# Patient Record
Sex: Female | Born: 1965 | Race: Black or African American | Hispanic: No | Marital: Married | Smoking: Never smoker
Health system: Southern US, Community
[De-identification: ages and names within clinical notes are randomized; demographics above are authoritative.]

## PROBLEM LIST (undated history)

## (undated) DIAGNOSIS — E669 Obesity, unspecified: Principal | ICD-10-CM

## (undated) DIAGNOSIS — I10 Essential (primary) hypertension: Secondary | ICD-10-CM

## (undated) DIAGNOSIS — R7611 Nonspecific reaction to tuberculin skin test without active tuberculosis: Secondary | ICD-10-CM

## (undated) DIAGNOSIS — E1169 Type 2 diabetes mellitus with other specified complication: Principal | ICD-10-CM

## (undated) DIAGNOSIS — E039 Hypothyroidism, unspecified: Secondary | ICD-10-CM

## (undated) DIAGNOSIS — T7840XA Allergy, unspecified, initial encounter: Secondary | ICD-10-CM

## (undated) HISTORY — DX: Hypothyroidism, unspecified: E03.9

## (undated) HISTORY — DX: Nonspecific reaction to tuberculin skin test without active tuberculosis: R76.11

## (undated) HISTORY — DX: Essential (primary) hypertension: I10

## (undated) HISTORY — DX: Allergy, unspecified, initial encounter: T78.40XA

## (undated) HISTORY — DX: Type 2 diabetes mellitus with other specified complication: E11.69

## (undated) HISTORY — DX: Obesity, unspecified: E66.9

---

## 1993-02-13 HISTORY — PX: TONSILLECTOMY: SUR1361

## 1997-02-13 HISTORY — PX: CHOLECYSTECTOMY: SHX55

## 1998-04-29 ENCOUNTER — Other Ambulatory Visit: Admission: RE | Admit: 1998-04-29 | Discharge: 1998-04-29 | Payer: Self-pay | Admitting: *Deleted

## 1999-07-12 ENCOUNTER — Other Ambulatory Visit: Admission: RE | Admit: 1999-07-12 | Discharge: 1999-07-12 | Payer: Self-pay | Admitting: *Deleted

## 2000-07-18 ENCOUNTER — Other Ambulatory Visit: Admission: RE | Admit: 2000-07-18 | Discharge: 2000-07-18 | Payer: Self-pay | Admitting: *Deleted

## 2001-07-15 ENCOUNTER — Other Ambulatory Visit: Admission: RE | Admit: 2001-07-15 | Discharge: 2001-07-15 | Payer: Self-pay | Admitting: *Deleted

## 2002-09-08 ENCOUNTER — Other Ambulatory Visit: Admission: RE | Admit: 2002-09-08 | Discharge: 2002-09-08 | Payer: Self-pay | Admitting: *Deleted

## 2003-11-20 ENCOUNTER — Emergency Department (HOSPITAL_COMMUNITY): Admission: EM | Admit: 2003-11-20 | Discharge: 2003-11-20 | Payer: Self-pay

## 2004-05-13 ENCOUNTER — Ambulatory Visit (HOSPITAL_COMMUNITY): Admission: RE | Admit: 2004-05-13 | Discharge: 2004-05-13 | Payer: Self-pay | Admitting: *Deleted

## 2005-10-13 ENCOUNTER — Encounter: Admission: RE | Admit: 2005-10-13 | Discharge: 2005-10-13 | Payer: Self-pay | Admitting: Obstetrics & Gynecology

## 2006-04-20 ENCOUNTER — Ambulatory Visit (HOSPITAL_COMMUNITY): Admission: RE | Admit: 2006-04-20 | Discharge: 2006-04-20 | Payer: Self-pay | Admitting: Internal Medicine

## 2006-10-24 ENCOUNTER — Encounter: Admission: RE | Admit: 2006-10-24 | Discharge: 2006-10-24 | Payer: Self-pay | Admitting: Obstetrics & Gynecology

## 2007-06-29 ENCOUNTER — Emergency Department (HOSPITAL_COMMUNITY): Admission: EM | Admit: 2007-06-29 | Discharge: 2007-06-29 | Payer: Self-pay | Admitting: Emergency Medicine

## 2007-11-19 ENCOUNTER — Encounter: Admission: RE | Admit: 2007-11-19 | Discharge: 2007-11-19 | Payer: Self-pay | Admitting: Obstetrics & Gynecology

## 2007-12-13 ENCOUNTER — Emergency Department (HOSPITAL_COMMUNITY): Admission: EM | Admit: 2007-12-13 | Discharge: 2007-12-13 | Payer: Self-pay | Admitting: Emergency Medicine

## 2008-08-02 ENCOUNTER — Emergency Department (HOSPITAL_COMMUNITY): Admission: EM | Admit: 2008-08-02 | Discharge: 2008-08-02 | Payer: Self-pay | Admitting: Emergency Medicine

## 2008-10-08 LAB — HM PAP SMEAR

## 2008-10-08 LAB — CONVERTED CEMR LAB: Pap Smear: NORMAL

## 2008-10-25 ENCOUNTER — Emergency Department (HOSPITAL_COMMUNITY): Admission: EM | Admit: 2008-10-25 | Discharge: 2008-10-25 | Payer: Self-pay | Admitting: Emergency Medicine

## 2008-11-30 LAB — HM MAMMOGRAPHY: HM Mammogram: NORMAL

## 2008-12-14 ENCOUNTER — Encounter: Admission: RE | Admit: 2008-12-14 | Discharge: 2008-12-14 | Payer: Self-pay | Admitting: Obstetrics & Gynecology

## 2008-12-30 ENCOUNTER — Ambulatory Visit: Payer: Self-pay | Admitting: Family Medicine

## 2008-12-30 DIAGNOSIS — M7500 Adhesive capsulitis of unspecified shoulder: Secondary | ICD-10-CM | POA: Insufficient documentation

## 2008-12-31 ENCOUNTER — Encounter: Payer: Self-pay | Admitting: Family Medicine

## 2008-12-31 DIAGNOSIS — I10 Essential (primary) hypertension: Secondary | ICD-10-CM | POA: Insufficient documentation

## 2008-12-31 DIAGNOSIS — J309 Allergic rhinitis, unspecified: Secondary | ICD-10-CM | POA: Insufficient documentation

## 2009-02-04 ENCOUNTER — Encounter: Payer: Self-pay | Admitting: Family Medicine

## 2009-02-08 ENCOUNTER — Ambulatory Visit: Payer: Self-pay | Admitting: Family Medicine

## 2009-04-05 ENCOUNTER — Encounter: Payer: Self-pay | Admitting: Family Medicine

## 2009-04-07 ENCOUNTER — Ambulatory Visit: Payer: Self-pay | Admitting: Family Medicine

## 2009-05-05 ENCOUNTER — Ambulatory Visit: Payer: Self-pay | Admitting: Family Medicine

## 2009-05-05 DIAGNOSIS — R5383 Other fatigue: Secondary | ICD-10-CM | POA: Insufficient documentation

## 2009-05-05 LAB — CONVERTED CEMR LAB
Free T4: 1 ng/dL
T4, Total: 7.2 ug/dL

## 2009-05-10 LAB — CONVERTED CEMR LAB
ALT: 17 units/L (ref 0–35)
AST: 18 units/L (ref 0–37)
Albumin: 4 g/dL (ref 3.5–5.2)
Alkaline Phosphatase: 74 units/L (ref 39–117)
BUN: 9 mg/dL (ref 6–23)
Basophils Absolute: 0.1 10*3/uL (ref 0.0–0.1)
Basophils Relative: 1.1 % (ref 0.0–3.0)
Bilirubin, Direct: 0.1 mg/dL (ref 0.0–0.3)
CO2: 28 meq/L (ref 19–32)
Calcium: 9.2 mg/dL (ref 8.4–10.5)
Chloride: 103 meq/L (ref 96–112)
Creatinine, Ser: 0.9 mg/dL (ref 0.4–1.2)
Direct LDL: 145.7 mg/dL
Eosinophils Absolute: 0.1 10*3/uL (ref 0.0–0.7)
Eosinophils Relative: 1.8 % (ref 0.0–5.0)
GFR calc non Af Amer: 87.6 mL/min (ref >60)
Glucose, Bld: 85 mg/dL (ref 70–99)
HCT: 41.5 % (ref 36.0–46.0)
Hemoglobin: 13.4 g/dL (ref 12.0–15.0)
Lymphocytes Relative: 44.5 % (ref 12.0–46.0)
Lymphs Abs: 2.7 10*3/uL (ref 0.7–4.0)
MCHC: 32.4 g/dL (ref 30.0–36.0)
MCV: 83.9 fL (ref 78.0–100.0)
Monocytes Absolute: 0.2 10*3/uL (ref 0.1–1.0)
Monocytes Relative: 2.8 % — ABNORMAL LOW (ref 3.0–12.0)
Neutro Abs: 3 10*3/uL (ref 1.4–7.7)
Neutrophils Relative %: 49.8 % (ref 43.0–77.0)
Platelets: 248 10*3/uL (ref 150.0–400.0)
Potassium: 4.4 meq/L (ref 3.5–5.1)
RBC: 4.95 M/uL (ref 3.87–5.11)
RDW: 12.6 % (ref 11.5–14.6)
Sodium: 140 meq/L (ref 135–145)
TSH: 0.29 microintl units/mL — ABNORMAL LOW (ref 0.35–5.50)
Total Bilirubin: 0.3 mg/dL (ref 0.3–1.2)
Total Protein: 7.5 g/dL (ref 6.0–8.3)
WBC: 6.1 10*3/uL (ref 4.5–10.5)

## 2009-05-18 ENCOUNTER — Encounter: Payer: Self-pay | Admitting: Family Medicine

## 2009-06-28 ENCOUNTER — Encounter: Payer: Self-pay | Admitting: Family Medicine

## 2009-08-18 ENCOUNTER — Ambulatory Visit: Payer: Self-pay | Admitting: Family Medicine

## 2009-08-18 DIAGNOSIS — J069 Acute upper respiratory infection, unspecified: Secondary | ICD-10-CM | POA: Insufficient documentation

## 2009-08-19 ENCOUNTER — Encounter: Payer: Self-pay | Admitting: Family Medicine

## 2009-10-12 ENCOUNTER — Emergency Department (HOSPITAL_COMMUNITY): Admission: EM | Admit: 2009-10-12 | Discharge: 2009-10-12 | Payer: Self-pay | Admitting: Family Medicine

## 2010-01-10 ENCOUNTER — Encounter: Admission: RE | Admit: 2010-01-10 | Discharge: 2010-01-10 | Payer: Self-pay | Admitting: Obstetrics & Gynecology

## 2010-01-14 ENCOUNTER — Ambulatory Visit: Payer: Self-pay | Admitting: Family Medicine

## 2010-03-17 NOTE — Assessment & Plan Note (Signed)
Summary: SINUS INFECTION/DLO   Vital Signs:  Patient profile:   45 year old female Height:      73 inches Weight:      258.2 pounds BMI:     34.19 Temp:     98.9 degrees F oral Pulse rate:   76 / minute Pulse rhythm:   regular BP sitting:   110 / 80  (left arm) Cuff size:   large  Vitals Entered By: Benny Lennert CMA Duncan Dull) (August 18, 2009 4:02 PM)  History of Present Illness: Chief complaint ? sinus infection  Acute Visit History:      The patient complains of headache, nasal discharge, sinus problems, and vomiting.  These symptoms began 2 days ago.  She denies chest pain, cough, earache, and fever.  Other comments include: Congestion right nostril  posyt nasal drip and nausea...clearing throat causes vomit of mucus.  using tylenol .        She complains of sinus pressure and nasal congestion.        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 Take One Tablet Once Daily 2)  Clarithromycin 500 Mg Tabs (Clarithromycin) .Marland Kitchen.. 1 Tab By Mouth Two Times A Day X 10 Days Fill If Not Improving in 3-4 Days.  Void After 09/13/2009  Allergies: 1)  ! Penicillin  Past History:  Past medical, surgical, family and social histories (including risk factors) reviewed, and no changes noted (except as noted below).  Past Medical History: Reviewed history from 12/30/2008 and no changes required. + PPD, has had 9 months of treatment Hypothyroid? Allergic rhinitis Hypertension  Past Surgical History: Reviewed history from 12/30/2008 and no changes required. Cholecystectomy, 1999 Tonsillectomy, 1995  Family History: Reviewed history from 12/30/2008 and no  changes required. Alcoholism, Drug Addiction: 0 Colon CA: 0 Ovarian/Uterine CA: 0 Breast CA: 0 Lung CA: 0 Prostate CA: 0 CAD: 0 CVA: GP Sudden death < 50: 0 DM: P, GP Mental Illness: 0   Social History: Reviewed history from 12/30/2008 and no changes required. Clerical accounting tech married  Review of Systems General:  Complains of fatigue; denies fever. CV:  Denies chest pain or discomfort. Resp:  Denies shortness of breath.  Physical Exam  General:  Obese appearing female inNAD  Head:  ttp B maxillary sinuses Ears:  External ear exam shows no significant lesions or deformities.  Otoscopic examination reveals clear canals, tympanic membranes are intact bilaterally without bulging, retraction, inflammation or discharge. Hearing is grossly normal bilaterally. Nose:  Nasal turbinates swollen, no purulent discharge.  Mouth:  Oral mucosa and oropharynx without lesions or exudates.  Teeth in good repair. Neck:  no carotid bruit or thyromegaly no cervical or supraclavicular lymphadenopathy  Lungs:  Normal respiratory effort, chest expands symmetrically. Lungs are clear to auscultation, no crackles or wheezes. Heart:  Normal rate and regular rhythm. S1 and S2 normal without gallop, murmur, click, rub or other extra sounds.   Impression & Recommendations:  Problem # 1:  URI (ICD-465.9) Treat symptomatically...with mucinex, nasal saline and tylenol.  Given she is going out of town..provided antibiotic Rx...if not improving..treat for bacterial sinus infection.  Complete Medication List: 1)  Benazepril Hcl 40 Mg  Tabs (Benazepril hcl) .... Take one tablet once daily 2)  Clarithromycin 500 Mg Tabs (Clarithromycin) .Marland Kitchen.. 1 tab by mouth two times a day x 10 days fill if not improving in 3-4 days.  void after 09/13/2009  Patient Instructions: 1)  Nasal saline  spray in nostril three times a day  2)  Avoid decongestants. 3)  Start mucinex (guafenesin  600- 1200mg   ) two times a day  . 4)  Tylenol  650 mg three times a day as needed for headache. 5)   Fill presention fro antibitocs if not improving in 5-7 days.  Prescriptions: CLARITHROMYCIN 500 MG TABS (CLARITHROMYCIN) 1 tab by mouth two times a day x 10 days Fill if not improving in 3-4 days.  VOID after 09/13/2009  #20 x 0   Entered and Authorized by:   Kerby Nora MD   Signed by:   Kerby Nora MD on 08/18/2009   Method used:   Print then Give to Patient   RxID:   [id number]   Current Allergies (reviewed today): ! PENICILLIN

## 2010-03-17 NOTE — Assessment & Plan Note (Signed)
Summary: ROA 2 MTHS CYD   Vital Signs:  Patient profile:   45 year old female Height:      73 inches Weight:      257.50 pounds BMI:     34.10 Temp:     98.5 degrees F oral Pulse rate:   76 / minute Pulse rhythm:   regular BP sitting:   122 / 80  (left arm) Cuff size:   large  Vitals Entered By: Linde Gillis CMA Duncan Dull) (April 07, 2009 3:34 PM) CC: 2 month follow up   History of Present Illness: Chief complaint f/u AC  f/u L adhesive capsulitis, moderately compliant with HEP  Doing some rehab twice a week and doing some HEP  Left shoulder, adhesive capsulitis: injured shoulder, but has intervally improved with PT and HEP  she has loss of range of motion in all directions in her affected LEFT shoulder.   Reviewed her HEP, not following the [facility] program that I gave her, but is doing some work a few times a week.   Allergies: 1)  ! Penicillin  Past History:  Past medical, surgical, family and social histories (including risk factors) reviewed, and no changes noted (except as noted below).  Past Medical History: Reviewed history from 12/30/2008 and no changes required. + PPD, has had 9 months of treatment Hypothyroid? Allergic rhinitis Hypertension  Past Surgical History: Reviewed history from 12/30/2008 and no changes required. Cholecystectomy, 1999 Tonsillectomy, 1995  Family History: Reviewed history from 12/30/2008 and no changes required. Alcoholism, Drug Addiction: 0 Colon CA: 0 Ovarian/Uterine CA: 0 Breast CA: 0 Lung CA: 0 Prostate CA: 0 CAD: 0 CVA: GP Sudden death < 50: 0 DM: P, GP Mental Illness: 0   Social History: Reviewed history from 12/30/2008 and no changes required. Production designer, theatre/television/film tech married  Review of Systems       REVIEW OF SYSTEMS  GEN: No systemic complaints, no fevers, chills, sweats, or other acute illnesses MSK: Detailed in the HPI GI: tolerating PO intake without difficulty Neuro: No numbness, parasthesias,  or tingling associated. Otherwise the pertinent positives of the ROS are noted above.    Physical Exam  General:  GEN: Well-developed,well-nourished,in no acute distress; alert,appropriate and cooperative throughout examination HEENT: Normocephalic and atraumatic without obvious abnormalities. No apparent alopecia or balding. Ears, externally no deformities PULM: Breathing comfortably in no respiratory distress EXT: No clubbing, cyanosis, or edema PSYCH: Normally interactive. Cooperative during the interview. Pleasant. Friendly and conversant. Not anxious or depressed appearing. Normal, full affect.  Msk:  RIGHT shoulder: full range of motion, strength intact, examination normal.  LEFT shoulder: Nontender along clavicle, nontender acromioclavicular joint. Nontender at the supraspinatus insertion. Nontender at the bicipital groove. No bruising, no edema.  loss of motion in all directions. Abduction to approximately 150. Loss of 80 of internal range of motion. Loss of 40 of external range of motion. Done at 90 of abduction   Impression & Recommendations:  Problem # 1:  FROZEN SHOULDER (ICD-726.0) Mild improvement from last time. Family sick with flu last week, minimal HEP  I reviewed with her daily HEP for frozen shoulder and reviewed the full [facility] program showing her all exercises. If no improvement in next office visit, consideration of manipulation under anesthesia?  Complete Medication List: 1)  Benazepril Hcl 40 Mg Tabs (Benazepril hcl) .... Take one tablet once daily  Current Allergies (reviewed today): ! PENICILLIN

## 2010-03-17 NOTE — Assessment & Plan Note (Signed)
Summary: CPX//CYD   Vital Signs:  Patient profile:   45 year old female Height:      73 inches Weight:      258.2 pounds BMI:     34.19 Temp:     98.5 degrees F oral Pulse rate:   76 / minute Pulse rhythm:   regular BP sitting:   118 / 78  (left arm) Cuff size:   large  Vitals Entered By: Benny Lennert CMA Duncan Dull) (May 05, 2009 2:38 PM)  History of Present Illness: Chief complaint cpx has gyn  Pap - done Breast exam - done up-to-date and done by gynecology  Mammo - up to date  Chol: Needs lab work Tdap - 2006  History of abnormal  TSH, repeat needed  L frozen shoulder: oonset since September, the patient did make some initial progress, but has had some  continued plateau  without improvement over the last couple months after she has been going to physical therapy and doing home exercise program. She has had one shoulder injection.  Weight loss: Likes to pay some softball, but not doing anything now.     Preventive Screening-Counseling & Management  Alcohol-Tobacco     Alcohol drinks/day: 0     Alcohol Counseling: not indicated; patient does not drink     Smoking Status: never     Tobacco Counseling: not indicated; no tobacco use  Caffeine-Diet-Exercise     Diet Counseling: to improve diet; diet is suboptimal     Does Patient Exercise: no     Exercise Counseling: to improve exercise regimen  Hep-HIV-STD-Contraception     STD Risk: no risk noted      Sexual History:  currently monogamous.        Drug Use:  never.    Allergies: 1)  ! Penicillin  Past History:  Past medical, surgical, family and social histories (including risk factors) reviewed, and no changes noted (except as noted below).  Past Medical History: Reviewed history from 12/30/2008 and no changes required. + PPD, has had 9 months of treatment Hypothyroid? Allergic rhinitis Hypertension  Past Surgical History: Reviewed history from 12/30/2008 and no changes  required. Cholecystectomy, 1999 Tonsillectomy, 1995  Family History: Reviewed history from 12/30/2008 and no changes required. Alcoholism, Drug Addiction: 0 Colon CA: 0 Ovarian/Uterine CA: 0 Breast CA: 0 Lung CA: 0 Prostate CA: 0 CAD: 0 CVA: GP Sudden death < 50: 0 DM: P, GP Mental Illness: 0   Social History: Reviewed history from 12/30/2008 and no changes required. 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  Cardiovascular: Denies chest pains, palpitations, syncope, dyspnea on exertion,  Respiratory: Denies cough, dyspnea at rest, excessive sputum,wheeezing GI: Denies nausea, vomiting, diarrhea, constipation, change in bowel habits, abdominal pain, melena, hematochezia GU: Denies dysuria, hematuria, discharge, urinary frequency, urinary hesitancy, nocturia, incontinence, genital sores, decreased libido Musculoskeletal: as above Derm: Denies rash, itching Neuro: Denies  paresthesias, frequent falls, frequent headaches, and difficulty walking.  Psych: Denies depression, anxiety Endocrine: Denies cold intolerance, heat intolerance, polydipsia, polyphagia, polyuria, and unusual weight change.  Heme: Denies enlarged lymph nodes Allergy: No hayfever   Otherwise, the pertinent positives and negatives are listed above and in the HPI, otherwise a full review  of systems has been reviewed and is negative unless noted positive.   Physical Exam  General:  Well-developed,well-nourished,in no acute distress; alert,appropriate and cooperative throughout examination Head:  Normocephalic and atraumatic without obvious abnormalities. No apparent alopecia or  balding. Eyes:  vision grossly intact, pupils equal, pupils round, pupils reactive to light, and pupils react to accomodation.   Ears:  External ear exam shows no significant lesions or deformities.  Otoscopic examination reveals clear canals, tympanic membranes are intact bilaterally without bulging, retraction, inflammation or discharge. Hearing is grossly normal bilaterally. Nose:  External nasal examination shows no deformity or inflammation. Nasal mucosa are pink and moist without lesions or exudates. Mouth:  Oral mucosa and oropharynx without lesions or exudates.  Teeth in good repair. Neck:  No deformities, masses, or tenderness noted. Chest Wall:  No deformities, masses, or tenderness noted. Lungs:  Normal respiratory effort, chest expands symmetrically. Lungs are clear to auscultation, no crackles or wheezes. Heart:  Normal rate and regular rhythm. S1 and S2 normal without gallop, murmur, click, rub or other extra sounds. Abdomen:  Bowel sounds positive,abdomen soft and non-tender without masses, organomegaly or hernias noted. Msk:  normal ROM and no crepitation.   Extremities:  No clubbing, cyanosis, edema, or deformity noted with normal full range of motion of all joints.   Neurologic:  alert & oriented X3, sensation intact to light touch, and gait normal.   Skin:  Intact without suspicious lesions or rashes Cervical Nodes:  No lymphadenopathy noted Psych:  Cognition and judgment appear intact. Alert and cooperative with normal attention span and concentration. No apparent delusions, illusions, hallucinations   Impression & Recommendations:  Problem # 1:  HEALTH MAINTENANCE EXAM (ICD-V70.0) The patient's preventative maintenance and recommended screening tests for an annual wellness exam were reviewed in full today. Brought up to date unless services declined.  Counselled on the importance of diet, exercise, and its role in overall health and mortality. The patient's FH and SH was  reviewed, including their home life, tobacco status, and drug and alcohol status.   adhesive capsulitis, the patient has affectively had a  failure of conservative management with a plateau  doing home exercise programs with frozen shoulder protocol from [facility],, aggressive physical therapy, and she is at one shoulder injection.  I think at this point, given the timeframe, and her plateau without any significant improvement and still with significant deficit in a young woman, orthopedic consult is appropriate. I appreciate their assistance in consideration for definitive management.  Complete Medication List: 1)  Benazepril Hcl 40 Mg Tabs (Benazepril hcl) .... Take one tablet once daily  Other Orders: Venipuncture (04540) TLB-TSH (Thyroid Stimulating Hormone) (84443-TSH) TLB-CBC Platelet - w/Differential (85025-CBCD) TLB-Hepatic/Liver Function Pnl (80076-HEPATIC) TLB-BMP (Basic Metabolic Panel-BMET) (80048-METABOL) TLB-Cholesterol, Direct LDL (83721-DIRLDL) Orthopedic Surgeon Referral (Ortho Surgeon)  Patient Instructions: 1)  Referral Appointment Information 2)  Day/Date: 3)  Time: 4)  Place/MD: 5)  Address: 6)  Phone/Fax: 7)  Patient given appointment information. Information/Orders faxed/mailed.  Prescriptions: BENAZEPRIL HCL 40 MG TABS (BENAZEPRIL HCL) take one tablet once daily  #30 x 11   Entered and Authorized by:   Hannah Beat MD   Signed by:   Hannah Beat MD on 05/05/2009   Method used:   Print then Give to Patient   RxID:   [id number]   Current Allergies (reviewed today): ! PENICILLIN  TD Result Date:  02/14/2004 TD Result:  given

## 2010-03-17 NOTE — Miscellaneous (Signed)
Summary: [facility] Note/[facility]  PT Note/[facility]   Imported By: Lanelle Bal 04/07/2009 13:20:33  _____________________________________________________________________  External Attachment:    Type:   Image     Comment:   External Document

## 2010-03-17 NOTE — Letter (Signed)
Summary: Out of Work  [employer] [employer] [facility] at [facility]  [address] [place], [place] [address]   Phone: [phone]  Fax: [phone]    January 14, 2010   Employee:  LEESA LEIFHEIT    To Whom It May Concern:   For Medical reasons, please excuse the above named employee from work from today until cough resolved.  Potentially contagious.   If you need additional information, please feel free to contact our office.         Sincerely,    Crawford Givens MD

## 2010-03-17 NOTE — Letter (Signed)
Summary: [facility]  [facility]   Imported By: Lanelle Bal 08/26/2009 11:52:39  _____________________________________________________________________  External Attachment:    Type:   Image     Comment:   External Document

## 2010-03-17 NOTE — Assessment & Plan Note (Signed)
Summary: CONGESTION RUNNY NOSE COUGH/MK   Vital Signs:  Patient profile:   45 year old female Height:      73 inches Weight:      263.75 pounds BMI:     34.92 Temp:     99 degrees F oral Pulse rate:   76 / minute Pulse rhythm:   regular BP sitting:   116 / 60  (left arm) Cuff size:   large  Vitals Entered By: Delilah Shan CMA Duncan Dull) (January 14, 2010 2:15 PM) CC: Congestion, runny nose, cough.   Needs note for work.   History of Present Illness: Started with rhinorrhea after shopping on Friday after Thanksgiving.  Was using cough drops with minimal relief.  Cough during the day, worse at night.   Episode of nausea early in this AM.  Had been vomiting early this AM, vomitus = drainage.  Dec in appetite.  Drink clear fluids.  Some sputum.  Some chills after vomiting. Not much ST.  No HA.    Had flu shot 10/2009.    Allergies: 1)  ! Penicillin 2)  ! Clarithromycin (Clarithromycin)  Social History: Geophysical data processor married Works at [employer]   Review of Systems       See HPI.  Otherwise negative.    Physical Exam  General:  GEN: nad, alert and oriented HEENT: mucous membranes moist, TM w/o erythema, nasal epithelium injected, OP with cobblestoning NECK: supple w/o LA CV: rrr. PULM: ctab, no inc wob ABD: soft, +bs EXT: no edema    Impression & Recommendations:  Problem # 1:  URI (ICD-465.9) Likely viral and no indication for antibiotics.  Supportive tx and follow up as needed.  Nontoxic.  She agrees.  Sedation caution for meds.  GI upset likely due to drainage that has been swallowed and should improve.   Her updated medication list for this problem includes:    Hydromet 5-1.5 Mg/14ml Syrp (Hydrocodone-homatropine) .Marland KitchenMarland KitchenMarland KitchenMarland Kitchen 5 ml by mouth q6h as needed for cough, sedation caution  Complete Medication List: 1)  Benazepril Hcl 40 Mg Tabs (Benazepril hcl) .... Take one tablet once daily 2)  Zofran 4 Mg Tabs (Ondansetron hcl) .Marland Kitchen.. 1 by mouth three  times a day as needed for nausea and vomiting 3)  Hydromet 5-1.5 Mg/58ml Syrp (Hydrocodone-homatropine) .... 5 ml by mouth q6h as needed for cough, sedation caution  Patient Instructions: 1)  Get plenty of rest, drink lots of clear liquids, and use Tylenol for fever and comfort. Use the cough medicine as needed- it can make you drowsy.  Use the zofran as needed for nausea.  Take care.  Prescriptions: HYDROMET 5-1.5 MG/5ML SYRP (HYDROCODONE-HOMATROPINE) 5 ml by mouth q6h as needed for cough, sedation caution  #6oz x 0   Entered and Authorized by:   Crawford Givens MD   Signed by:   Crawford Givens MD on 01/14/2010   Method used:   Print then Give to Patient   RxID:   [id number] ZOFRAN 4 MG TABS (ONDANSETRON HCL) 1 by mouth three times a day as needed for nausea and vomiting  #20 x 1   Entered and Authorized by:   Crawford Givens MD   Signed by:   Crawford Givens MD on 01/14/2010   Method used:   Print then Give to Patient   RxID:   [id number]    Orders Added: 1)  Est. Patient Level III [54008]    Current Allergies (reviewed today): ! PENICILLIN ! CLARITHROMYCIN (CLARITHROMYCIN)

## 2010-03-17 NOTE — Miscellaneous (Signed)
Summary: PT Initial Note/[facility]  PT Initial Note/[facility]    Imported By: Lanelle Bal 03/31/2009 12:48:01  _____________________________________________________________________  External Attachment:    Type:   Image     Comment:   External Document

## 2010-03-17 NOTE — Miscellaneous (Signed)
Summary: PT Discharge/[facility] [place]  PT Discharge/[facility] [place]   Imported By: Lanelle Bal 07/01/2009 10:21:21  _____________________________________________________________________  External Attachment:    Type:   Image     Comment:   External Document

## 2010-03-18 NOTE — Letter (Signed)
Summary: [facility]  [facility]   Imported By: Lanelle Bal 07/06/2009 09:44:09  _____________________________________________________________________  External Attachment:    Type:   Image     Comment:   External Document

## 2010-05-20 LAB — POCT URINALYSIS DIP (DEVICE)
Bilirubin Urine: NEGATIVE
Glucose, UA: NEGATIVE mg/dL
Ketones, ur: NEGATIVE mg/dL
Nitrite: NEGATIVE
Protein, ur: NEGATIVE mg/dL
Specific Gravity, Urine: 1.015 (ref 1.005–1.030)
Urobilinogen, UA: 0.2 mg/dL (ref 0.0–1.0)
pH: 7 (ref 5.0–8.0)

## 2010-06-09 ENCOUNTER — Ambulatory Visit (INDEPENDENT_AMBULATORY_CARE_PROVIDER_SITE_OTHER): Payer: Federal, State, Local not specified - PPO | Admitting: Family Medicine

## 2010-06-09 ENCOUNTER — Encounter: Payer: Self-pay | Admitting: Family Medicine

## 2010-06-09 VITALS — BP 126/94 | HR 88 | Temp 99.1°F | Wt 267.1 lb

## 2010-06-09 DIAGNOSIS — I10 Essential (primary) hypertension: Secondary | ICD-10-CM

## 2010-06-09 LAB — BASIC METABOLIC PANEL
BUN: 10 mg/dL (ref 6–23)
CO2: 26 mEq/L (ref 19–32)
Calcium: 9.4 mg/dL (ref 8.4–10.5)
Chloride: 104 mEq/L (ref 96–112)
Creatinine, Ser: 0.9 mg/dL (ref 0.4–1.2)
GFR: 90.64 mL/min (ref >60.00)
Glucose, Bld: 99 mg/dL (ref 70–99)
Potassium: 4.2 mEq/L (ref 3.5–5.1)
Sodium: 138 mEq/L (ref 135–145)

## 2010-06-09 MED ORDER — BENAZEPRIL HCL 40 MG PO TABS: 20.0000 mg | ORAL_TABLET | Freq: Every day | ORAL | Status: DC

## 2010-06-09 NOTE — Assessment & Plan Note (Addendum)
Dec the ACE by half and check BP at home.  Check bmet today and notify pt.  She has been exercising and she may just need less BP medicine.  D/w pt.  Okay for outpatient fu.  She is aware of category x for ACE in pregnancy.

## 2010-06-09 NOTE — Progress Notes (Signed)
For last few days her fingers have been swelling.  Happened on both hands.  Had felt a little lightheaded, intermittent, sometimes more noticeable after standing.  Doesn't feel like working out due to the lightheaded feeling.  She'll get nauseated with the lightheadedness.  Some swelling in ankles noted by patient.  No FCNAVD.  Not sob.  No CP.  No cough.  No syncope.  Drinking plenty of water.  Had checked BP pharmacy, but not recently.    Meds, vitals, and allergies reviewed.   ROS: See HPI.  Otherwise, noncontributory.  GEN: nad, alert and oriented HEENT: mucous membranes moist NECK: supple w/o LA CV: rrr PULM: ctab, no inc wob ABD: soft, +bs EXT: trace edema bilaterally SKIN: no acute rash

## 2010-06-09 NOTE — Patient Instructions (Signed)
Get a pill cutter at the pharmacy and take 1/2 tab a day of the benazepril.   Get a BP cuff and check your pressure.  Call back with an update on the swelling, lightheadedness, and BP readings next week. You can get your results through our phone system.  Follow the instructions on the blue card. Glad to see you today.

## 2010-07-25 ENCOUNTER — Other Ambulatory Visit: Payer: Self-pay | Admitting: Family Medicine

## 2010-08-15 ENCOUNTER — Encounter: Payer: Self-pay | Admitting: *Deleted

## 2010-08-15 ENCOUNTER — Encounter: Payer: Self-pay | Admitting: Family Medicine

## 2010-08-15 ENCOUNTER — Ambulatory Visit (INDEPENDENT_AMBULATORY_CARE_PROVIDER_SITE_OTHER): Payer: Federal, State, Local not specified - PPO | Admitting: Family Medicine

## 2010-08-15 VITALS — BP 140/80 | HR 88 | Temp 99.0°F | Ht 73.0 in | Wt 264.1 lb

## 2010-08-15 DIAGNOSIS — J069 Acute upper respiratory infection, unspecified: Secondary | ICD-10-CM

## 2010-08-15 MED ORDER — BENZONATATE 100 MG PO CAPS: 100.0000 mg | ORAL_CAPSULE | Freq: Three times a day (TID) | ORAL | Status: AC | PRN

## 2010-08-15 MED ORDER — HYDROCODONE-HOMATROPINE 5-1.5 MG/5ML PO SYRP: ORAL_SOLUTION | ORAL | Status: AC

## 2010-08-15 NOTE — Patient Instructions (Signed)
Upper Respiratory Infection -Viral Infections  TREATMENT 1. Drink plenty of fluids, but limit caffeine 2. Decongestant: for congested noses, sinuses, and ear tubes. Pressure release and help drainage: Sudafed (pseudephedrine or Phenylephrine) (NOT IF YOU HAVE HIGH BLOOD PRESSURE) 3. Nasal Sprays: Relieve pressure, promote drainage, open nasal and ear passages. Afrin or Neosynephrine can be used for only 3-4 days in row. 4. Nasal Saline: Moisten and smooth membranes, no side effects 5. Cough Suppressants: Several types over the counter such as DM. Codeine and Hydrocodone (Like the Hycodan I am giving you) are prescription narcotic medicines that are powerful cough suppressants. DM cough suppressant usually are well tolerated with minimal side effects Tessalon perles during day for cough and numbs throat some  Chloraseptic if throat sore  YOUR BODY HAS TO HEAL ITSELF.

## 2010-08-15 NOTE — Progress Notes (Signed)
Patent presents with runny nose, sneezing, cough, sore throat, malaise and minimal / low-grade fever .   At [place] last week: recent exposure to others with similar symptoms.   The patent denies sore throat as the primary complaint. Denies sthortness of breath/wheezing, high fever, chest pain, rhinits for more than 14 days, significant myalgia, otalgia, facial pain, abdominal pain, changes in bowel or bladder.  PMH, PHS, Allergies, Problem List, Medications, Family History, and Social History have all been reviewed.  ROS: as above, eating and drinking - tolerating PO. Urinating normally. No excessive vomitting or diarrhea. O/w as above.  PHYSICAL EXAM  Blood pressure 140/80, pulse 88, temperature 99 F (37.2 C), temperature source Oral, height 6\' 1"  (1.854 m), weight 264 lb 1.9 oz (119.804 kg), SpO2 98.00%.  PE: GEN: WDWN, Non-toxic, Atraumatic, normocephalic. A and O x 3. HEENT: Oropharynx clear without exudate, MMM, no significant LAD, mild rhinnorhea Ears: TM clear, COL visualized with good landmarks CV: RRR, no m/g/r. Pulm: CTA B, no wheezes, rhonchi, or crackles, normal respiratory effort. EXT: no c/c/e Psych: well oriented, neither depressed nor anxious in appearance  A/P: 1. URI. Supportive care reviewed with patient. See patient instruction section.

## 2010-10-27 ENCOUNTER — Other Ambulatory Visit: Payer: Self-pay | Admitting: Family Medicine

## 2010-11-09 LAB — POCT URINALYSIS DIP (DEVICE)
Bilirubin Urine: NEGATIVE
Glucose, UA: NEGATIVE
Ketones, ur: NEGATIVE
Nitrite: NEGATIVE
Operator id: 282151
Protein, ur: 30 — AB
Specific Gravity, Urine: 1.01
Urobilinogen, UA: 0.2
pH: 7

## 2010-12-27 ENCOUNTER — Other Ambulatory Visit: Payer: Self-pay | Admitting: Obstetrics & Gynecology

## 2010-12-27 DIAGNOSIS — Z1231 Encounter for screening mammogram for malignant neoplasm of breast: Secondary | ICD-10-CM

## 2011-01-06 ENCOUNTER — Ambulatory Visit (INDEPENDENT_AMBULATORY_CARE_PROVIDER_SITE_OTHER): Payer: Federal, State, Local not specified - PPO | Admitting: Family Medicine

## 2011-01-06 ENCOUNTER — Encounter: Payer: Self-pay | Admitting: Family Medicine

## 2011-01-06 VITALS — BP 130/72 | HR 103 | Temp 98.8°F | Ht 74.0 in | Wt 275.4 lb

## 2011-01-06 DIAGNOSIS — J321 Chronic frontal sinusitis: Secondary | ICD-10-CM

## 2011-01-06 MED ORDER — LEVOFLOXACIN 500 MG PO TABS: 500.0000 mg | ORAL_TABLET | Freq: Every day | ORAL | Status: AC

## 2011-01-06 NOTE — Progress Notes (Signed)
  Patient Name: Carrie Ellis Date of Birth: 06/28/1965 Age: 45 y.o. Medical Record Number: [id number] Gender: female  History of Present Illness:  Carrie Ellis is a 45 y.o. very pleasant female patient who presents with the following:  Sinuses are draining a lot. Starting on Wednesday. Not breathing all that great. Left nostril was closed shut. As the day progressed had some let sided headace.  Fontal headache. Later on in the day, pounding headache. Now ear is hurting. Did some midnight shopping.   Left-sided frontal pain primarily, without any significant maxillary pain. She has had some purulent discharge. No bloody discharge. No significant tooth pain, but she also is having some pain in her year, all on the LEFT side.  Past Medical History, Surgical History, Social History, Family History, and Problem List have been reviewed in EHR and updated if relevant.  Review of Systems: ROS: GEN: Acute illness details above GI: Tolerating PO intake GU: maintaining adequate hydration and urination Pulm: No SOB Interactive and getting along well at home.  Otherwise, ROS is as per the HPI.   Physical Examination: Filed Vitals:   01/06/11 1239  BP: 130/72  Pulse: 103  Temp: 98.8 F (37.1 C)  TempSrc: Oral  Height: 6\' 2"  (1.88 m)  Weight: 275 lb 6.4 oz (124.921 kg)  SpO2: 100%     Gen: WDWN, NAD; alert,appropriate and cooperative throughout exam  HEENT: Normocephalic and atraumatic. Throat clear, w/o exudate, no LAD, R TM clear, L TM - good landmarks, No fluid present. rhinnorhea.  Left frontal and maxillary sinuses: Tender frontal Right frontal and maxillary sinuses: non-Tender  Neck: No ant or post LAD CV: RRR, No M/G/R Pulm: Breathing comfortably in no resp distress. no w/c/r Abd: S,NT,ND,+BS Extr: no c/c/e Psych: full affect, pleasant   Assessment and Plan: 1. Frontal sinusitis  levofloxacin (LEVAQUIN) 500 MG tablet    Acute sinusitis: ABX as  below.  Refer to the patient instructions sections for details of plan shared with patient.  Reviewed symptomatic care as well as ABX in this case.

## 2011-01-14 LAB — HM MAMMOGRAPHY: HM Mammogram: NORMAL

## 2011-01-19 ENCOUNTER — Ambulatory Visit
Admission: RE | Admit: 2011-01-19 | Discharge: 2011-01-19 | Disposition: A | Discharge status: Home / Self Care | Payer: Federal, State, Local not specified - PPO | Source: Ambulatory Visit | Attending: Obstetrics & Gynecology | Admitting: Obstetrics & Gynecology

## 2011-01-19 DIAGNOSIS — Z1231 Encounter for screening mammogram for malignant neoplasm of breast: Secondary | ICD-10-CM

## 2011-02-11 ENCOUNTER — Other Ambulatory Visit: Payer: Self-pay | Admitting: Family Medicine

## 2011-03-15 ENCOUNTER — Other Ambulatory Visit: Payer: Self-pay | Admitting: Family Medicine

## 2011-05-29 ENCOUNTER — Ambulatory Visit (INDEPENDENT_AMBULATORY_CARE_PROVIDER_SITE_OTHER): Payer: Federal, State, Local not specified - PPO | Admitting: Family Medicine

## 2011-05-29 ENCOUNTER — Encounter: Payer: Self-pay | Admitting: Family Medicine

## 2011-05-29 VITALS — BP 140/90 | HR 99 | Temp 98.9°F | Ht 73.0 in | Wt 271.8 lb

## 2011-05-29 DIAGNOSIS — J069 Acute upper respiratory infection, unspecified: Secondary | ICD-10-CM

## 2011-05-29 DIAGNOSIS — J301 Allergic rhinitis due to pollen: Secondary | ICD-10-CM

## 2011-05-29 NOTE — Patient Instructions (Signed)
Allegra, Zyrtec, or Claritin -- for allergies.  Plain mucinex.

## 2011-05-29 NOTE — Progress Notes (Signed)
  Patient Name: Carrie Ellis Date of Birth: 1965-12-29 Medical Record Number: [id number]  History of Present Illness:  Patent presents with runny nose, sneezing, cough, sore throat, malaise and minimal / low-grade fever .  Friday, eyes have been watering and ears and bothering her a lot. Coughing a lot at night. Has been coughing all night.   Took some mucinex to help with cong Then sneezing a lot. Then will shut back up a lot. Feeling really bad.  ? recent exposure to others with similar symptoms.   The patent denies sore throat as the primary complaint. Denies sthortness of breath/wheezing, high fever, chest pain, rhinits for more than 14 days, significant myalgia, otalgia, facial pain, abdominal pain, changes in bowel or bladder.  PMH, PHS, Allergies, Problem List, Medications, Family History, and Social History have all been reviewed.  Review of Systems: as above, eating and drinking - tolerating PO. Urinating normally. No excessive vomitting or diarrhea. O/w as above.  Physical Exam:  Filed Vitals:   05/29/11 1531  BP: 140/90  Pulse: 99  Temp: 98.9 F (37.2 C)  TempSrc: Oral  Height: 6\' 1"  (1.854 m)  Weight: 271 lb 12.8 oz (123.288 kg)  SpO2: 98%    GEN: WDWN, Non-toxic, Atraumatic, normocephalic. A and O x 3. HEENT: Oropharynx clear without exudate, MMM, no significant LAD, mild rhinnorhea Ears: TM clear, COL visualized with good landmarks CV: RRR, no m/g/r. Pulm: CTA B, no wheezes, rhonchi, or crackles, normal respiratory effort. EXT: no c/c/e Psych: well oriented, neither depressed nor anxious in appearance  A/P: 1. URI. Supportive care reviewed with patient. See patient instruction section. AR flare, start anti-H

## 2011-07-15 LAB — HM PAP SMEAR: HM Pap smear: NORMAL

## 2011-10-04 ENCOUNTER — Other Ambulatory Visit: Payer: Self-pay

## 2011-10-04 MED ORDER — BENAZEPRIL HCL 40 MG PO TABS: 40.0000 mg | ORAL_TABLET | Freq: Every day | ORAL | Status: DC

## 2011-10-04 NOTE — Telephone Encounter (Signed)
Pt request refill Benazepril sent to [facility] Pt advised sent refill. Pt has CPX scheduled 10/23/11.

## 2011-10-23 ENCOUNTER — Ambulatory Visit (INDEPENDENT_AMBULATORY_CARE_PROVIDER_SITE_OTHER): Payer: Federal, State, Local not specified - PPO | Admitting: Family Medicine

## 2011-10-23 ENCOUNTER — Encounter: Payer: Self-pay | Admitting: *Deleted

## 2011-10-23 ENCOUNTER — Encounter: Payer: Self-pay | Admitting: Family Medicine

## 2011-10-23 VITALS — BP 130/80 | HR 102 | Temp 98.9°F | Ht 73.0 in | Wt 276.8 lb

## 2011-10-23 DIAGNOSIS — R5381 Other malaise: Secondary | ICD-10-CM

## 2011-10-23 DIAGNOSIS — Z0000 Encounter for general adult medical examination without abnormal findings: Secondary | ICD-10-CM

## 2011-10-23 DIAGNOSIS — R5383 Other fatigue: Secondary | ICD-10-CM

## 2011-10-23 DIAGNOSIS — Z13220 Encounter for screening for lipoid disorders: Secondary | ICD-10-CM

## 2011-10-23 DIAGNOSIS — Z79899 Other long term (current) drug therapy: Secondary | ICD-10-CM

## 2011-10-23 LAB — HEPATIC FUNCTION PANEL
ALT: 17 U/L (ref 0–35)
AST: 14 U/L (ref 0–37)
Albumin: 3.7 g/dL (ref 3.5–5.2)
Alkaline Phosphatase: 65 U/L (ref 39–117)
Bilirubin, Direct: 0.1 mg/dL (ref 0.0–0.3)
Total Bilirubin: 0.5 mg/dL (ref 0.3–1.2)
Total Protein: 7.5 g/dL (ref 6.0–8.3)

## 2011-10-23 LAB — BASIC METABOLIC PANEL
BUN: 9 mg/dL (ref 6–23)
CO2: 24 mEq/L (ref 19–32)
Calcium: 8.7 mg/dL (ref 8.4–10.5)
Chloride: 106 mEq/L (ref 96–112)
Creatinine, Ser: 0.8 mg/dL (ref 0.4–1.2)
GFR: 100.7 mL/min (ref >60.00)
Glucose, Bld: 116 mg/dL — ABNORMAL HIGH (ref 70–99)
Potassium: 4.1 mEq/L (ref 3.5–5.1)
Sodium: 139 mEq/L (ref 135–145)

## 2011-10-23 LAB — CBC WITH DIFFERENTIAL/PLATELET
Basophils Absolute: 0 10*3/uL (ref 0.0–0.1)
Basophils Relative: 0.5 % (ref 0.0–3.0)
Eosinophils Absolute: 0.3 10*3/uL (ref 0.0–0.7)
Eosinophils Relative: 4.4 % (ref 0.0–5.0)
HCT: 41.3 % (ref 36.0–46.0)
Hemoglobin: 13.1 g/dL (ref 12.0–15.0)
Lymphocytes Relative: 32 % (ref 12.0–46.0)
Lymphs Abs: 2.4 10*3/uL (ref 0.7–4.0)
MCHC: 31.8 g/dL (ref 30.0–36.0)
MCV: 83.1 fl (ref 78.0–100.0)
Monocytes Absolute: 0.5 10*3/uL (ref 0.1–1.0)
Monocytes Relative: 6 % (ref 3.0–12.0)
Neutro Abs: 4.4 10*3/uL (ref 1.4–7.7)
Neutrophils Relative %: 57.1 % (ref 43.0–77.0)
Platelets: 256 10*3/uL (ref 150.0–400.0)
RBC: 4.96 Mil/uL (ref 3.87–5.11)
RDW: 13.8 % (ref 11.5–14.6)
WBC: 7.7 10*3/uL (ref 4.5–10.5)

## 2011-10-23 LAB — LIPID PANEL
Cholesterol: 172 mg/dL (ref 0–200)
Triglycerides: 82 mg/dL (ref 0.0–149.0)

## 2011-10-23 MED ORDER — BENAZEPRIL HCL 40 MG PO TABS: 40.0000 mg | ORAL_TABLET | Freq: Every day | ORAL | Status: DC

## 2011-10-23 NOTE — Progress Notes (Signed)
Nature conservation officer at [facility] [address] [place] [address] Phone: [phone] Fax: [phone]  Date:  10/23/2011   Name:  Carrie Ellis   DOB:  08-Nov-1965   MRN:  [id number] Gender: female Age: 46 y.o.  PCP:  Hannah Beat, MD    Chief Complaint: Annual Exam   History of Present Illness:  Carrie Ellis is a 46 y.o. pleasant patient who presents with the following:  GYN: 07/2011 - breast, pap, mammo - 01/2011 mammo  Wt Readings from Last 3 Encounters:  10/23/11 276 lb 12 oz (125.533 kg)  05/29/11 271 lb 12.8 oz (123.288 kg)  01/06/11 275 lb 6.4 oz (124.921 kg)   Has gained some weight. Not doing the   Mom had renal cancer, had it removed in [facility].  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 Has been eating a lot of frosty.  Health Maintenance Summary Reviewed and updated, unless pt declines services.  Tobacco History Reviewed. Non-smoker Alcohol: No concerns, no excessive use Exercise Habits: Some activity, rec at least 30 mins 5 times a week STD concerns: none Drug Use: None Birth control method: Menses regular: yes Lumps or breast concerns: no Breast Cancer Family History: no  Health Maintenance  Topic Date Due  . Influenza Vaccine  11/14/2011  . Tetanus/tdap  02/13/2014  . Pap Smear  07/23/2014    Labs reviewed with the patient.  Results for orders placed in visit on 10/23/11  HM MAMMOGRAPHY      Component Value Range   HM Mammogram normal     HM PAP SMEAR      Component Value Range   HM Pap smear normal     BASIC METABOLIC PANEL      Component Value Range   Sodium 139  135 - 145 mEq/L   Potassium 4.1  3.5 - 5.1 mEq/L   Chloride 106  96 - 112 mEq/L   CO2 24  19 - 32 mEq/L   Glucose, Bld 116 (*) 70 - 99 mg/dL   BUN 9  6 - 23 mg/dL   Creatinine, Ser 0.8  0.4 - 1.2 mg/dL   Calcium 8.7  8.4 - 86.5 mg/dL   GFR 784.69  >62.95 mL/min  CBC WITH DIFFERENTIAL      Component Value Range   WBC 7.7   4.5 - 10.5 K/uL   RBC 4.96  3.87 - 5.11 Mil/uL   Hemoglobin 13.1  12.0 - 15.0 g/dL   HCT 28.4  13.2 - 44.0 %   MCV 83.1  78.0 - 100.0 fl   MCHC 31.8  30.0 - 36.0 g/dL   RDW 10.2  72.5 - 36.6 %   Platelets 256.0  150.0 - 400.0 K/uL   Neutrophils Relative 57.1  43.0 - 77.0 %   Lymphocytes Relative 32.0  12.0 - 46.0 %   Monocytes Relative 6.0  3.0 - 12.0 %   Eosinophils Relative 4.4  0.0 - 5.0 %   Basophils Relative 0.5  0.0 - 3.0 %   Neutro Abs 4.4  1.4 - 7.7 K/uL   Lymphs Abs 2.4  0.7 - 4.0 K/uL   Monocytes Absolute 0.5  0.1 - 1.0 K/uL   Eosinophils Absolute 0.3  0.0 - 0.7 K/uL   Basophils Absolute 0.0  0.0 - 0.1 K/uL  HEPATIC FUNCTION PANEL      Component Value Range   Total Bilirubin 0.5  0.3 - 1.2 mg/dL   Bilirubin,  Direct 0.1  0.0 - 0.3 mg/dL   Alkaline Phosphatase 65  39 - 117 U/L   AST 14  0 - 37 U/L   ALT 17  0 - 35 U/L   Total Protein 7.5  6.0 - 8.3 g/dL   Albumin 3.7  3.5 - 5.2 g/dL  TSH      Component Value Range   TSH 0.38  0.35 - 5.50 uIU/mL  LIPID PANEL      Component Value Range   Cholesterol 172  0 - 200 mg/dL   Triglycerides 11.9  0.0 - 149.0 mg/dL   HDL 14.78  >29.56 mg/dL   VLDL 21.3  0.0 - 08.6 mg/dL   LDL Cholesterol 578 (*) 0 - 99 mg/dL   Total CHOL/HDL Ratio 4       Patient Active Problem List  Diagnosis  . HYPERTENSION  . ALLERGIC RHINITIS  . FROZEN SHOULDER  . FATIGUE    Past Medical History  Diagnosis Date  . Allergy   . Hypertension   . Hypothyroid   . Positive PPD, treated     9 months of treatment    Past Surgical History  Procedure Date  . Cholecystectomy 1999  . Tonsillectomy 1995    History  Substance Use Topics  . Smoking status: Never Smoker   . Smokeless tobacco: Not on file  . Alcohol Use: No    Family History  Problem Relation Age of Onset  . Alcohol abuse Neg Hx   . Cancer Neg Hx   . Heart disease Neg Hx   . Mental illness Neg Hx   . Stroke Other   . Diabetes Other     Allergies  Allergen Reactions    . Clarithromycin     REACTION: Extreme nausea  . Penicillins     REACTION: rash    Medication list has been reviewed and updated.  Current Outpatient Prescriptions on File Prior to Visit  Medication Sig Dispense Refill  . benazepril (LOTENSIN) 40 MG tablet Take 1 tablet (40 mg total) by mouth daily.  30 tablet  0  . loratadine (ALAVERT) 10 MG tablet Take 10 mg by mouth daily.      . Multiple Vitamin (MULTIVITAMIN) tablet Take 1 tablet by mouth daily.        . Norethin Ace-Eth Estrad-FE (MICROGESTIN FE 1/20 PO) Take by mouth daily.        Review of Systems:   General: Denies fever, chills, sweats. No significant weight loss. Eyes: Denies blurring,significant itching ENT: Denies earache, sore throat, and hoarseness.  Cardiovascular: Denies chest pains, palpitations, dyspnea on exertion,  Respiratory: Denies cough, dyspnea at rest,wheeezing Breast: no concerns about lumps GI: Denies nausea, vomiting, diarrhea, constipation, change in bowel habits, abdominal pain, melena, hematochezia GU: Denies dysuria, hematuria, urinary hesitancy, nocturia, denies STD risk, no concerns about discharge Musculoskeletal: Denies back pain, joint pain Derm: Denies rash, itching Neuro: Denies  paresthesias, frequent falls, frequent headaches Psych: sadness from Mother dx CA and care and stress. Stress eating. Endocrine: Denies cold intolerance, heat intolerance, polydipsia Heme: Denies enlarged lymph nodes Allergy: No hayfever   Physical Examination: Filed Vitals:   10/23/11 0850  BP: 130/80  Pulse: 102  Temp: 98.9 F (37.2 C)   Filed Vitals:   10/23/11 0850  Height: 6\' 1"  (1.854 m)  Weight: 276 lb 12 oz (125.533 kg)   Body mass index is 36.51 kg/(m^2). Ideal Body Weight: Weight in (lb) to have BMI = 25: 189.1  GEN: well developed, well nourished, no acute distress Eyes: conjunctiva and lids normal, PERRLA, EOMI ENT: TM clear, nares clear, oral exam WNL Neck: supple, no  lymphadenopathy, no thyromegaly, no JVD Pulm: clear to auscultation and percussion, respiratory effort normal CV: regular rate and rhythm, S1-S2, no murmur, rub or gallop, no bruits Chest: no scars, masses, no lumps BREAST: breast exam declined GI: soft, non-tender; no hepatosplenomegaly, masses; active bowel sounds all quadrants GU: GU exam declined Lymph: no cervical, axillary or inguinal adenopathy MSK: gait normal, muscle tone and strength WNL, no joint swelling, effusions, discoloration, crepitus  SKIN: clear, good turgor, color WNL, no rashes, lesions, or ulcerations Neuro: normal mental status, normal strength, sensation, and motion Psych: alert; oriented to person, place and time, normally interactive and not anxious or depressed in appearance.   Assessment and Plan:  1. Routine general medical examination at a health care facility    2. Screening for lipoid disorders  Lipid panel  3. Encounter for long-term (current) use of other medications  Basic metabolic panel, CBC with Differential, Hepatic function panel  4. Other malaise and fatigue  TSH    The patient's preventative maintenance and recommended screening tests for an annual wellness exam were reviewed in full today. Brought up to date unless services declined.  Counselled on the importance of diet, exercise, and its role in overall health and mortality. The patient's FH and SH was reviewed, including their home life, tobacco status, and drug and alcohol status.   Work on diet, exercise, weight loss  BS elevated, prediabetic -- letter sent to pt encouraging wt loss, exercise and reviewed in office   Orders Today:  Orders Placed This Encounter  Procedures  . HM MAMMOGRAPHY    This external order was created through the Results Console.  Marland Kitchen HM PAP SMEAR    This external order was created through the Results Console.  . Basic metabolic panel  . CBC with Differential  . Hepatic function panel  . TSH  . Lipid panel     Medications Today: (Includes new updates added during medication reconciliation) Meds ordered this encounter  Medications  . Norethin Ace-Eth Estrad-FE (MICROGESTIN FE 1/20 PO)    Sig: Take by mouth daily.  Marland Kitchen dextromethorphan (DELSYM) 30 MG/5ML liquid    Sig: Take 60 mg by mouth as needed.  . loratadine (ALAVERT) 10 MG tablet    Sig: Take 10 mg by mouth daily.  . benazepril (LOTENSIN) 40 MG tablet    Sig: Take 1 tablet (40 mg total) by mouth daily.    Dispense:  90 tablet    Refill:  3     Medications Discontinued: Medications Discontinued During This Encounter  Medication Reason  . benazepril (LOTENSIN) 40 MG tablet Reorder     Hannah Beat, MD,

## 2011-11-07 ENCOUNTER — Ambulatory Visit (INDEPENDENT_AMBULATORY_CARE_PROVIDER_SITE_OTHER): Payer: Federal, State, Local not specified - PPO

## 2011-11-07 DIAGNOSIS — Z23 Encounter for immunization: Secondary | ICD-10-CM

## 2011-12-13 ENCOUNTER — Encounter: Payer: Self-pay | Admitting: Family Medicine

## 2011-12-13 ENCOUNTER — Ambulatory Visit (INDEPENDENT_AMBULATORY_CARE_PROVIDER_SITE_OTHER): Payer: Federal, State, Local not specified - PPO | Admitting: Family Medicine

## 2011-12-13 VITALS — BP 126/80 | HR 80 | Temp 98.8°F | Wt 277.2 lb

## 2011-12-13 DIAGNOSIS — M25539 Pain in unspecified wrist: Secondary | ICD-10-CM

## 2011-12-13 MED ORDER — DICLOFENAC SODIUM 75 MG PO TBEC: 75.0000 mg | DELAYED_RELEASE_TABLET | Freq: Two times a day (BID) | ORAL | Status: DC

## 2011-12-13 NOTE — Progress Notes (Signed)
Nature conservation officer at [facility] [address] [place] [address] Phone: [phone] Fax: [phone]  Date:  12/13/2011   Name:  Carrie Ellis   DOB:  1965-12-13   MRN:  [id number] Gender: female Age: 46 y.o.  PCP:  Hannah Beat, MD  Evaluating MD: Hannah Beat, MD   Chief Complaint: Wrist Pain   History of Present Illness:  Carrie Ellis is a 46 y.o. pleasant patient who presents with the following:  Left wrist, pain. No fall or accident, and has a large purse on the left sack. Ulnar and dorsum of wrist.   Pain on dorsum of wrist for a few weeks, pain with lifting fingers, ulnar and radial deviation.  Patient Active Problem List  Diagnosis  . HYPERTENSION  . ALLERGIC RHINITIS  . FROZEN SHOULDER  . FATIGUE    Past Medical History  Diagnosis Date  . Allergy   . Hypertension   . Hypothyroid   . Positive PPD, treated     9 months of treatment    Past Surgical History  Procedure Date  . Cholecystectomy 1999  . Tonsillectomy 1995    History  Substance Use Topics  . Smoking status: Never Smoker   . Smokeless tobacco: Never Used  . Alcohol Use: No    Family History  Problem Relation Age of Onset  . Alcohol abuse Neg Hx   . Cancer Neg Hx   . Heart disease Neg Hx   . Mental illness Neg Hx   . Stroke Other   . Diabetes Other     Allergies  Allergen Reactions  . Clarithromycin     REACTION: Extreme nausea  . Penicillins     REACTION: rash    Medication list has been reviewed and updated.  Outpatient Prescriptions Prior to Visit  Medication Sig Dispense Refill  . benazepril (LOTENSIN) 40 MG tablet Take 1 tablet (40 mg total) by mouth daily.  90 tablet  3  . dextromethorphan (DELSYM) 30 MG/5ML liquid Take 60 mg by mouth as needed.      . loratadine (ALAVERT) 10 MG tablet Take 10 mg by mouth daily as needed.       . Multiple Vitamin (MULTIVITAMIN) tablet Take 1 tablet by mouth daily.        . Norethin Ace-Eth  Estrad-FE (MICROGESTIN FE 1/20 PO) Take by mouth daily.        Review of Systems:   GEN: No fevers, chills. Nontoxic. Primarily MSK c/o today. MSK: Detailed in the HPI GI: tolerating PO intake without difficulty Neuro: No numbness, parasthesias, or tingling associated. Otherwise the pertinent positives of the ROS are noted above.    Physical Examination: Filed Vitals:   12/13/11 0810  BP: 126/80  Pulse: 80  Temp: 98.8 F (37.1 C)  TempSrc: Oral  Weight: 277 lb 4 oz (125.76 kg)    There is no height on file to calculate BMI. Ideal Body Weight:     GEN: WDWN, NAD, Non-toxic, Alert & Oriented x 3 HEENT: Atraumatic, Normocephalic.  Ears and Nose: No external deformity. EXTR: No clubbing/cyanosis/edema NEURO: Normal gait.  PSYCH: Normally interactive. Conversant. Not depressed or anxious appearing.  Calm demeanor.   L hand Ecchymosis or edema: mild dorsal lower forearm swelling ROM wrist/hand/digits: full  Carpals, MCP's, digits: NT Distal Ulna and Radius: NT Ecchymosis or edema: neg No instability Cysts/nodules: neg Digit triggering: neg Finkelstein's test: mildly pos Snuffbox tenderness: neg Scaphoid tubercle: NT Resisted supination: NT Full composite fist,  no malrotation Grip, all digits: 5/5 str DIPJT: NT PIP JT: NT MCP JT: NT Axial load test: mildly pos Atrophy: neg  Hand sensation: intact   Assessment and Plan:  1. Wrist pain, acute    Probable tenosynovitis of multiple dorsal compartments. Thumb spica splint x 2-3 weeks, oral voltaren, ice after work.  Orders Today:  No orders of the defined types were placed in this encounter.    Updated Medication List: (Includes new medications, updates to list, dose adjustments) Meds ordered this encounter  Medications  . diclofenac (VOLTAREN) 75 MG EC tablet    Sig: Take 1 tablet (75 mg total) by mouth 2 (two) times daily.    Dispense:  60 tablet    Refill:  3    Medications Discontinued: There are  no discontinued medications.   Hannah Beat, MD

## 2012-02-27 ENCOUNTER — Other Ambulatory Visit: Payer: Self-pay | Admitting: Obstetrics & Gynecology

## 2012-02-27 DIAGNOSIS — Z1231 Encounter for screening mammogram for malignant neoplasm of breast: Secondary | ICD-10-CM

## 2012-03-25 ENCOUNTER — Ambulatory Visit
Admission: RE | Admit: 2012-03-25 | Discharge: 2012-03-25 | Disposition: A | Discharge status: Home / Self Care | Payer: Federal, State, Local not specified - PPO | Source: Ambulatory Visit | Attending: Obstetrics & Gynecology | Admitting: Obstetrics & Gynecology

## 2012-03-25 DIAGNOSIS — Z1231 Encounter for screening mammogram for malignant neoplasm of breast: Secondary | ICD-10-CM

## 2012-06-18 ENCOUNTER — Encounter: Payer: Self-pay | Admitting: Family Medicine

## 2012-06-18 ENCOUNTER — Ambulatory Visit (INDEPENDENT_AMBULATORY_CARE_PROVIDER_SITE_OTHER): Payer: Federal, State, Local not specified - PPO | Admitting: Family Medicine

## 2012-06-18 ENCOUNTER — Encounter: Payer: Self-pay | Admitting: *Deleted

## 2012-06-18 VITALS — BP 130/70 | HR 88 | Temp 97.9°F | Ht 73.0 in | Wt 266.8 lb

## 2012-06-18 DIAGNOSIS — R1013 Epigastric pain: Secondary | ICD-10-CM

## 2012-06-18 DIAGNOSIS — A088 Other specified intestinal infections: Secondary | ICD-10-CM

## 2012-06-18 DIAGNOSIS — A084 Viral intestinal infection, unspecified: Secondary | ICD-10-CM | POA: Insufficient documentation

## 2012-06-18 NOTE — Patient Instructions (Signed)
Hold diclofenac as this may irritate stomach. Slow sips of liquids, gradually titrate up amount of intake.  Can use prilosec for stomach irritation.  Call if abdominal pain not improving or severe and vomiting not resolving in 24-48 hours. Go to ER if you cannot keep down any liquids in next 12 hours for IV fluids.  May progress to diarrhea.

## 2012-06-18 NOTE — Assessment & Plan Note (Signed)
Rehydrate and maintain fluid status. Symptomatric care. Remain out of work until emesis resolved.

## 2012-06-18 NOTE — Assessment & Plan Note (Signed)
?   Gastritis componenet or secondary to viral infection. Can use prilosec if tolerating fluids.

## 2012-06-18 NOTE — Progress Notes (Signed)
  Subjective:    Patient ID: Carrie Ellis, female    DOB: Oct 11, 1965, 47 y.o.   MRN: [id number]  HPI 47 year old pt of Dr. Cyndie Chime presents with  24 hours of nausea and gas... Began throwing  Feels epigastric abdominal pain ... decscribes as cramping  Constantly. Relived some by emesis for a time. Frequent BMs, but not loose.  No blood in emesis or stool. No fever. Sick contacts at work.  No eating out or questionable intake.   Has kept down 4 oz liquid this AM so far. Doing sips.   Review of Systems  Constitutional: Negative for fever and fatigue.  HENT: Negative for ear pain.   Eyes: Negative for pain.  Respiratory: Negative for chest tightness and shortness of breath.   Cardiovascular: Negative for chest pain, palpitations and leg swelling.  Gastrointestinal: Negative for abdominal pain.  Genitourinary: Negative for dysuria.       Objective:   Physical Exam  Constitutional: Vital signs are normal. She appears well-developed and well-nourished. She is cooperative.  Non-toxic appearance. She does not appear ill. No distress.  HENT:  Head: Normocephalic.  Right Ear: Hearing, tympanic membrane, external ear and ear canal normal. Tympanic membrane is not erythematous, not retracted and not bulging.  Left Ear: Hearing, tympanic membrane, external ear and ear canal normal. Tympanic membrane is not erythematous, not retracted and not bulging.  Nose: No mucosal edema or rhinorrhea. Right sinus exhibits no maxillary sinus tenderness and no frontal sinus tenderness. Left sinus exhibits no maxillary sinus tenderness and no frontal sinus tenderness.  Mouth/Throat: Uvula is midline, oropharynx is clear and moist and mucous membranes are normal.  Eyes: Conjunctivae, EOM and lids are normal. Pupils are equal, round, and reactive to light. No foreign bodies found.  Neck: Trachea normal and normal range of motion. Neck supple. Carotid bruit is not present. No mass and no thyromegaly  present.  Cardiovascular: Normal rate, regular rhythm, S1 normal, S2 normal, normal heart sounds, intact distal pulses and normal pulses.  Exam reveals no gallop and no friction rub.   No murmur heard. Pulmonary/Chest: Effort normal and breath sounds normal. Not tachypneic. No respiratory distress. She has no decreased breath sounds. She has no wheezes. She has no rhonchi. She has no rales.  Abdominal: Soft. Normal appearance and bowel sounds are normal. There is tenderness in the epigastric area. There is no rebound and no guarding. No hernia.  Morbidly obese with central obesity.  Neurological: She is alert.  Skin: Skin is warm, dry and intact. No rash noted.  Psychiatric: Her speech is normal and behavior is normal. Judgment and thought content normal. Her mood appears not anxious. Cognition and memory are normal. She does not exhibit a depressed mood.          Assessment & Plan:

## 2012-11-01 ENCOUNTER — Ambulatory Visit: Payer: Federal, State, Local not specified - PPO

## 2012-11-06 ENCOUNTER — Encounter: Payer: Self-pay | Admitting: Obstetrics & Gynecology

## 2012-11-06 ENCOUNTER — Ambulatory Visit (INDEPENDENT_AMBULATORY_CARE_PROVIDER_SITE_OTHER): Payer: Federal, State, Local not specified - PPO | Admitting: Obstetrics & Gynecology

## 2012-11-06 VITALS — BP 137/82 | HR 88 | Temp 99.3°F | Ht 73.0 in | Wt 273.0 lb

## 2012-11-06 DIAGNOSIS — Z01419 Encounter for gynecological examination (general) (routine) without abnormal findings: Secondary | ICD-10-CM

## 2012-11-06 MED ORDER — NORETHIN ACE-ETH ESTRAD-FE 1-20 MG-MCG PO TABS: 1.0000 | ORAL_TABLET | Freq: Every day | ORAL | Status: DC

## 2012-11-06 NOTE — Progress Notes (Signed)
Subjective:     Carrie Ellis is a 47 y.o. female here for a routine exam.  Current complaints: Patient is in the office for annual exam.  Personal health questionnaire reviewed: no.   Gynecologic History Patient's last menstrual period was 10/17/2012. Contraception: OCP (estrogen/progesterone) Last Pap: 1 years. Results were: normal Last mammogram: 01/2012. Results were: normal  Obstetric History OB History  No data available     The following portions of the patient's history were reviewed and updated as appropriate: allergies, current medications, past family history, past medical history, past social history, past surgical history and problem list.  Review of Systems Pertinent items are noted in HPI.    Objective:    General appearance: alert Breasts: normal appearance, no masses or tenderness Abdomen: soft, non-tender; bowel sounds normal; no masses,  no organomegaly Pelvic: cervix normal in appearance, external genitalia normal, no adnexal masses or tenderness, uterus normal size, shape, and consistency and vagina normal without discharge    Assessment:    Healthy female exam.    Plan:    Resume COCP Return in 1 yr

## 2012-11-06 NOTE — Patient Instructions (Signed)
Exercise to Lose Weight Exercise and a healthy diet may help you lose weight. Your doctor may suggest specific exercises. EXERCISE IDEAS AND TIPS  Choose low-cost things you enjoy doing, such as walking, bicycling, or exercising to workout videos.  Take stairs instead of the elevator.  Walk during your lunch break.  Park your car further away from work or school.  Go to a gym or an exercise class.  Start with 5 to 10 minutes of exercise each day. Build up to 30 minutes of exercise 4 to 6 days a week.  Wear shoes with good support and comfortable clothes.  Stretch before and after working out.  Work out until you breathe harder and your heart beats faster.  Drink extra water when you exercise.  Do not do so much that you hurt yourself, feel dizzy, or get very short of breath. Exercises that burn about 150 calories:  Running 1  miles in 15 minutes.  Playing volleyball for 45 to 60 minutes.  Washing and waxing a car for 45 to 60 minutes.  Playing touch football for 45 minutes.  Walking 1  miles in 35 minutes.  Pushing a stroller 1  miles in 30 minutes.  Playing basketball for 30 minutes.  Raking leaves for 30 minutes.  Bicycling 5 miles in 30 minutes.  Walking 2 miles in 30 minutes.  Dancing for 30 minutes.  Shoveling snow for 15 minutes.  Swimming laps for 20 minutes.  Walking up stairs for 15 minutes.  Bicycling 4 miles in 15 minutes.  Gardening for 30 to 45 minutes.  Jumping rope for 15 minutes.  Washing windows or floors for 45 to 60 minutes. Document Released: 03/04/2010 Document Revised: 04/24/2011 Document Reviewed: 03/04/2010 ExitCare Patient Information 2014 [employer]. Calorie Counting Diet A calorie counting diet requires you to eat the number of calories that are right for you in a day. Calories are the measurement of how much energy you get from the food you eat. Eating the right amount of calories is important for staying at a  healthy weight. If you eat too many calories, your body will store them as fat and you may gain weight. If you eat too few calories, you may lose weight. Counting the number of calories you eat during a day will help you know if you are eating the right amount. A Registered Dietitian can determine how many calories you need in a day. The amount of calories needed varies from person to person. If your goal is to lose weight, you will need to eat fewer calories. Losing weight can benefit you if you are overweight or have health problems such as heart disease, high blood pressure, or diabetes. If your goal is to gain weight, you will need to eat more calories. Gaining weight may be necessary if you have a certain health problem that causes your body to need more energy. TIPS Whether you are increasing or decreasing the number of calories you eat during a day, it may be hard to get used to changes in what you eat and drink. The following are tips to help you keep track of the number of calories you eat.  Measure foods at home with measuring cups. This helps you know the amount of food and number of calories you are eating.  Restaurants often serve food in amounts that are larger than 1 serving. While eating out, estimate how many servings of a food you are given. For example, a serving of cooked rice   is  cup or about the size of half of a fist. Knowing serving sizes will help you be aware of how much food you are eating at restaurants.  Ask for smaller portion sizes or child-size portions at restaurants.  Plan to eat half of a meal at a restaurant. Take the rest home or share the other half with a friend.  Read the Nutrition Facts panel on food labels for calorie content and serving size. You can find out how many servings are in a package, the size of a serving, and the number of calories each serving has.  For example, a package might contain 3 cookies. The Nutrition Facts panel on that package says  that 1 serving is 1 cookie. Below that, it will say there are 3 servings in the container. The calories section of the Nutrition Facts label says there are 90 calories. This means there are 90 calories in 1 cookie (1 serving). If you eat 1 cookie you have eaten 90 calories. If you eat all 3 cookies, you have eaten 270 calories (3 servings x 90 calories = 270 calories). The list below tells you how big or small some common portion sizes are.  1 oz.........4 stacked dice.  3 oz.........Deck of cards.  1 tsp........Tip of little finger.  1 tbs........Thumb.  2 tbs........Golf ball.   cup.......Half of a fist.  1 cup........A fist. KEEP A FOOD LOG Write down every food item you eat, the amount you eat, and the number of calories in each food you eat during the day. At the end of the day, you can add up the total number of calories you have eaten. It may help to keep a list like the one below. Find out the calorie information by reading the Nutrition Facts panel on food labels. Breakfast  Bran cereal (1 cup, 110 calories).  Fat-free milk ( cup, 45 calories). Snack  Apple (1 medium, 80 calories). Lunch  Spinach (1 cup, 20 calories).  Tomato ( medium, 20 calories).  Chicken breast strips (3 oz, 165 calories).  Shredded cheddar cheese ( cup, 110 calories).  Light Italian dressing (2 tbs, 60 calories).  Whole-wheat bread (1 slice, 80 calories).  Tub margarine (1 tsp, 35 calories).  Vegetable soup (1 cup, 160 calories). Dinner  Pork chop (3 oz, 190 calories).  Brown rice (1 cup, 215 calories).  Steamed broccoli ( cup, 20 calories).  Strawberries (1  cup, 65 calories).  Whipped cream (1 tbs, 50 calories). Daily Calorie Total: 1425 Document Released: 01/30/2005 Document Revised: 04/24/2011 Document Reviewed: 07/27/2006 ExitCare Patient Information 2014 [employer].  

## 2012-11-13 ENCOUNTER — Ambulatory Visit (INDEPENDENT_AMBULATORY_CARE_PROVIDER_SITE_OTHER): Payer: Federal, State, Local not specified - PPO | Admitting: Family Medicine

## 2012-11-13 ENCOUNTER — Encounter: Payer: Self-pay | Admitting: Family Medicine

## 2012-11-13 VITALS — BP 140/92 | HR 104 | Temp 98.8°F | Ht 73.0 in | Wt 270.8 lb

## 2012-11-13 DIAGNOSIS — R21 Rash and other nonspecific skin eruption: Secondary | ICD-10-CM

## 2012-11-13 MED ORDER — BENAZEPRIL HCL 40 MG PO TABS: 40.0000 mg | ORAL_TABLET | Freq: Every day | ORAL | Status: DC

## 2012-11-13 NOTE — Progress Notes (Signed)
Nature conservation officer at [facility] [address] [place] [address] Phone: [phone] Fax: [phone]  Date:  11/13/2012   Name:  Carrie Ellis   DOB:  09/19/1965   MRN:  [id number] Gender: female Age: 47 y.o.  Primary Physician:  Hannah Beat, MD  Evaluating MD: Hannah Beat, MD   Chief Complaint: Rash   History of Present Illness:  Carrie Ellis is a 47 y.o. pleasant patient who presents with the following:  Had a really red rash on her neck. He was really itchy on her anterior upper chest. Also on her neck. It subsequently resolved without any treatment. She is still having some symptoms of itchiness, but no visible rash.    Patient Active Problem List   Diagnosis Date Noted  . Abdominal pain, epigastric 06/18/2012  . Viral gastroenteritis 06/18/2012  . FATIGUE 05/05/2009  . HYPERTENSION 12/31/2008  . ALLERGIC RHINITIS 12/31/2008  . 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 Allergy   . Hypertension   . Hypothyroid   . Positive PPD, treated     9 months of treatment    Past Surgical History  Procedure Laterality Date  . Cholecystectomy  1999  . Tonsillectomy  1995    History   Social History  . Marital Status: Married    Spouse Name: N/A    Number of Children: N/A  . Years of Education: N/A   Occupational History  . 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 Smoking status: Never Smoker   . Smokeless tobacco: Never Used  . Alcohol Use: No  . Drug Use: No  . Sexual Activity: Yes    Partners: Male    Birth Control/ Protection: OCP   Other Topics Concern  . Not on file   Social History Narrative  . No narrative on file    Family History  Problem Relation Age of Onset  . Alcohol abuse Neg Hx   . Heart disease Neg Hx   . Mental illness Neg Hx   . Stroke Other   . Diabetes Other   . Cancer Mother     Allergies   Allergen Reactions  . Clarithromycin     REACTION: Extreme nausea  . Penicillins     REACTION: rash    Medication list has been reviewed and updated.  Outpatient Prescriptions Prior to Visit  Medication Sig Dispense Refill  . benazepril (LOTENSIN) 40 MG tablet Take 1 tablet (40 mg total) by mouth daily.  90 tablet  3  . loratadine (ALAVERT) 10 MG tablet Take 10 mg by mouth daily as needed.       . loratadine-pseudoephedrine (CLARITIN-D 24-HOUR) 10-240 MG per 24 hr tablet Take 1 tablet by mouth daily.      . Multiple Vitamin (MULTIVITAMIN) tablet Take 1 tablet by mouth daily.        . norethindrone-ethinyl estradiol (MICROGESTIN FE 1/20) 1-20 MG-MCG tablet Take 1 tablet by mouth daily.  1 Package  11  . diclofenac (VOLTAREN) 75 MG EC tablet Take 1 tablet (75 mg total) by mouth 2 (two) times daily.  60 tablet  3   No facility-administered medications prior to visit.    Review of Systems:   GEN: No acute illnesses, no fevers, chills. GI: No n/v/d, eating normally Pulm: No SOB Interactive and getting along well at home.  Otherwise, ROS is as per  the HPI.   Physical Examination: BP 140/92  Pulse 104  Temp(Src) 98.8 F (37.1 C) (Oral)  Ht 6\' 1"  (1.854 m)  Wt 270 lb 12.8 oz (122.834 kg)  BMI 35.74 kg/m2  SpO2 97%  LMP 10/17/2012  Ideal Body Weight: Weight in (lb) to have BMI = 25: 189.1  130/76   GEN: WDWN, NAD, Non-toxic, Alert & Oriented x 3 HEENT: Atraumatic, Normocephalic.  Ears and Nose: No external deformity. EXTR: No clubbing/cyanosis/edema NEURO: Normal gait.  PSYCH: Normally interactive. Conversant. Not depressed or anxious appearing.  Calm demeanor.   SKIN: no rash  Assessment and Plan:  Rash and nonspecific skin eruption  Resolved, prn anti-h reasonable  Orders Today:  No orders of the defined types were placed in this encounter.    Updated Medication List: (Includes new medications, updates to list, dose adjustments) Meds ordered this encounter   Medications  . benazepril (LOTENSIN) 40 MG tablet    Sig: Take 1 tablet (40 mg total) by mouth daily.    Dispense:  90 tablet    Refill:  3    Medications Discontinued: Medications Discontinued During This Encounter  Medication Reason  . diclofenac (VOLTAREN) 75 MG EC tablet Patient Preference  . benazepril (LOTENSIN) 40 MG tablet Reorder      Signed, Taheem Fricke T. Wilkie Zenon, MD 11/13/2012 12:44 PM

## 2013-01-03 ENCOUNTER — Telehealth: Payer: Self-pay

## 2013-01-03 NOTE — Telephone Encounter (Signed)
Pt said 12/30/12 started with head congestion when blows nose has yellow phlegm,non productive hacky cough,no fever, wheezing or SOB. Pt taking mucinex but pt not sure what to take for cough due to hypertension. Pt cannot leave work and request cb. [facility] [address]

## 2013-01-03 NOTE — Telephone Encounter (Signed)
Patient notified as instructed by telephone. 

## 2013-01-03 NOTE — Telephone Encounter (Signed)
I usually suggest mucinex DM during the day, the DM part helps with cough.

## 2013-01-06 ENCOUNTER — Encounter: Payer: Self-pay | Admitting: Family Medicine

## 2013-01-06 ENCOUNTER — Ambulatory Visit (INDEPENDENT_AMBULATORY_CARE_PROVIDER_SITE_OTHER): Payer: Federal, State, Local not specified - PPO | Admitting: Family Medicine

## 2013-01-06 VITALS — BP 140/80 | HR 108 | Temp 99.1°F | Ht 73.0 in | Wt 273.5 lb

## 2013-01-06 DIAGNOSIS — J069 Acute upper respiratory infection, unspecified: Secondary | ICD-10-CM

## 2013-01-06 MED ORDER — HYDROCODONE-HOMATROPINE 5-1.5 MG/5ML PO SYRP: ORAL_SOLUTION | ORAL | Status: DC

## 2013-01-06 NOTE — Progress Notes (Signed)
Pre-visit discussion using our clinic review tool. No additional management support is needed unless otherwise documented below in the visit note.  

## 2013-01-06 NOTE — Patient Instructions (Signed)
Upper Respiratory Infection -Viral Infections  TREATMENT THAT HELPS WITH SYMPTOMS: 1. Drink plenty of fluids, but limit caffeine  2. Decongestant: for congested noses, sinuses, and ear tubes. Pressure release and help drainage: Sudafed (pseudephedrine or Phenylephrine) (NOT IF YOU HAVE HIGH BLOOD PRESSURE)  3. Nasal Sprays: Relieve pressure, promote drainage, open nasal and ear passages. Afrin can be used for only 3-4 days in row.  4. Cough Suppressants: Delsym is an example of a cough suppressant. It lasts for 12 hours  6. Expectorants: Liquify secretions and improve drainage  Take Guaifenesin (400mg), take 11/2 tabs by mouth AM and NOON. This is a higher dose than what the box says, but that is ok. It is safe and it liquifies the mucous better at this dose.  Get GUAIFENESIN by  going to [facility], [facility] or [facility] and getting MUCOUS RELIEF EXPECTORANT/CONGESTION. DO NOT GET MUCINEX (Timed Release Guaifenesin)   THESE WILL MAKE YOU FEEL BETTER FOR A WHILE, SO YOU CAN DEAL WITH YOUR SYMPTOMS. YOUR BODY HAS TO HEAL ITSELF.  ANTIBIOTICS DO NOT HELP IF YOU HAVE A VIRUS OR BAD COLD.  Antibiotics kill bacteria not viruses.  Bad viruses can make you feel just as bad or worse than bacterial infections (Like the flu - it is a virus)  

## 2013-01-06 NOTE — Progress Notes (Signed)
Patient Name: Carrie Ellis Date of Birth: 09-11-1965 Medical Record Number: [id number]  History of Present Illness:  Patent presents with runny nose, sneezing, cough, sore throat, malaise and minimal / low-grade fever .  Bad cold, has turned into a horrific cold. Started to get some coughing, and her daughter came home last weekend, and woke up last weekend with feeling bad, and day progressed. Was coughing and sneezing. Mucinex did help some and also had a cough. Next day, sneezing, coughing, and next day. Tried some Delsym over the weekend and having some coughing a lt over the weekend. Wakes up from sleep.   Head is really congested.   + recent exposure to others with similar symptoms.   The patent denies sore throat as the primary complaint. Denies sthortness of breath/wheezing, high fever, chest pain, rhinits for more than 14 days, significant myalgia, otalgia, facial pain, abdominal pain, changes in bowel or bladder.  PMH, PHS, Allergies, Problem List, Medications, Family History, and Social History have all been reviewed.  Patient Active Problem List   Diagnosis Date Noted  . Abdominal pain, epigastric 06/18/2012  . Viral gastroenteritis 06/18/2012  . FATIGUE 05/05/2009  . HYPERTENSION 12/31/2008  . ALLERGIC RHINITIS 12/31/2008  . 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 Allergy   . Hypertension   . Hypothyroid   . Positive PPD, treated     9 months of treatment    Past Surgical History  Procedure Laterality Date  . Cholecystectomy  1999  . Tonsillectomy  1995    History   Social History  . Marital Status: Married    Spouse Name: N/A    Number of Children: N/A  . Years of Education: N/A   Occupational History  . 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 Smoking status: Never Smoker   . Smokeless tobacco: Never Used  . Alcohol Use: No  . Drug Use:  No  . Sexual Activity: Yes    Partners: Male    Birth Control/ Protection: OCP   Other Topics Concern  . Not on file   Social History Narrative  . No narrative on file    Family History  Problem Relation Age of Onset  . Alcohol abuse Neg Hx   . Heart disease Neg Hx   . Mental illness Neg Hx   . Stroke Other   . Diabetes Other   . Cancer Mother     Allergies  Allergen Reactions  . Clarithromycin     REACTION: Extreme nausea  . Penicillins     REACTION: rash    Medication list reviewed and updated in full in [facility].  Review of Systems: as above, eating and drinking - tolerating PO. Urinating normally. No excessive vomitting or diarrhea. O/w as above.  Physical Exam:  Filed Vitals:   01/06/13 1050  BP: 140/80  Pulse: 108  Temp: 99.1 F (37.3 C)  TempSrc: Oral  Height: 6\' 1"  (1.854 m)  Weight: 273 lb 8 oz (124.059 kg)  SpO2: 97%    GEN: WDWN, Non-toxic, Atraumatic, normocephalic. A and O x 3. HEENT: Oropharynx clear without exudate, MMM, no significant LAD, mild rhinnorhea Ears: TM clear, COL visualized with good landmarks CV: RRR, no m/g/r. Pulm: CTA B, no wheezes, rhonchi, or crackles, normal respiratory effort. EXT: no c/c/e Psych: well oriented, neither depressed nor anxious in appearance  A/P: 1.  URI. Supportive care reviewed with patient. See patient instruction section.    URI (upper respiratory infection)  Patient Instructions  Upper Respiratory Infection -Viral Infections  TREATMENT THAT HELPS WITH SYMPTOMS: 1. Drink plenty of fluids, but limit caffeine  2. Decongestant: for congested noses, sinuses, and ear tubes. Pressure release and help drainage: Sudafed (pseudephedrine or Phenylephrine) (NOT IF YOU HAVE HIGH BLOOD PRESSURE)  3. Nasal Sprays: Relieve pressure, promote drainage, open nasal and ear passages. Afrin can be used for only 3-4 days in row.  4. Cough Suppressants: Delsym is an example of a cough suppressant. It lasts  for 12 hours  6. Expectorants: Liquify secretions and improve drainage  Take Guaifenesin (400mg ), take 11/2 tabs by mouth AM and NOON. This is a higher dose than what the box says, but that is ok. It is safe and it liquifies the mucous better at this dose.  Get GUAIFENESIN by  going [facility] or [facility] and getting MUCOUS RELIEF EXPECTORANT/CONGESTION. DO NOT GET MUCINEX (Timed Release Guaifenesin)   THESE WILL MAKE YOU FEEL BETTER FOR A WHILE, SO YOU CAN DEAL WITH YOUR SYMPTOMS. YOUR BODY HAS TO HEAL ITSELF.  ANTIBIOTICS DO NOT HELP IF YOU HAVE A VIRUS OR BAD COLD.  Antibiotics kill bacteria not viruses.  Bad viruses can make you feel just as bad or worse than bacterial infections (Like the flu - it is a virus)    Orders Today:  No orders of the defined types were placed in this encounter.    New medications, updates to list, dose adjustments: Meds ordered this encounter  Medications  . HYDROcodone-homatropine (HYCODAN) 5-1.5 MG/5ML syrup    Sig: 1 tsp po at night before bed prn cough    Dispense:  240 mL    Refill:  0    Signed,  Odalys Win T. Rutha Melgoza, MD, CAQ Sports Medicine  [facility] [address] [place] [address] Phone: [phone] Fax: [phone]  Updated Complete Medication List:   Medication List       This list is accurate as of: 01/06/13 11:20 AM.  Always use your most recent med list.               ALAVERT 10 MG tablet  Generic drug:  loratadine  Take 10 mg by mouth daily as needed.     benazepril 40 MG tablet  Commonly known as:  LOTENSIN  Take 1 tablet (40 mg total) by mouth daily.     HYDROcodone-homatropine 5-1.5 MG/5ML syrup  Commonly known as:  HYCODAN  1 tsp po at night before bed prn cough     loratadine-pseudoephedrine 10-240 MG per 24 hr tablet  Commonly known as:  CLARITIN-D 24-hour  Take 1 tablet by mouth daily.     multivitamin tablet  Take 1 tablet by mouth daily.      norethindrone-ethinyl estradiol 1-20 MG-MCG tablet  Commonly known as:  MICROGESTIN FE 1/20  Take 1 tablet by mouth daily.

## 2013-07-21 ENCOUNTER — Encounter: Payer: Self-pay | Admitting: Family Medicine

## 2013-07-21 ENCOUNTER — Ambulatory Visit (INDEPENDENT_AMBULATORY_CARE_PROVIDER_SITE_OTHER): Payer: Federal, State, Local not specified - PPO | Admitting: Family Medicine

## 2013-07-21 ENCOUNTER — Other Ambulatory Visit: Payer: Self-pay | Admitting: Family Medicine

## 2013-07-21 VITALS — BP 120/80 | HR 87 | Temp 98.3°F | Ht 73.0 in | Wt 273.5 lb

## 2013-07-21 DIAGNOSIS — M25539 Pain in unspecified wrist: Secondary | ICD-10-CM

## 2013-07-21 DIAGNOSIS — M25531 Pain in right wrist: Secondary | ICD-10-CM

## 2013-07-21 DIAGNOSIS — M65839 Other synovitis and tenosynovitis, unspecified forearm: Secondary | ICD-10-CM

## 2013-07-21 DIAGNOSIS — M65849 Other synovitis and tenosynovitis, unspecified hand: Secondary | ICD-10-CM

## 2013-07-21 DIAGNOSIS — M659 Synovitis and tenosynovitis, unspecified: Secondary | ICD-10-CM

## 2013-07-21 DIAGNOSIS — J302 Other seasonal allergic rhinitis: Secondary | ICD-10-CM

## 2013-07-21 DIAGNOSIS — J309 Allergic rhinitis, unspecified: Secondary | ICD-10-CM

## 2013-07-21 MED ORDER — PREDNISONE 20 MG PO TABS: ORAL_TABLET | ORAL | Status: DC

## 2013-07-21 MED ORDER — METHYLPREDNISOLONE ACETATE 80 MG/ML IJ SUSP
80.0000 mg | Freq: Once | INTRAMUSCULAR | Status: AC
  Administered 2013-07-21: 80 mg via INTRAMUSCULAR

## 2013-07-21 MED ORDER — FLUTICASONE PROPIONATE 50 MCG/ACT NA SUSP: 2.0000 | Freq: Every day | NASAL | Status: DC

## 2013-07-21 NOTE — Patient Instructions (Signed)
Allegra, one tablet a day.

## 2013-07-21 NOTE — Progress Notes (Signed)
[address] [place] [address] Phone: [phone] Fax: [phone]  Patient ID: Carrie NephewKimberly R Mccants MRN: [id number], DOB: 02/18/1965, 48 y.o. Date of Encounter: 07/21/2013  Primary Physician:  Hannah BeatSpencer Asaf Elmquist, MD   Chief Complaint: Wrist Pain and Sinusitis   Subjective:   History of Present Illness:  Carrie Ellis is a 48 y.o. very pleasant female patient who presents with the following:  Sinuses. Congested and allergies. Basically trying to deal with it. Zyrtec - a lot worse in the past 2 weeks. She is very significant sinus and allergy symptoms, and right now she is not taking any kind of allergy medication.  R wrist, dorsal synovitis. 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 Moving a lot. She is having a lot of repetitive motion activities, also using her right hand quite a bit. Now she has pain in the dorsum of the wrist as well as pain along the dorsum of the hand.  Past Medical History, Surgical History, Social History, Family History, Problem List, Medications, and Allergies have been reviewed and updated if relevant.  Review of Systems: ROS: GEN: Acute illness details above GI: Tolerating PO intake GU: maintaining adequate hydration and urination Pulm: No SOB Interactive and getting along well at home.  Otherwise, ROS is as per the HPI.   Objective:   Physical Examination: BP 120/80  Pulse 87  Temp(Src) 98.3 F (36.8 C) (Oral)  Ht 6\' 1"  (1.854 m)  Wt 273 lb 8 oz (124.059 kg)  BMI 36.09 kg/m2  LMP 07/21/2013   Gen: WDWN, NAD; A & O x3, cooperative. Pleasant.Globally Non-toxic HEENT: Normocephalic and atraumatic. Throat clear, w/o exudate, R TM clear, L TM - good landmarks, No fluid present. rhinnorhea.  MMM Frontal sinuses: NT Max sinuses: NT NECK: Anterior cervical  LAD is absent CV: RRR, No M/G/R, cap refill <2 sec PULM: Breathing comfortably in no respiratory distress. no wheezing, crackles, rhonchi EXT: No c/c/e PSYCH:  Friendly, good eye contact MSK: Nml gait  R hand Ecchymosis or edema: neg ROM wrist/hand/digits: full  There is tenderness to palpation in the dorsum of the true wrist joint. Carpals, MCP's, digits: NT Distal Ulna and Radius: NT Ecchymosis or edema: neg No instability Cysts/nodules: neg Digit triggering: neg Finkelstein's test: neg Snuffbox tenderness: neg Scaphoid tubercle: NT Resisted supination: NT Full composite fist, no malrotation Grip, all digits: 5/5 str DIPJT: NT PIP JT: NT MCP JT: NT No tenosynovitis Axial load test: neg Phalen's: neg Tinel's: neg Atrophy: neg  Hand sensation: intact    Laboratory and Imaging Data:  Assessment & Plan:   Synovitis of wrist - Plan: methylPREDNISolone acetate (DEPO-MEDROL) injection 80 mg  Seasonal allergies  Right wrist pain  Allergy flare.  Repetitive motion injury, right wrist cellulitis. The patient was placed in a cockup wrist forearm splint. Also gave her 80 mg of Depo-Medrol in the office, and gave her some prednisone orally.  New Prescriptions   FLUTICASONE (FLONASE) 50 MCG/ACT NASAL SPRAY    INSTILL 2 SPRAYS INTO EACH NOSTRIL ONCE DAILY   PREDNISONE (DELTASONE) 20 MG TABLET    2 tabs po for 4 days, then 1 tab po for 4 days   Modified Medications   No medications on file   No orders of the defined types were placed in this encounter.   Follow-up: No Follow-up on file. Unless noted above, the patient is to follow-up if symptoms worsen. Red flags were reviewed with the patient.  Signed,  Elpidio GaleaSpencer T.  Kristie Bracewell, MD, CAQ Sports Medicine   Discontinued Medications   LORATADINE (ALAVERT) 10 MG TABLET    Take 10 mg by mouth daily as needed.    LORATADINE-PSEUDOEPHEDRINE (CLARITIN-D 24-HOUR) 10-240 MG PER 24 HR TABLET    Take 1 tablet by mouth daily.   NORETHINDRONE-ETHINYL ESTRADIOL (MICROGESTIN FE 1/20) 1-20 MG-MCG TABLET    Take 1 tablet by mouth daily.   Current Medications at Discharge:   Medication List         This list is accurate as of: 07/21/13  1:26 PM.  Always use your most recent med list.               benazepril 40 MG tablet  Commonly known as:  LOTENSIN  Take 1 tablet (40 mg total) by mouth daily.     fluticasone 50 MCG/ACT nasal spray  Commonly known as:  FLONASE  INSTILL 2 SPRAYS INTO EACH NOSTRIL ONCE DAILY     HYDROcodone-homatropine 5-1.5 MG/5ML syrup  Commonly known as:  HYCODAN  1 tsp po at night before bed prn cough     multivitamin tablet  Take 1 tablet by mouth daily.     predniSONE 20 MG tablet  Commonly known as:  DELTASONE  2 tabs po for 4 days, then 1 tab po for 4 days

## 2013-07-21 NOTE — Progress Notes (Signed)
Pre visit review using our clinic review tool, if applicable. No additional management support is needed unless otherwise documented below in the visit note. 

## 2013-10-14 ENCOUNTER — Other Ambulatory Visit: Payer: Self-pay

## 2013-10-14 DIAGNOSIS — Z1231 Encounter for screening mammogram for malignant neoplasm of breast: Secondary | ICD-10-CM

## 2013-10-29 ENCOUNTER — Encounter (INDEPENDENT_AMBULATORY_CARE_PROVIDER_SITE_OTHER): Payer: Self-pay

## 2013-10-29 ENCOUNTER — Ambulatory Visit
Admission: RE | Admit: 2013-10-29 | Discharge: 2013-10-29 | Disposition: A | Discharge status: Home / Self Care | Payer: Federal, State, Local not specified - PPO | Source: Ambulatory Visit

## 2013-10-29 DIAGNOSIS — Z1231 Encounter for screening mammogram for malignant neoplasm of breast: Secondary | ICD-10-CM

## 2013-11-06 ENCOUNTER — Other Ambulatory Visit: Payer: Self-pay | Admitting: Family Medicine

## 2013-11-10 ENCOUNTER — Encounter: Payer: Self-pay | Admitting: Obstetrics & Gynecology

## 2013-11-10 ENCOUNTER — Ambulatory Visit (INDEPENDENT_AMBULATORY_CARE_PROVIDER_SITE_OTHER): Payer: Federal, State, Local not specified - PPO | Admitting: Obstetrics & Gynecology

## 2013-11-10 ENCOUNTER — Ambulatory Visit: Payer: Federal, State, Local not specified - PPO | Admitting: Obstetrics & Gynecology

## 2013-11-10 VITALS — BP 132/87 | HR 100 | Temp 98.8°F | Ht 73.0 in | Wt 275.0 lb

## 2013-11-10 DIAGNOSIS — Z01419 Encounter for gynecological examination (general) (routine) without abnormal findings: Secondary | ICD-10-CM

## 2013-11-10 DIAGNOSIS — Z3202 Encounter for pregnancy test, result negative: Secondary | ICD-10-CM

## 2013-11-10 DIAGNOSIS — Z23 Encounter for immunization: Secondary | ICD-10-CM

## 2013-11-10 NOTE — Addendum Note (Signed)
Addended by: Henriette Combs on: 11/10/2013 05:47 PM   Modules accepted: Orders, SmartSet

## 2013-11-10 NOTE — Progress Notes (Signed)
Subjective:     Carrie Ellis is a 48 y.o. female here for a routine exam.     Personal health questionnaire:  Is patient Ashkenazi Jewish, have a family history of breast and/or ovarian cancer: no Is there a family history of uterine cancer diagnosed at age < 32, gastrointestinal cancer, urinary tract cancer, family member who is a Personnel officer syndrome-associated carrier: yes Is the patient overweight and hypertensive, family history of diabetes, personal history of gestational diabetes or PCOS: yes Is patient over 72, have PCOS,  family history of premature CHD under age 80, diabetes, smoke, have hypertension or peripheral artery disease:  no At any time, has a partner hit, kicked or otherwise hurt or frightened you?: no Over the past 2 weeks, have you felt down, depressed or hopeless?: yes Over the past 2 weeks, have you felt little interest or pleasure in doing things?:no   Gynecologic History Patient's last menstrual period was 10/24/2013. Contraception: OCP (estrogen/progesterone) Last Pap: 2013. Results were: normal Last mammogram: 2015. Results were: normal  Obstetric History OB History  No data available    Past Medical History  Diagnosis Date  . Allergy   . Hypertension   . Hypothyroid   . Positive PPD, treated     9 months of treatment    Past Surgical History  Procedure Laterality Date  . Cholecystectomy  1999  . Tonsillectomy  1995    Current outpatient prescriptions:benazepril (LOTENSIN) 40 MG tablet, TAKE 1 TABLET BY MOUTH DAILY, Disp: 90 tablet, Rfl: 0;  MICROGESTIN FE 1/20 1-20 MG-MCG tablet, , Disp: , Rfl: ;  Multiple Vitamin (MULTIVITAMIN) tablet, Take 1 tablet by mouth daily.  , Disp: , Rfl: ;  fluticasone (FLONASE) 50 MCG/ACT nasal spray, INSTILL 2 SPRAYS INTO EACH NOSTRIL ONCE DAILY, Disp: 48 g, Rfl: 3 Allergies  Allergen Reactions  . Clarithromycin     REACTION: Extreme nausea  . Penicillins     REACTION: rash    History  Substance Use Topics   . Smoking status: Never Smoker   . Smokeless tobacco: Never Used  . Alcohol Use: No    Family History  Problem Relation Age of Onset  . Alcohol abuse Neg Hx   . Heart disease Neg Hx   . Mental illness Neg Hx   . Stroke Other   . Diabetes Other   . Cancer Mother       Review of Systems  Constitutional: negative for fatigue and weight loss Respiratory: negative for cough and wheezing Cardiovascular: negative for chest pain, fatigue and palpitations Gastrointestinal: negative for abdominal pain and change in bowel habits Musculoskeletal:negative for myalgias Neurological: negative for gait problems and tremors Behavioral/Psych: negative for abusive relationship, depression Endocrine: negative for temperature intolerance   Genitourinary:negative for abnormal menstrual periods, genital lesions, hot flashes, sexual problems and vaginal discharge Integument/breast: negative for breast lump, breast tenderness, nipple discharge and skin lesion(s)    Objective:       BP 132/87  Pulse 100  Temp(Src) 98.8 F (37.1 C)  Ht  (1.854 m)  Wt 124.739 kg (275 lb)  BMI 36.29 kg/m2  LMP 10/24/2013 General:  Alert,oriented x 4 with no acute distress observed.    Skin:   no rash or abnormalities  Lungs:   clear to auscultation bilaterally  Heart:   regular rate and rhythm, S1, S2 normal, no murmur, click, rub or gallop  Breasts:   normal without suspicious masses, skin or nipple changes or axillary nodes  Abdomen:  normal findings: no organomegaly, soft, non-tender and no hernia  Pelvis:  External genitalia: normal general appearance Urinary system: urethral meatus normal and bladder without fullness, nontender Vaginal: normal without tenderness, induration or masses Cervix: normal appearance Adnexa: normal bimanual exam Uterus: anteverted and non-tender, normal size   Lab Review Urine pregnancy test:Negative results on 11/10/2013  Labs reviewed no Radiologic studies reviewed  no    Assessment:    Healthy female exam.    Plan:  Encouraged questions, concerns, comments, or problems * Pt. Feels emotionally overwhelmed, but refuses any counseling. * Refused STD and HIV testing * Encouraged regarding eating less fatty foods and fast foods. * Encouraged exercising to decrease weight gain.  * Please follow up as necessary. * Follow up in 1 year for annual exam.   * Try meditating to decrease stress and do things you enjoy. * Flu vaccine today.      Meds ordered this encounter  Medications  . MICROGESTIN FE 1/20 1-20 MG-MCG tablet    Sig:    No orders of the defined types were placed in this encounter.

## 2013-11-10 NOTE — Patient Instructions (Signed)

## 2013-11-11 ENCOUNTER — Other Ambulatory Visit: Payer: Self-pay | Admitting: Obstetrics & Gynecology

## 2013-11-11 DIAGNOSIS — Z3041 Encounter for surveillance of contraceptive pills: Secondary | ICD-10-CM

## 2013-11-12 LAB — POCT URINE PREGNANCY: Preg Test, Ur: NEGATIVE

## 2013-11-12 NOTE — Addendum Note (Signed)
Addended by: Odessa FlemingBOHNE, Mohamed Portlock M on: 11/12/2013 10:11 AM   Modules accepted: Orders

## 2013-11-14 NOTE — Telephone Encounter (Signed)
Please advise on refill.

## 2013-11-16 NOTE — Telephone Encounter (Signed)
OK to refill

## 2013-11-24 NOTE — Telephone Encounter (Signed)
Refill sent.

## 2014-02-03 ENCOUNTER — Other Ambulatory Visit: Payer: Self-pay | Admitting: Family Medicine

## 2014-02-03 NOTE — Telephone Encounter (Signed)
Last office visit 07/21/2013.  Seen for lots of acute visit.  Last CPE 10/23/2011.  Refill?

## 2014-02-09 ENCOUNTER — Encounter: Payer: Self-pay | Admitting: *Deleted

## 2014-02-10 ENCOUNTER — Encounter: Payer: Self-pay | Admitting: Obstetrics & Gynecology

## 2014-03-18 ENCOUNTER — Ambulatory Visit (INDEPENDENT_AMBULATORY_CARE_PROVIDER_SITE_OTHER): Payer: Federal, State, Local not specified - PPO | Admitting: Family Medicine

## 2014-03-18 ENCOUNTER — Encounter: Payer: Self-pay | Admitting: Family Medicine

## 2014-03-18 VITALS — BP 136/95 | HR 85 | Temp 98.4°F | Ht 73.0 in | Wt 279.5 lb

## 2014-03-18 DIAGNOSIS — M654 Radial styloid tenosynovitis [de Quervain]: Secondary | ICD-10-CM

## 2014-03-18 DIAGNOSIS — M659 Synovitis and tenosynovitis, unspecified: Secondary | ICD-10-CM

## 2014-03-18 DIAGNOSIS — I10 Essential (primary) hypertension: Secondary | ICD-10-CM

## 2014-03-18 MED ORDER — LOSARTAN POTASSIUM 50 MG PO TABS: 50.0000 mg | ORAL_TABLET | Freq: Every day | ORAL | Status: DC

## 2014-03-18 MED ORDER — DICLOFENAC SODIUM 75 MG PO TBEC: 75.0000 mg | DELAYED_RELEASE_TABLET | Freq: Two times a day (BID) | ORAL | Status: DC

## 2014-03-18 NOTE — Progress Notes (Signed)
Dr. Karleen Hampshire T. Branton Einstein, MD, CAQ Sports Medicine Primary Care and Sports Medicine [address] [place], [address] Phone: [phone] Fax: [phone]  03/18/2014  Patient: Carrie Ellis, MRN: [id number], DOB: 01-24-1966, 49 y.o.  Primary Physician:  Hannah Beat, MD  Chief Complaint: Wrist Pain and Follow-up  Subjective:   Carrie Ellis is a 49 y.o. very pleasant female patient who presents with the following:  Left wrist. Works on a computer all day. Nothing that she knows of. When puts weight on it, it will start trembling. ? Smaller purse. Did not help. This is been hurting for least a month or 2, and it is bothering her more and more over time. She does have some swelling in the dorsal aspect of her wrist is somewhat dorsal aspect of her hand. She denies any specific trauma or injury. He does not have any sort of known systemic rheumatological condition.  MCP, Deq  HTN: ACE cough.  She has been getting a cough dry and nonproductive for many months. She wonders if this is from her ACE inhibitor.   Past Medical History, Surgical History, Social History, Family History, Problem List, Medications, and Allergies have been reviewed and updated if relevant.  GEN: No fevers, chills. Nontoxic. Primarily MSK c/o today. MSK: Detailed in the HPI GI: tolerating PO intake without difficulty Neuro: No numbness, parasthesias, or tingling associated. Otherwise the pertinent positives of the ROS are noted above.   Objective:   BP 136/95 mmHg  Pulse 85  Temp(Src) 98.4 F (36.9 C) (Oral)  Ht  (1.854 m)  Wt 279 lb 8 oz (126.78 kg)  BMI 36.88 kg/m2  LMP 03/13/2014   GEN: WDWN, NAD, Non-toxic, Alert & Oriented x 3 HEENT: Atraumatic, Normocephalic.  Ears and Nose: No external deformity. EXTR: No clubbing/cyanosis/edema NEURO: Normal gait.  PSYCH: Normally interactive. Conversant. Not depressed or anxious appearing.  Calm demeanor.    On the left-hand  the patient has some swelling is evident in the dorsum of her true wrist, she also has some bogginess on the MCP joints on the left. She also has a positive Finkelstein's test. The remainder of the bony anatomy is nontender throughout her MCPs and fingers. Her ulna and radius are nontender throughout.  Radiology: No results found.  Assessment and Plan:   Synovitis of wrist  De Quervain's tenosynovitis  Essential hypertension  Place the patient in a thumb spica splint for the next 3 weeks then placed on high-dose NSAIDs.  If not improved in 3-4 weeks, consider intra-articular wrist injection.  Discontinue ACE inhibitor and convert to losartan.  Follow-up: No Follow-up on file.  New Prescriptions   DICLOFENAC (VOLTAREN) 75 MG EC TABLET    Take 1 tablet (75 mg total) by mouth 2 (two) times daily.   LOSARTAN (COZAAR) 50 MG TABLET    Take 1 tablet (50 mg total) by mouth daily.   No orders of the defined types were placed in this encounter.    Signed,  Elpidio Galea. Ryan Ogborn, MD   Patient's Medications  New Prescriptions   DICLOFENAC (VOLTAREN) 75 MG EC TABLET    Take 1 tablet (75 mg total) by mouth 2 (two) times daily.   LOSARTAN (COZAAR) 50 MG TABLET    Take 1 tablet (50 mg total) by mouth daily.  Previous Medications   FLUTICASONE (FLONASE) 50 MCG/ACT NASAL SPRAY    INSTILL 2 SPRAYS INTO EACH NOSTRIL ONCE DAILY   MULTIPLE VITAMIN (MULTIVITAMIN) TABLET  Take 1 tablet by mouth daily.    Modified Medications   No medications on file  Discontinued Medications   BENAZEPRIL (LOTENSIN) 40 MG TABLET    TAKE 1 TABLET BY MOUTH EVERY DAY   MICROGESTIN FE 1/20 1-20 MG-MCG TABLET       MICROGESTIN FE 1/20 1-20 MG-MCG TABLET    TAKE 1 TABLET BY MOUTH EVERY DAY

## 2014-03-18 NOTE — Progress Notes (Signed)
Pre visit review using our clinic review tool, if applicable. No additional management support is needed unless otherwise documented below in the visit note. 

## 2014-03-19 ENCOUNTER — Telehealth: Payer: Self-pay | Admitting: Family Medicine

## 2014-03-19 NOTE — Telephone Encounter (Signed)
emmi mailed  °

## 2014-04-18 ENCOUNTER — Emergency Department: Payer: Self-pay | Admitting: Emergency Medicine

## 2014-08-19 ENCOUNTER — Ambulatory Visit (INDEPENDENT_AMBULATORY_CARE_PROVIDER_SITE_OTHER): Payer: Federal, State, Local not specified - PPO | Admitting: Internal Medicine

## 2014-08-19 ENCOUNTER — Encounter: Payer: Self-pay | Admitting: Internal Medicine

## 2014-08-19 ENCOUNTER — Encounter: Payer: Self-pay | Admitting: *Deleted

## 2014-08-19 VITALS — BP 140/80 | HR 81 | Temp 98.3°F | Wt 280.0 lb

## 2014-08-19 DIAGNOSIS — J069 Acute upper respiratory infection, unspecified: Secondary | ICD-10-CM | POA: Diagnosis not present

## 2014-08-19 MED ORDER — HYDROCODONE-HOMATROPINE 5-1.5 MG/5ML PO SYRP: ORAL_SOLUTION | ORAL | Status: DC

## 2014-08-19 NOTE — Progress Notes (Signed)
Pre visit review using our clinic review tool, if applicable. No additional management support is needed unless otherwise documented below in the visit note. 

## 2014-08-19 NOTE — Assessment & Plan Note (Signed)
No evidence of bacterial infection Discussed symptom relief Cough syrup for bedtime

## 2014-08-19 NOTE — Progress Notes (Signed)
   Subjective:    Patient ID: Carrie Ellis, female    DOB: 1965/06/30, 49 y.o.   MRN: [id number]  HPI Here due to respiratory illness  Feels very congested in head Right ear is throbbing Persistent cough and PND at night Started 2 days ago  No fever No SOB Cough mostly dry No sore throat  Only taking some afrin for nose and ibuprofen for headache--both helped some  Current Outpatient Prescriptions on File Prior to Visit  Medication Sig Dispense Refill  . fluticasone (FLONASE) 50 MCG/ACT nasal spray INSTILL 2 SPRAYS INTO EACH NOSTRIL ONCE DAILY 48 g 3  . losartan (COZAAR) 50 MG tablet Take 1 tablet (50 mg total) by mouth daily. 90 tablet 3  . Multiple Vitamin (MULTIVITAMIN) tablet Take 1 tablet by mouth daily.       No current facility-administered medications on file prior to visit.    Allergies  Allergen Reactions  . Clarithromycin     REACTION: Extreme nausea  . Penicillins     REACTION: rash  . Ace Inhibitors Cough    Past Medical History  Diagnosis Date  . Allergy   . Hypertension   . Hypothyroid   . Positive PPD, treated     9 months of treatment    Past Surgical History  Procedure Laterality Date  . Cholecystectomy  1999  . Tonsillectomy  1995    Family History  Problem Relation Age of Onset  . Alcohol abuse Neg Hx   . Heart disease Neg Hx   . Mental illness Neg Hx   . Stroke Other   . Diabetes Other   . Cancer Mother     History   Social History  . Marital Status: Married    Spouse Name: N/A  . Number of Children: N/A  . Years of Education: N/A   Occupational History  . 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 Smoking status: Never Smoker   . Smokeless tobacco: Never Used  . Alcohol Use: No  . Drug Use: No  . Sexual Activity:    Partners: Male    Birth Control/ Protection: OCP   Other Topics Concern  . Not on file   Social History Narrative  'Review  of Systems No rash No vomiting or diarrhea Appetite is off    Objective:   Physical Exam  Constitutional: She appears well-developed. No distress.  Appears mildly uncomfortable Slightly hoarse  HENT:  Mouth/Throat: Oropharynx is clear and moist. No oropharyngeal exudate.  No sinus tenderness Moderate nasal inflammation TMs normal  Neck: Normal range of motion. Neck supple. No thyromegaly present.  Pulmonary/Chest: Effort normal and breath sounds normal. No respiratory distress. She has no wheezes. She has no rales.  Lymphadenopathy:    She has no cervical adenopathy.          Assessment & Plan:

## 2014-09-11 ENCOUNTER — Emergency Department (HOSPITAL_COMMUNITY): Admission: EM | Admit: 2014-09-11 | Discharge: 2014-09-11 | Discharge status: Absent without leave | Payer: Self-pay

## 2014-09-11 ENCOUNTER — Encounter (HOSPITAL_COMMUNITY): Payer: Self-pay

## 2014-09-11 ENCOUNTER — Emergency Department (HOSPITAL_COMMUNITY)
Admission: EM | Admit: 2014-09-11 | Discharge: 2014-09-11 | Disposition: A | Discharge status: Home / Self Care | Payer: Federal, State, Local not specified - PPO | Source: Home / Self Care

## 2014-09-11 DIAGNOSIS — M62830 Muscle spasm of back: Secondary | ICD-10-CM

## 2014-09-11 MED ORDER — TRAMADOL HCL 50 MG PO TABS
50.0000 mg | ORAL_TABLET | Freq: Four times a day (QID) | ORAL | Status: DC | PRN
Start: 2014-09-11 — End: 2015-02-10

## 2014-09-11 MED ORDER — CYCLOBENZAPRINE HCL 10 MG PO TABS: 10.0000 mg | ORAL_TABLET | Freq: Two times a day (BID) | ORAL | Status: DC | PRN

## 2014-09-11 NOTE — ED Provider Notes (Signed)
CSN: [id number]     Arrival date & time 09/11/14  1734 History   None    Chief Complaint  Patient presents with  . Shoulder Pain   (Consider location/radiation/quality/duration/timing/severity/associated sxs/prior Treatment)  HPI   Patient is a 49 year old female presenting today with complaints of right shoulder pain that started Thursday morning when she woke from sleep. Patient states that she has been able to use ibuprofen on and off with some relief intermittently. Patient denies any injury to area. Significant medical history includes gallbladder removal in 1999 and hypertension for which she is taking her medication as directed.  Past Medical History  Diagnosis Date  . Allergy   . Hypertension   . Hypothyroid   . Positive PPD, treated     9 months of treatment   Past Surgical History  Procedure Laterality Date  . Cholecystectomy  1999  . Tonsillectomy  1995   Family History  Problem Relation Age of Onset  . Alcohol abuse Neg Hx   . Heart disease Neg Hx   . Mental illness Neg Hx   . Stroke Other   . Diabetes Other   . Cancer Mother    History  Substance Use Topics  . Smoking status: Never Smoker   . Smokeless tobacco: Never Used  . Alcohol Use: No   OB History    No data available     Review of Systems  Constitutional: Negative.  Negative for fever and chills.  HENT: Negative.  Negative for sinus pressure, sneezing and sore throat.   Eyes: Negative.   Respiratory: Negative.  Negative for cough, chest tightness, shortness of breath and wheezing.   Cardiovascular: Negative.  Negative for chest pain, palpitations and leg swelling.  Gastrointestinal: Negative.  Negative for nausea, vomiting, abdominal pain and diarrhea.  Endocrine: Negative.   Genitourinary: Negative.   Musculoskeletal: Positive for back pain. Negative for neck pain and neck stiffness.  Skin: Negative.  Negative for pallor and rash.  Allergic/Immunologic: Negative.  Negative for environmental  allergies, food allergies and immunocompromised state.  Neurological: Negative.   Hematological: Negative.   Psychiatric/Behavioral: Negative.     Allergies  Clarithromycin; Penicillins; and Ace inhibitors  Home Medications   Prior to Admission medications   Medication Sig Start Date End Date Taking? Authorizing Provider  cyclobenzaprine (FLEXERIL) 10 MG tablet Take 1 tablet (10 mg total) by mouth 2 (two) times daily as needed for muscle spasms. 09/11/14   Servando Salina, NP  fluticasone [facility]) 50 MCG/ACT nasal spray INSTILL 2 SPRAYS INTO EACH NOSTRIL ONCE DAILY 07/21/13   Hannah Beat, MD  HYDROcodone-homatropine [facility]) 5-1.5 MG/5ML syrup 1 tsp po at night before bed prn cough 08/19/14   Karie Schwalbe, MD  losartan (COZAAR) 50 MG tablet Take 1 tablet (50 mg total) by mouth daily. 03/18/14   Hannah Beat, MD  Multiple Vitamin (MULTIVITAMIN) tablet Take 1 tablet by mouth daily.      Historical Provider, MD  traMADol (ULTRAM) 50 MG tablet Take 1 tablet (50 mg total) by mouth every 6 (six) hours as needed. 09/11/14   Servando Salina, NP   There were no vitals taken for this visit.   Physical Exam  Constitutional: She is oriented to person, place, and time. She appears well-developed and well-nourished. No distress.  HENT:  Head: Normocephalic and atraumatic.  Eyes: Conjunctivae are normal. Pupils are equal, round, and reactive to light. Right eye exhibits no discharge. Left eye exhibits no discharge. No scleral icterus.  Neck:  Normal range of motion. Neck supple.  Negative for nuchal rigidity. Negative Spurling's test.  Cardiovascular: Normal rate, regular rhythm, normal heart sounds and intact distal pulses.  Exam reveals no gallop and no friction rub.   No murmur heard. Pulmonary/Chest: Effort normal and breath sounds normal. No respiratory distress. She has no wheezes. She has no rales. She exhibits no tenderness.  Musculoskeletal: Normal range of motion. She exhibits  tenderness. She exhibits no edema.       Right shoulder: Normal. She exhibits normal range of motion, no tenderness, no bony tenderness, no swelling, no effusion, no crepitus, no deformity, no laceration, no pain, no spasm, normal pulse and normal strength.       Arms: CMS intact and cap refill <3 seconds. 2+ radial pulse present.    Lymphadenopathy:    She has no cervical adenopathy.  Neurological: She is alert and oriented to person, place, and time. She displays normal reflexes. No cranial nerve deficit. She exhibits normal muscle tone. Coordination normal.  Skin: Skin is warm and dry. She is not diaphoretic.  Nursing note and vitals reviewed.   ED Course  Procedures (including critical care time) Labs Review Labs Reviewed - No data to display  Imaging Review No results found.   MDM   1. Muscle spasm of back    Meds ordered this encounter  Medications  . traMADol (ULTRAM) 50 MG tablet    Sig: Take 1 tablet (50 mg total) by mouth every 6 (six) hours as needed.    Dispense:  15 tablet    Refill:  0  . cyclobenzaprine (FLEXERIL) 10 MG tablet    Sig: Take 1 tablet (10 mg total) by mouth 2 (two) times daily as needed for muscle spasms.    Dispense:  20 tablet    Refill:  0   Patient to follow up with PCP if symptoms worsen or fail to improve.  The patient verbalizes understanding and agrees to plan of care.       Servando Salina, NP 09/11/14 1916

## 2014-09-11 NOTE — ED Notes (Signed)
C/o pain right shoulder, scapular area x past 2-3 days. No known injury . No relief w 800 mg motrin. Pain restricts use of dominant arm. Denies numbness, but c.o occasional pain shooting into hand and wrist. Denies neck pain

## 2014-09-11 NOTE — Discharge Instructions (Signed)

## 2014-09-21 ENCOUNTER — Encounter: Payer: Self-pay | Admitting: Family Medicine

## 2014-09-21 ENCOUNTER — Ambulatory Visit (INDEPENDENT_AMBULATORY_CARE_PROVIDER_SITE_OTHER): Payer: Federal, State, Local not specified - PPO | Admitting: Family Medicine

## 2014-09-21 VITALS — BP 150/98 | HR 120 | Temp 98.8°F | Ht 73.0 in | Wt 281.5 lb

## 2014-09-21 DIAGNOSIS — M542 Cervicalgia: Secondary | ICD-10-CM | POA: Diagnosis not present

## 2014-09-21 NOTE — Progress Notes (Signed)
Dr. Karleen Hampshire T. Lofton Leon, MD, CAQ Sports Medicine Primary Care and Sports Medicine [address] [place], [address] Phone: [phone] Fax: [phone]  09/21/2014  Patient: Carrie Ellis, MRN: [id number], DOB: 1965/12/13, 49 y.o.  Primary Physician:  Hannah Beat, MD  Chief Complaint: Neck Pain and Shoulder Pain  Subjective:   Carrie Ellis is a 49 y.o. very pleasant female patient who presents with the following:  Woke up and was having some pain in her R shoulder. By Friday went to the UC, thought pulled a muscle in her shoulder blade. 2 fridays ago. Pain and muscle spasm and ongoing.   Basically, the patient has been having posterior neck pain, shoulder blade pain on the R, and oc radiculopathy on the R for 2 weeks. No rehab. She has been taking tramadol and Flexeril.  Past Medical History, Surgical History, Social History, Family History, Problem List, Medications, and Allergies have been reviewed and updated if relevant.  Patient Active Problem List   Diagnosis Date Noted  . Viral upper respiratory tract infection 08/19/2014  . FATIGUE 05/05/2009  . Essential hypertension 12/31/2008  . ALLERGIC RHINITIS 12/31/2008  . 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 Allergy   . Hypertension   . Hypothyroid   . Positive PPD, treated     9 months of treatment    Past Surgical History  Procedure Laterality Date  . Cholecystectomy  1999  . Tonsillectomy  1995    History   Social History  . Marital Status: Married    Spouse Name: N/A  . Number of Children: N/A  . Years of Education: N/A   Occupational History  . Clerical Accounting Tech [employer]   Social History Main Topics  . Smoking status: Never Smoker   . Smokeless tobacco: Never Used  . Alcohol Use: No  . Drug Use: No  . Sexual Activity:    Partners: Male    Birth Control/ Protection: OCP   Other Topics  Concern  . Not on file   Social History Narrative    Family History  Problem Relation Age of Onset  . Alcohol abuse Neg Hx   . Heart disease Neg Hx   . Mental illness Neg Hx   . Stroke Other   . Diabetes Other   . Cancer Mother     Allergies  Allergen Reactions  . Clarithromycin     REACTION: Extreme nausea  . Diclofenac Nausea Only  . Penicillins     REACTION: rash  . Ace Inhibitors Cough    Medication list reviewed and updated in full in [facility].  ROS: GEN: Acute illness details above GI: Tolerating PO intake GU: maintaining adequate hydration and urination Pulm: No SOB Interactive and getting along well at home.  Otherwise, ROS is as per the HPI.   Objective:   BP 150/98 mmHg  Pulse 120  Temp(Src) 98.8 F (37.1 C) (Oral)  Ht 6\' 1"  (1.854 m)  Wt 281 lb 8 oz (127.688 kg)  BMI 37.15 kg/m2  LMP 09/11/2014   GEN: Well-developed,well-nourished,in no acute distress; alert,appropriate and cooperative throughout examination HEENT: Normocephalic and atraumatic without obvious abnormalities. Ears, externally no deformities PULM: Breathing comfortably in no respiratory distress EXT: No clubbing, cyanosis, or edema PSYCH: Normally interactive. Cooperative during the interview. Pleasant. Friendly and conversant. Not anxious or depressed appearing. Normal, full affect.  CERVICAL SPINE EXAM Range of motion:  Flexion, extension, lateral bending, and rotation: 30% loss of motion with forward flexion. Lateral bending and rotation also have about 20-25% loss of motion. Extension is normal. Pain with terminal motion: Yes Spinous Processes: NT SCM: NT Upper paracervical muscles: Moderately tender to palpation Upper traps: R upper trap TTP C5-T1 intact, sensation and motor    Laboratory and Imaging Data:  Assessment and Plan:   Cervicalgia  Neck pain with right-sided referred shoulder blade and radiculopathy occasionally. Initiate McKenzie protocol  rehabilitation, Aleve by mouth twice a day.  Patient Instructions  Alleve 2 tabs by mouth two times a day over the counter: Take at least for 2 - 3 weeks. This is equal to a prescripton strength dose (GENERIC CHEAPER EQUIVALENT IS NAPROXEN SODIUM)   Neck rehab every day.  Call me in 10 days if not making progress     Signed,  Marishka Rentfrow T. Archita Lomeli, MD   Patient's Medications  New Prescriptions   No medications on file  Previous Medications   CYCLOBENZAPRINE (FLEXERIL) 10 MG TABLET    Take 1 tablet (10 mg total) by mouth 2 (two) times daily as needed for muscle spasms.   FLUTICASONE (FLONASE) 50 MCG/ACT NASAL SPRAY    INSTILL 2 SPRAYS INTO EACH NOSTRIL ONCE DAILY   LOSARTAN (COZAAR) 50 MG TABLET    Take 1 tablet (50 mg total) by mouth daily.   MULTIPLE VITAMIN (MULTIVITAMIN) TABLET    Take 1 tablet by mouth daily.     TRAMADOL (ULTRAM) 50 MG TABLET    Take 1 tablet (50 mg total) by mouth every 6 (six) hours as needed.  Modified Medications   No medications on file  Discontinued Medications   HYDROCODONE-HOMATROPINE (HYCODAN) 5-1.5 MG/5ML SYRUP    1 tsp po at night before bed prn cough

## 2014-09-21 NOTE — Progress Notes (Signed)
Pre visit review using our clinic review tool, if applicable. No additional management support is needed unless otherwise documented below in the visit note. 

## 2014-09-21 NOTE — Patient Instructions (Signed)
Alleve 2 tabs by mouth two times a day over the counter: Take at least for 2 - 3 weeks. This is equal to a prescripton strength dose (GENERIC CHEAPER EQUIVALENT IS NAPROXEN SODIUM)   Neck rehab every day.  Call me in 10 days if not making progress

## 2014-11-16 ENCOUNTER — Ambulatory Visit: Payer: Federal, State, Local not specified - PPO | Admitting: Obstetrics & Gynecology

## 2014-11-20 ENCOUNTER — Ambulatory Visit (INDEPENDENT_AMBULATORY_CARE_PROVIDER_SITE_OTHER): Payer: Federal, State, Local not specified - PPO

## 2014-11-20 DIAGNOSIS — Z23 Encounter for immunization: Secondary | ICD-10-CM

## 2015-02-05 ENCOUNTER — Encounter: Payer: Federal, State, Local not specified - PPO | Admitting: Obstetrics & Gynecology

## 2015-02-05 ENCOUNTER — Encounter: Payer: Self-pay | Admitting: Obstetrics & Gynecology

## 2015-02-05 NOTE — Patient Instructions (Signed)
Thank you for enrolling in MyChart. Please follow the instructions below to securely access your online medical record. MyChart allows you to send messages to your doctor, view your test results, manage appointments, and more.   How Do I Sign Up? 1. In your Internet browser, go to [employer] and enter [email or web address]. 2. Click on the Sign Up Now link in the Sign In box. You will see the New Member Sign Up page. 3. Enter your MyChart Access Code exactly as it appears below. You will not need to use this code after you've completed the sign-up process. If you do not sign up before the expiration date, you must request a new code.  MyChart Access Code: [email or web address] Expires: 03/26/2015  1:55 PM  4. Enter your Social Security Number ([id number]) and Date of Birth (mm/dd/yyyy) as indicated and click Submit. You will be taken to the next sign-up page. 5. Create a MyChart ID. This will be your MyChart login ID and cannot be changed, so think of one that is secure and easy to remember. 6. Create a MyChart password. You can change your password at any time. 7. Enter your Password Reset Question and Answer. This can be used at a later time if you forget your password.  8. Enter your e-mail address. You will receive e-mail notification when new information is available in MyChart. 9. Click Sign Up. You can now view your medical record.   Additional Information Remember, MyChart is NOT to be used for urgent needs. For medical emergencies, dial 911.

## 2015-02-05 NOTE — Progress Notes (Addendum)
  This encounter was created in error - please disregard.  Patient had menstrual period, heavy flow.  Annual exam and pap rescheduled for 02/10/15 at 2:45 pm.

## 2015-02-10 ENCOUNTER — Ambulatory Visit (INDEPENDENT_AMBULATORY_CARE_PROVIDER_SITE_OTHER): Payer: Federal, State, Local not specified - PPO | Admitting: Certified Nurse Midwife

## 2015-02-10 ENCOUNTER — Encounter: Payer: Self-pay | Admitting: Certified Nurse Midwife

## 2015-02-10 VITALS — BP 153/96 | HR 96 | Resp 20 | Ht 73.0 in | Wt 276.0 lb

## 2015-02-10 DIAGNOSIS — Z1239 Encounter for other screening for malignant neoplasm of breast: Secondary | ICD-10-CM

## 2015-02-10 DIAGNOSIS — R110 Nausea: Secondary | ICD-10-CM

## 2015-02-10 DIAGNOSIS — Z124 Encounter for screening for malignant neoplasm of cervix: Secondary | ICD-10-CM

## 2015-02-10 DIAGNOSIS — Z01419 Encounter for gynecological examination (general) (routine) without abnormal findings: Secondary | ICD-10-CM | POA: Diagnosis not present

## 2015-02-10 DIAGNOSIS — Z1151 Encounter for screening for human papillomavirus (HPV): Secondary | ICD-10-CM | POA: Diagnosis not present

## 2015-02-10 MED ORDER — ONDANSETRON HCL 8 MG PO TABS: 8.0000 mg | ORAL_TABLET | Freq: Three times a day (TID) | ORAL | Status: DC | PRN

## 2015-02-10 NOTE — Patient Instructions (Signed)
Mammogram A mammogram is an X-ray of the breasts that is done to check for abnormal changes. This procedure can screen for and detect any changes that may suggest breast cancer. A mammogram can also identify other changes and variations in the breast, such as:  Inflammation of the breast tissue (mastitis).  An infected area that contains a collection of pus (abscess).  A fluid-filled sac (cyst).  Fibrocystic changes. This is when breast tissue becomes denser, which can make the tissue feel rope-like or uneven under the skin.  Tumors that are not cancerous (benign). LET YOUR HEALTH CARE PROVIDER KNOW ABOUT:  Any allergies you have.  If you have breast implants.  If you have had previous breast disease, biopsy, or surgery.  If you are breastfeeding.  Any possibility that you could be pregnant, if this applies.  If you are younger than age 25.  If you have a family history of breast cancer. RISKS AND COMPLICATIONS Generally, this is a safe procedure. However, problems may occur, including:  Exposure to radiation. Radiation levels are very low with this test.  The results being misinterpreted.  The need for further tests.  The inability of the mammogram to detect certain cancers. BEFORE THE PROCEDURE  Schedule your test about 1-2 weeks after your menstrual period. This is usually when your breasts are the least tender.  If you have had a mammogram done at a different facility in the past, get the mammogram X-rays or have them sent to your current exam facility in order to compare them.  Wash your breasts and under your arms the day of the test.  Do not wear deodorants, perfumes, lotions, or powders anywhere on your body on the day of the test.  Remove any jewelry from your neck.  Wear clothes that you can change into and out of easily. PROCEDURE  You will undress from the waist up and put on a gown.  You will stand in front of the X-ray machine.  Each breast will  be placed between two plastic or glass plates. The plates will compress your breast for a few seconds. Try to stay as relaxed as possible during the procedure. This does not cause any harm to your breasts and any discomfort you feel will be very brief.  X-rays will be taken from different angles of each breast. The procedure may vary among health care providers and hospitals. AFTER THE PROCEDURE  The mammogram will be examined by a specialist (radiologist).  You may need to repeat certain parts of the test, depending on the quality of the images. This is commonly done if the radiologist needs a better view of the breast tissue.  Ask when your test results will be ready. Make sure you get your test results.  You may resume your normal activities.   This information is not intended to replace advice given to you by your health care provider. Make sure you discuss any questions you have with your health care provider.   Document Released: 01/28/2000 Document Revised: 10/21/2014 Document Reviewed: 04/10/2014 Elsevier Interactive Patient Education 2016 Elsevier Inc.  

## 2015-02-10 NOTE — Progress Notes (Signed)
Patient ID: Carrie Ellis, female   DOB: 1965/05/12, 49 y.o.   MRN: [id number]    GYNECOLOGY CLINIC ANNUAL PREVENTATIVE CARE ENCOUNTER NOTE  Subjective:   Carrie Ellis is a 49 y.o. 352P2002 female here for a routine annual gynecologic exam.  Current complaints: nausea before her period.   Denies abnormal vaginal bleeding, discharge, pelvic pain, problems with intercourse or other gynecologic concerns.    Gynecologic History Patient's last menstrual period was 02/04/2015. Contraception: vasectomy Last Pap: 2015. Results were: normal Last mammogram: 2015. Results were: normal  Obstetric History OB History  Gravida Para Term Preterm AB SAB TAB Ectopic Multiple Living  2 2 2       2     # Outcome Date GA Lbr Len/2nd Weight Sex Delivery Anes PTL Lv  2 Term 10/27/95 5240w0d  8 lb 3 oz (3.714 kg) F Vag-Spont   Y     Complications: PPH (postpartum hemorrhage)  1 Term 09/06/91 4263w0d  9 lb 1.5 oz (4.125 kg) F Vag-Spont   Y      Past Medical History  Diagnosis Date  . Allergy   . Hypertension   . Hypothyroid   . Positive PPD, treated     9 months of treatment    Past Surgical History  Procedure Laterality Date  . Cholecystectomy  1999  . Tonsillectomy  1995    Current Outpatient Prescriptions on File Prior to Visit  Medication Sig Dispense Refill  . fluticasone (FLONASE) 50 MCG/ACT nasal spray INSTILL 2 SPRAYS INTO EACH NOSTRIL ONCE DAILY 48 g 3  . losartan (COZAAR) 50 MG tablet Take 1 tablet (50 mg total) by mouth daily. 90 tablet 3  . Multiple Vitamin (MULTIVITAMIN) tablet Take 1 tablet by mouth daily.       No current facility-administered medications on file prior to visit.    Allergies  Allergen Reactions  . Clarithromycin     REACTION: Extreme nausea  . Diclofenac Nausea Only  . Penicillins     REACTION: rash  . Ace Inhibitors Cough    Social History   Social History  . Marital Status: Married    Spouse Name: N/A  . Number of Children: N/A  .  Years of Education: N/A   Occupational History  . 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 Smoking status: Never Smoker   . Smokeless tobacco: Never Used  . Alcohol Use: No  . Drug Use: No  . Sexual Activity:    Partners: Male    Birth Control/ Protection: OCP, Other-see comments     Comment: husband had vasectomy   Other Topics Concern  . Not on file   Social History Narrative    Family History  Problem Relation Age of Onset  . Alcohol abuse Neg Hx   . Mental illness Neg Hx   . Stroke Other   . Diabetes Other   . Cancer Mother   . Heart disease Mother     The following portions of the patient's history were reviewed and updated as appropriate: allergies, current medications, past family history, past medical history, past social history, past surgical history and problem list.  Review of Systems Pertinent items are noted in HPI.   Objective:  BP 153/96 mmHg  Pulse 96  Resp 20  Ht 6\' 1"  (1.854 m)  Wt 276 lb (125.193 kg)  BMI 36.42 kg/m2  LMP 02/04/2015 CONSTITUTIONAL: Well-developed, well-nourished female in no  acute distress.  HENT:  Normocephalic, atraumatic, External right and left ear normal. Oropharynx is clear and moist EYES: Conjunctivae and EOM are normal. Pupils are equal, round, and reactive to light. No scleral icterus.  NECK: Normal range of motion, supple, no masses.  Normal thyroid.  SKIN: Skin is warm and dry. No rash noted. Not diaphoretic. No erythema. No pallor. NEUROLGIC: Alert and oriented to person, place, and time. Normal reflexes, muscle tone coordination. No cranial nerve deficit noted. PSYCHIATRIC: Normal mood and affect. Normal behavior. Normal judgment and thought content. CARDIOVASCULAR: Normal heart rate noted, regular rhythm RESPIRATORY: Clear to auscultation bilaterally. Effort and breath sounds normal, no problems with respiration noted. BREASTS: Symmetric in size. No  masses, skin changes, nipple drainage, or lymphadenopathy. ABDOMEN: Soft, normal bowel sounds, no distention noted.  No tenderness, rebound or guarding.  PELVIC: Normal appearing external genitalia; normal appearing vaginal mucosa and cervix.  No abnormal discharge noted.  Pap smear obtained.  Normal uterine size, no other palpable masses, no uterine or adnexal tenderness. MUSCULOSKELETAL: Normal range of motion. No tenderness.  No cyanosis, clubbing, or edema.  2+ distal pulses.   Assessment:  Annual gynecologic examination with pap smear   Plan:  Will follow up results of pap smear and manage accordingly. Mammogram scheduled Routine preventative health maintenance measures emphasized. Please refer to After Visit Summary for other counseling recommendations.  Zofran  q 8 prn nausea   Illene Bolus CNM [facility] [employer]

## 2015-02-11 LAB — CYTOLOGY - PAP

## 2015-03-05 ENCOUNTER — Ambulatory Visit
Admission: RE | Admit: 2015-03-05 | Discharge: 2015-03-05 | Disposition: A | Discharge status: Home / Self Care | Payer: Federal, State, Local not specified - PPO | Source: Ambulatory Visit | Attending: Certified Nurse Midwife | Admitting: Certified Nurse Midwife

## 2015-03-05 DIAGNOSIS — Z1239 Encounter for other screening for malignant neoplasm of breast: Secondary | ICD-10-CM

## 2015-03-29 ENCOUNTER — Other Ambulatory Visit (INDEPENDENT_AMBULATORY_CARE_PROVIDER_SITE_OTHER): Payer: Federal, State, Local not specified - PPO

## 2015-03-29 ENCOUNTER — Encounter: Payer: Self-pay | Admitting: Family Medicine

## 2015-03-29 ENCOUNTER — Ambulatory Visit (INDEPENDENT_AMBULATORY_CARE_PROVIDER_SITE_OTHER): Payer: Federal, State, Local not specified - PPO | Admitting: Family Medicine

## 2015-03-29 VITALS — BP 136/90 | HR 83 | Temp 98.4°F | Ht 73.0 in | Wt 275.5 lb

## 2015-03-29 DIAGNOSIS — Z13220 Encounter for screening for lipoid disorders: Secondary | ICD-10-CM

## 2015-03-29 DIAGNOSIS — I10 Essential (primary) hypertension: Secondary | ICD-10-CM | POA: Diagnosis not present

## 2015-03-29 DIAGNOSIS — E038 Other specified hypothyroidism: Secondary | ICD-10-CM | POA: Diagnosis not present

## 2015-03-29 DIAGNOSIS — R739 Hyperglycemia, unspecified: Secondary | ICD-10-CM | POA: Diagnosis not present

## 2015-03-29 DIAGNOSIS — R5383 Other fatigue: Secondary | ICD-10-CM

## 2015-03-29 LAB — HEPATIC FUNCTION PANEL
ALBUMIN: 4 g/dL (ref 3.5–5.2)
ALT: 11 U/L (ref 0–35)
AST: 13 U/L (ref 0–37)
Alkaline Phosphatase: 91 U/L (ref 39–117)
BILIRUBIN TOTAL: 0.4 mg/dL (ref 0.2–1.2)
Bilirubin, Direct: 0.1 mg/dL (ref 0.0–0.3)
TOTAL PROTEIN: 7.4 g/dL (ref 6.0–8.3)

## 2015-03-29 LAB — CBC WITH DIFFERENTIAL/PLATELET
BASOS PCT: 0.7 % (ref 0.0–3.0)
Basophils Absolute: 0 10*3/uL (ref 0.0–0.1)
EOS ABS: 0.1 10*3/uL (ref 0.0–0.7)
Eosinophils Relative: 1.7 % (ref 0.0–5.0)
HCT: 41.2 % (ref 36.0–46.0)
HEMOGLOBIN: 13.2 g/dL (ref 12.0–15.0)
Lymphocytes Relative: 37.7 % (ref 12.0–46.0)
Lymphs Abs: 2.1 10*3/uL (ref 0.7–4.0)
MCHC: 32.1 g/dL (ref 30.0–36.0)
MCV: 78.4 fl (ref 78.0–100.0)
MONO ABS: 0.3 10*3/uL (ref 0.1–1.0)
Monocytes Relative: 6.3 % (ref 3.0–12.0)
Neutro Abs: 2.9 10*3/uL (ref 1.4–7.7)
Neutrophils Relative %: 53.6 % (ref 43.0–77.0)
Platelets: 299 10*3/uL (ref 150.0–400.0)
RBC: 5.25 Mil/uL — ABNORMAL HIGH (ref 3.87–5.11)
RDW: 14.4 % (ref 11.5–15.5)
WBC: 5.5 10*3/uL (ref 4.0–10.5)

## 2015-03-29 LAB — LIPID PANEL
CHOLESTEROL: 199 mg/dL (ref 0–200)
HDL: 46 mg/dL (ref >39.00)
LDL CALC: 138 mg/dL — AB (ref 0–99)
NonHDL: 153.35
TRIGLYCERIDES: 76 mg/dL (ref 0.0–149.0)
Total CHOL/HDL Ratio: 4
VLDL: 15.2 mg/dL (ref 0.0–40.0)

## 2015-03-29 LAB — HEMOGLOBIN A1C: HEMOGLOBIN A1C: 6.8 % — AB (ref 4.6–6.5)

## 2015-03-29 LAB — BASIC METABOLIC PANEL
BUN: 7 mg/dL (ref 6–23)
CO2: 25 mEq/L (ref 19–32)
CREATININE: 0.86 mg/dL (ref 0.40–1.20)
Calcium: 9 mg/dL (ref 8.4–10.5)
Chloride: 107 mEq/L (ref 96–112)
GFR: 89.98 mL/min (ref >60.00)
Glucose, Bld: 153 mg/dL — ABNORMAL HIGH (ref 70–99)
Potassium: 3.8 mEq/L (ref 3.5–5.1)
Sodium: 139 mEq/L (ref 135–145)

## 2015-03-29 LAB — TSH: TSH: 0.07 u[IU]/mL — ABNORMAL LOW (ref 0.35–4.50)

## 2015-03-29 MED ORDER — LOSARTAN POTASSIUM 50 MG PO TABS: 50.0000 mg | ORAL_TABLET | Freq: Every day | ORAL | Status: DC

## 2015-03-29 NOTE — Progress Notes (Signed)
Dr. Karleen Hampshire T. Leny Morozov, MD, CAQ Sports Medicine Primary Care and Sports Medicine [address] [place], [address] Phone: [phone] Fax: [phone]  03/29/2015  Patient: Carrie Ellis, MRN: [id number], DOB: 01-21-1966, 50 y.o.  Primary Physician:  Hannah Beat, MD   Chief Complaint  Patient presents with  . Follow-up    Blood Pressure   Subjective:   Carrie Ellis is a 50 y.o. very pleasant female patient who presents with the following:  128/80. On my recheck  HTN: Tolerating all medications without side effects Stable and at goal No CP, no sob. No HA.  BP Readings from Last 3 Encounters:  03/29/15 136/90  02/10/15 153/96  09/21/14 150/98    Basic Metabolic Panel:    Component Value Date/Time   NA 139 10/23/2011 0936   K 4.1 10/23/2011 0936   CL 106 10/23/2011 0936   CO2 24 10/23/2011 0936   BUN 9 10/23/2011 0936   CREATININE 0.8 10/23/2011 0936   GLUCOSE 116* 10/23/2011 0936   CALCIUM 8.7 10/23/2011 0936      Past Medical History, Surgical History, Social History, Family History, Problem List, Medications, and Allergies have been reviewed and updated if relevant.  Patient Active Problem List   Diagnosis Date Noted  . FATIGUE 05/05/2009  . Essential hypertension 12/31/2008  . ALLERGIC RHINITIS 12/31/2008    Past Medical History  Diagnosis Date  . Allergy   . Hypertension   . Hypothyroid   . Positive PPD, treated     9 months of treatment    Past Surgical History  Procedure Laterality Date  . Cholecystectomy  1999  . Tonsillectomy  1995    Social History   Social History  . Marital Status: Married    Spouse Name: N/A  . Number of Children: N/A  . Years of Education: N/A   Occupational History  . 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 Smoking status: Never Smoker   . Smokeless tobacco: Never Used  . Alcohol Use: No  . Drug Use: No  .  Sexual Activity:    Partners: Male    Birth Control/ Protection: OCP, Other-see comments     Comment: husband had vasectomy   Other Topics Concern  . Not on file   Social History Narrative    Family History  Problem Relation Age of Onset  . Alcohol abuse Neg Hx   . Mental illness Neg Hx   . Stroke Other   . Diabetes Other   . Cancer Mother   . Heart disease Mother     Allergies  Allergen Reactions  . Clarithromycin     REACTION: Extreme nausea  . Diclofenac Nausea Only  . Penicillins     REACTION: rash  . Ace Inhibitors Cough    Medication list reviewed and updated in full in [facility].   GEN: No acute illnesses, no fevers, chills. GI: No n/v/d, eating normally Pulm: No SOB Interactive and getting along well at home.  Otherwise, ROS is as per the HPI.  Objective:   BP 136/90 mmHg  Pulse 83  Temp(Src) 98.4 F (36.9 C) (Oral)  Ht  (1.854 m)  Wt 275 lb 8 oz (124.966 kg)  BMI 36.36 kg/m2  LMP 03/02/2015  GEN: WDWN, NAD, Non-toxic, A & O x 3 HEENT: Atraumatic, Normocephalic. Neck supple. No masses, No LAD. Ears and Nose: No external deformity. CV: RRR,  No M/G/R. No JVD. No thrill. No extra heart sounds. PULM: CTA B, no wheezes, crackles, rhonchi. No retractions. No resp. distress. No accessory muscle use. EXTR: No c/c/e NEURO Normal gait.  PSYCH: Normally interactive. Conversant. Not depressed or anxious appearing.  Calm demeanor.   Laboratory and Imaging Data:  Assessment and Plan:   Essential hypertension - Plan: Basic metabolic panel  Other fatigue - Plan: Basic metabolic panel, CBC with Differential/Platelet, Hepatic function panel  Other specified hypothyroidism - Plan: TSH  Screening, lipid - Plan: Lipid panel  BP stable Needs f/u labs  Follow-up: 1 yr  Modified Medications   Modified Medication Previous Medication   LOSARTAN (COZAAR) 50 MG TABLET losartan (COZAAR) 50 MG tablet      Take 1 tablet (50 mg total) by mouth  daily.    Take 1 tablet (50 mg total) by mouth daily.   Orders Placed This Encounter  Procedures  . TSH  . Basic metabolic panel  . CBC with Differential/Platelet  . Hepatic function panel  . Lipid panel    Signed,  Karleen Hampshire T. Kymora Sciara, MD   Patient's Medications  New Prescriptions   No medications on file  Previous Medications   FLUTICASONE (FLONASE) 50 MCG/ACT NASAL SPRAY    INSTILL 2 SPRAYS INTO EACH NOSTRIL ONCE DAILY   MULTIPLE VITAMIN (MULTIVITAMIN) TABLET    Take 1 tablet by mouth daily.     ONDANSETRON (ZOFRAN) 8 MG TABLET    Take 1 tablet (8 mg total) by mouth every 8 (eight) hours as needed for nausea or vomiting.  Modified Medications   Modified Medication Previous Medication   LOSARTAN (COZAAR) 50 MG TABLET losartan (COZAAR) 50 MG tablet      Take 1 tablet (50 mg total) by mouth daily.    Take 1 tablet (50 mg total) by mouth daily.  Discontinued Medications   No medications on file

## 2015-03-29 NOTE — Progress Notes (Signed)
Pre visit review using our clinic review tool, if applicable. No additional management support is needed unless otherwise documented below in the visit note. 

## 2015-04-01 ENCOUNTER — Telehealth: Payer: Self-pay | Admitting: Family Medicine

## 2015-04-01 NOTE — Telephone Encounter (Signed)
Left message for Carrie Ellis to return my call. 

## 2015-04-01 NOTE — Telephone Encounter (Signed)
Please call patient back with lab results at [phone].

## 2015-04-01 NOTE — Telephone Encounter (Signed)
Patient received a message to call back and speak to Dr.Copland or Lupita Leash.

## 2015-04-01 NOTE — Telephone Encounter (Signed)
See Result Note 03/29/2015.

## 2015-04-01 NOTE — Telephone Encounter (Signed)
Can you call her - see my result note from labs - new onset DM

## 2015-04-03 ENCOUNTER — Other Ambulatory Visit: Payer: Self-pay | Admitting: Family Medicine

## 2015-04-14 ENCOUNTER — Ambulatory Visit (INDEPENDENT_AMBULATORY_CARE_PROVIDER_SITE_OTHER): Payer: Federal, State, Local not specified - PPO | Admitting: Family Medicine

## 2015-04-14 ENCOUNTER — Encounter: Payer: Self-pay | Admitting: Family Medicine

## 2015-04-14 VITALS — BP 114/76 | HR 99 | Temp 99.3°F | Ht 73.0 in | Wt 270.2 lb

## 2015-04-14 DIAGNOSIS — E119 Type 2 diabetes mellitus without complications: Secondary | ICD-10-CM | POA: Insufficient documentation

## 2015-04-14 DIAGNOSIS — E1169 Type 2 diabetes mellitus with other specified complication: Secondary | ICD-10-CM

## 2015-04-14 DIAGNOSIS — E669 Obesity, unspecified: Secondary | ICD-10-CM

## 2015-04-14 DIAGNOSIS — R7989 Other specified abnormal findings of blood chemistry: Secondary | ICD-10-CM

## 2015-04-14 HISTORY — DX: Type 2 diabetes mellitus with other specified complication: E11.69

## 2015-04-14 LAB — TSH: TSH: 0.03 u[IU]/mL — AB (ref 0.35–4.50)

## 2015-04-14 LAB — T3, FREE: T3 FREE: 3.3 pg/mL (ref 2.3–4.2)

## 2015-04-14 LAB — T4, FREE: FREE T4: 1.14 ng/dL (ref 0.60–1.60)

## 2015-04-14 MED ORDER — METFORMIN HCL ER 500 MG PO TB24: 500.0000 mg | ORAL_TABLET | Freq: Every day | ORAL | Status: DC

## 2015-04-14 NOTE — Progress Notes (Signed)
Pre visit review using our clinic review tool, if applicable. No additional management support is needed unless otherwise documented below in the visit note. 

## 2015-04-14 NOTE — Patient Instructions (Signed)
American Diabetes Association    [facility] Low Glycemic Diet (Source: [facility], 2006)  Low Glycemic Foods (20-49) (Decrease risk of developing heart disease)  Best for Diabetes: Eat Mostly these  Breakfast Cereals: All-Bran All-Bran Fruit 'n Oats Fiber One Oatmeal (not instant) Oat bran  Fruits and fruit juices: (Limit to 1-2 servings per day) Apples Apricots (fresh & dried) Blackberries Blueberries Cherries Cranberries Peaches Pears Plums Prunes Grapefruit Raspberries Strawberries Tangerine  Juices: Apple juice Grapefruit juice Tomato juice  Beans and legumes (fresh-cooked): Black-eyed peas Butter beans Chick peas Lentils  Green beans Lima beans Kidney beans Navy beans Pinto beans Snow peas  Non-starchy vegetables: Asparagus, avocado, broccoli, cabbage, cauliflower, celery, cucumber, greens, lettuce, mushrooms, peppers, tomatoes, okra, onions, spinach, summer squash  Grains: Barley Bulgur Rye Wild rice  Nuts and oils : Almonds Peanuts Sunflower seeds Hazelnuts Pecans Walnuts Oils that are liquid at room temperature  Dairy, fish, meat, soy, and eggs: Milk, skim Lowfat cheese Yogurt, lowfat, fruit sugar sweetened Lean red meat Fish  Skinless chicken & [place] Shellfish Egg whites (up to 3 daily) Soy products  Egg yolks (up to 7 or _____ per week) Moderate Glycemic Foods (50-69)  OK sometimes with diabetes  Breakfast Cereals: Bran Buds Bran Chex Just Right Mini-Wheats  Special K Swiss muesli  Fruits: Banana (under-ripe) Dates Figs Grapes Kiwi Mango Oranges Raisins  Fruit Juices: Cranberry juice Orange juice  Beans and legumes: [place]-type baked beans Canned pinto, kidney, or navy beans Green peas  Vegetables: Beets Carrots  Sweet potato Yam Corn on the cob  Breads: Pita (pocket) bread Oat bran bread Pumpernickel bread Rye bread Wheat bread, high fiber   Grains: Cornmeal Rice, brown Rice, white  Couscous  Pasta: Macaroni Pizza, cheese Ravioli, meat filled Spaghetti, white   Nuts: Cashews Macadamia  Snacks: Chocolate Ice cream, lowfat Muffin Popcorn High Glycemic Foods (70-100)  Rare: Eat occaisionally with diabetes  THESE ARE THE WORST KIND OF FOODS FOR YOUR DIABETES  Breakfast Cereals: Cheerios Corn Chex Corn Flakes Cream of Wheat Grape Nuts Grape Nut Flakes Grits Nutri-Grain Puffed Rice Puffed Wheat Rice Chex Rice Krispies Shredded Wheat Team Total  Fruits: Pineapple Watermelon Banana (over-ripe) Beverages: Sodas, sweet tea, pineapple juice  Vegetables: Potato, baked, boiled, fried, mashed [place] fries Canned or frozen corn Parsnips Winter squash  Breads: Most breads (white and whole grain) Bagels Bread sticks Bread stuffing Kaiser roll Dinner rolls  Grains: Rice, instant Tapioca, with milk Candy and most cookies  Snacks: Donuts Corn chips Jelly beans Pretzels Pastries

## 2015-04-14 NOTE — Progress Notes (Signed)
Dr. Karleen Hampshire T. Tangee Marszalek, MD, CAQ Sports Medicine Primary Care and Sports Medicine [address] [place], [address] Phone: [phone] Fax: [phone]  04/14/2015  Patient: Carrie Ellis, MRN: [id number], DOB: Aug 11, 1965, 50 y.o.  Primary Physician:  Hannah Beat, MD   Chief Complaint  Patient presents with  . Diabetes    New Dx   Subjective:   Carrie Ellis is a 50 y.o. very pleasant female patient who presents with the following:  New onset DM:  Wt Readings from Last 3 Encounters:  04/14/15 270 lb 4 oz (122.585 kg)  03/29/15 275 lb 8 oz (124.966 kg)  02/10/15 276 lb (125.193 kg)    Lab Results  Component Value Date   TSH 0.07* 03/29/2015    See below  Past Medical History, Surgical History, Social History, Family History, Problem List, Medications, and Allergies have been reviewed and updated if relevant.  Patient Active Problem List   Diagnosis Date Noted  . Diabetes mellitus type 2 in obese (HCC) 04/14/2015  . Fatigue 05/05/2009  . Essential hypertension 12/31/2008  . ALLERGIC RHINITIS 12/31/2008    Past Medical History  Diagnosis Date  . Allergy   . Hypertension   . Hypothyroid   . Positive PPD, treated     9 months of treatment  . Diabetes mellitus type 2 in obese (HCC) 04/14/2015    Past Surgical History  Procedure Laterality Date  . Cholecystectomy  1999  . Tonsillectomy  1995    Social History   Social History  . Marital Status: Married    Spouse Name: N/A  . Number of Children: N/A  . Years of Education: N/A   Occupational History  . 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 Smoking status: Never Smoker   . Smokeless tobacco: Never Used  . Alcohol Use: No  . Drug Use: No  . Sexual Activity:    Partners: Male    Birth Control/ Protection: OCP, Other-see comments     Comment: husband had vasectomy   Other Topics Concern  . Not on file    Social History Narrative    Family History  Problem Relation Age of Onset  . Alcohol abuse Neg Hx   . Mental illness Neg Hx   . Stroke Other   . Diabetes Other   . Cancer Mother   . Heart disease Mother     Allergies  Allergen Reactions  . Clarithromycin     REACTION: Extreme nausea  . Diclofenac Nausea Only  . Penicillins     REACTION: rash  . Ace Inhibitors Cough    Medication list reviewed and updated in full in [facility].   GEN: No acute illnesses, no fevers, chills. GI: No n/v/d, eating normally Pulm: No SOB Interactive and getting along well at home.  Otherwise, ROS is as per the HPI.  Objective:   BP 114/76 mmHg  Pulse 99  Temp(Src) 99.3 F (37.4 C) (Oral)  Ht  (1.854 m)  Wt 270 lb 4 oz (122.585 kg)  BMI 35.66 kg/m2  LMP 03/30/2015  GEN: WDWN, NAD, Non-toxic, A & O x 3 HEENT: Atraumatic, Normocephalic. Neck supple. No masses, No LAD. Ears and Nose: No external deformity. CV: RRR, No M/G/R. No JVD. No thrill. No extra heart sounds. PULM: CTA B, no wheezes, crackles, rhonchi. No retractions. No resp. distress. No accessory muscle use. EXTR: No c/c/e NEURO Normal  gait.  PSYCH: Normally interactive. Conversant. Not depressed or anxious appearing.  Calm demeanor.   Laboratory and Imaging Data: Results for orders placed or performed in visit on 03/29/15  Hemoglobin A1c  Result Value Ref Range   Hgb A1c MFr Bld 6.8 (H) 4.6 - 6.5 %     Assessment and Plan:   Diabetes mellitus type 2 in obese (HCC) - Plan: Amb ref to Medical Nutrition Therapy-MNT  Abnormal TSH - Plan: TSH, T3, free, T4, free  >25 minutes spent in face to face time with patient, >50% spent in counselling or coordination of care: new onset DM discussion regarding pathology, treatment, diet, exercise.   Consult dietician, start metformin.   Recheck thyroid labs given low tsh on last ov.   Follow-up: 3 mo  New Prescriptions   METFORMIN (GLUCOPHAGE-XR) 500 MG 24 HR  TABLET    Take 1 tablet (500 mg total) by mouth daily with breakfast.   Modified Medications   No medications on file   Orders Placed This Encounter  Procedures  . TSH  . T3, free  . T4, free  . Amb ref to Medical Nutrition Therapy-MNT   Patient Instructions   American Diabetes Association    [facility] Low Glycemic Diet (Source: [facility], 2006)  Low Glycemic Foods (20-49) (Decrease risk of developing heart disease)  Best for Diabetes: Eat Mostly these  Breakfast Cereals: All-Bran All-Bran Fruit 'n Oats Fiber One Oatmeal (not instant) Oat bran  Fruits and fruit juices: (Limit to 1-2 servings per day) Apples Apricots (fresh & dried) Blackberries Blueberries Cherries Cranberries Peaches Pears Plums Prunes Grapefruit Raspberries Strawberries Tangerine  Juices: Apple juice Grapefruit juice Tomato juice  Beans and legumes (fresh-cooked): Black-eyed peas Butter beans Chick peas Lentils  Green beans Lima beans Kidney beans Navy beans Pinto beans Snow peas  Non-starchy vegetables: Asparagus, avocado, broccoli, cabbage, cauliflower, celery, cucumber, greens, lettuce, mushrooms, peppers, tomatoes, okra, onions, spinach, summer squash  Grains: Barley Bulgur Rye Wild rice  Nuts and oils : Almonds Peanuts Sunflower seeds Hazelnuts Pecans Walnuts Oils that are liquid at room temperature  Dairy, fish, meat, soy, and eggs: Milk, skim Lowfat cheese Yogurt, lowfat, fruit sugar sweetened Lean red meat Fish  Skinless chicken & [place] Shellfish Egg whites (up to 3 daily) Soy products  Egg yolks (up to 7 or _____ per week) Moderate Glycemic Foods (50-69)  OK sometimes with diabetes  Breakfast Cereals: Bran Buds Bran Chex Just Right Mini-Wheats  Special K Swiss muesli  Fruits: Banana (under-ripe) Dates Figs Grapes Kiwi Mango Oranges Raisins  Fruit Juices: Cranberry juice Orange juice  Beans and legumes: [place]-type baked  beans Canned pinto, kidney, or navy beans Green peas  Vegetables: Beets Carrots  Sweet potato Yam Corn on the cob  Breads: Pita (pocket) bread Oat bran bread Pumpernickel bread Rye bread Wheat bread, high fiber   Grains: Cornmeal Rice, brown Rice, white Couscous  Pasta: Macaroni Pizza, cheese Ravioli, meat filled Spaghetti, white   Nuts: Cashews Macadamia  Snacks: Chocolate Ice cream, lowfat Muffin Popcorn High Glycemic Foods (70-100)  Rare: Eat occaisionally with diabetes  THESE ARE THE WORST KIND OF FOODS FOR YOUR DIABETES  Breakfast Cereals: Cheerios Corn Chex Corn Flakes Cream of Wheat Grape Nuts Grape Nut Flakes Grits Nutri-Grain Puffed Rice Puffed Wheat Rice Chex Rice Krispies Shredded Wheat Team Total  Fruits: Pineapple Watermelon Banana (over-ripe) Beverages: Sodas, sweet tea, pineapple juice  Vegetables: Potato, baked, boiled, fried, mashed [place] fries Canned or frozen corn  Parsnips Winter squash  Breads: Most breads (white and whole grain) Bagels Bread sticks Bread stuffing Kaiser roll Dinner rolls  Grains: Rice, instant Tapioca, with milk Candy and most cookies  Snacks: Donuts Corn chips Jelly beans Pretzels Pastries         Signed,  Anneliese Leblond T. Pascal Stiggers, MD   Patient's Medications  New Prescriptions   METFORMIN (GLUCOPHAGE-XR) 500 MG 24 HR TABLET    Take 1 tablet (500 mg total) by mouth daily with breakfast.  Previous Medications   FLUTICASONE (FLONASE) 50 MCG/ACT NASAL SPRAY    INSTILL 2 SPRAYS INTO EACH NOSTRIL ONCE DAILY   LOSARTAN (COZAAR) 50 MG TABLET    Take 1 tablet (50 mg total) by mouth daily.   MULTIPLE VITAMIN (MULTIVITAMIN) TABLET    Take 1 tablet by mouth daily.     ONDANSETRON (ZOFRAN) 8 MG TABLET    Take 1 tablet (8 mg total) by mouth every 8 (eight) hours as needed for nausea or vomiting.  Modified Medications   No medications on file  Discontinued Medications   No medications on file

## 2015-04-27 ENCOUNTER — Telehealth: Payer: Self-pay | Admitting: Family Medicine

## 2015-04-27 NOTE — Telephone Encounter (Signed)
Pt returned call from CMA, read lab notes from Dr. Patsy Lageropland, pt did not have any questions.  She is going to contact her previous endocrinologist.  Offered to make appointment with [facility], pt states she will call back if her current endo is not in practice any longer and we can put referral in as needed.

## 2015-07-19 ENCOUNTER — Ambulatory Visit: Payer: Federal, State, Local not specified - PPO | Admitting: Family Medicine

## 2015-07-22 ENCOUNTER — Encounter: Payer: Federal, State, Local not specified - PPO | Attending: Family Medicine

## 2015-07-22 VITALS — Ht 74.0 in | Wt 259.0 lb

## 2015-07-22 DIAGNOSIS — E119 Type 2 diabetes mellitus without complications: Secondary | ICD-10-CM

## 2015-07-22 DIAGNOSIS — E669 Obesity, unspecified: Secondary | ICD-10-CM | POA: Diagnosis not present

## 2015-07-22 DIAGNOSIS — Z713 Dietary counseling and surveillance: Secondary | ICD-10-CM | POA: Insufficient documentation

## 2015-07-23 NOTE — Progress Notes (Signed)
Patient was seen on 07/22/2015 for the first of a series of three diabetes self-management courses at the [facility].  Patient Education Plan per assessed needs and concerns is to attend four course education program for Diabetes Self Management Education.  The following learning objectives were met by the patient during this class:  Describe diabetes  State some common risk factors for diabetes  Defines the role of glucose and insulin  Identifies type of diabetes and pathophysiology  Describe the relationship between diabetes and cardiovascular risk  State the members of the Healthcare Team  States the rationale for glucose monitoring  State when to test glucose  State their individual Target Range  State the importance of logging glucose readings  Describe how to interpret glucose readings  Identifies A1C target  Explain the correlation between A1c and eAG values  State symptoms and treatment of high blood glucose  State symptoms and treatment of low blood glucose  Explain proper technique for glucose testing  Identifies proper sharps disposal  Handouts given during class include:  Living Well with Diabetes book  Carb Counting and Meal Planning book  Meal Plan Card  Carbohydrate guide  Meal planning worksheet  Low Sodium Flavoring Tips  The diabetes portion plate  N0N to eAG Conversion Chart  Diabetes Medications  Diabetes Recommended Care Schedule  Support Group  Diabetes Success Plan  Core Class Satisfaction Survey  Follow-Up Plan:  Attend core 2

## 2015-07-29 DIAGNOSIS — E119 Type 2 diabetes mellitus without complications: Secondary | ICD-10-CM

## 2015-07-29 DIAGNOSIS — E669 Obesity, unspecified: Secondary | ICD-10-CM | POA: Diagnosis not present

## 2015-07-29 DIAGNOSIS — Z713 Dietary counseling and surveillance: Secondary | ICD-10-CM | POA: Diagnosis not present

## 2015-07-29 NOTE — Progress Notes (Signed)
Patient was seen on 07/29/15 for the second and third classes of a series of three diabetes self-management courses at the [facility]. The following learning objectives were met by the patient during this class:   Describe the role of different macronutrients on glucose  Explain how carbohydrates affect blood glucose  State what foods contain the most carbohydrates  Demonstrate carbohydrate counting  Demonstrate how to read Nutrition Facts food label  Describe effects of various fats on heart health  Describe the importance of good nutrition for health and healthy eating strategies  Describe techniques for managing your shopping, cooking and meal planning  List strategies to follow meal plan when dining out  Describe the effects of alcohol on glucose and how to use it safely  Goals:  Follow Diabetes Meal Plan as instructed  Eat 3 meals and 2 snacks, every 3-5 hrs  Limit carbohydrate intake to 45 grams carbohydrate/meal Limit carbohydrate intake to 15 grams carbohydrate/snack Add lean protein foods to meals/snacks  Monitor glucose levels as instructed by your doctor   . State the amount of activity recommended for healthy living . Describe activities suitable for individual needs . Identify ways to regularly incorporate activity into daily life . Identify barriers to activity and ways to over come these barriers  Identify diabetes medications being personally used and their primary action for lowering glucose and possible side effects . Describe role of stress on blood glucose and develop strategies to address psychosocial issues . Identify diabetes complications and ways to prevent them  Explain how to manage diabetes during illness . Evaluate success in meeting personal goal . Establish 2-3 goals that they will plan to diligently work on until they return for the  27-monthfollow-up visit  Goals:   I will count my carb choices at most meals  and snacks  I will be active 40 minutes or more 5 times a week  I will test my glucose at least 1 times a day, 2-3 days a week  Your patient has identified these potential barriers to change:  Finances  Your patient has identified their diabetes self-care support plan as  [place] Plan:  Attend Support Group as desired

## 2015-08-05 ENCOUNTER — Ambulatory Visit: Payer: Federal, State, Local not specified - PPO

## 2015-10-06 ENCOUNTER — Encounter: Payer: Self-pay | Admitting: Family Medicine

## 2015-10-06 ENCOUNTER — Encounter: Payer: Self-pay | Admitting: *Deleted

## 2015-10-06 ENCOUNTER — Ambulatory Visit (INDEPENDENT_AMBULATORY_CARE_PROVIDER_SITE_OTHER): Payer: Federal, State, Local not specified - PPO | Admitting: Family Medicine

## 2015-10-06 VITALS — BP 120/80 | HR 104 | Temp 98.6°F | Ht 73.0 in | Wt 248.8 lb

## 2015-10-06 DIAGNOSIS — E669 Obesity, unspecified: Secondary | ICD-10-CM | POA: Diagnosis not present

## 2015-10-06 DIAGNOSIS — E1169 Type 2 diabetes mellitus with other specified complication: Secondary | ICD-10-CM

## 2015-10-06 DIAGNOSIS — E119 Type 2 diabetes mellitus without complications: Secondary | ICD-10-CM | POA: Diagnosis not present

## 2015-10-06 DIAGNOSIS — Z23 Encounter for immunization: Secondary | ICD-10-CM | POA: Diagnosis not present

## 2015-10-06 LAB — HEMOGLOBIN A1C: HEMOGLOBIN A1C: 6.1 % (ref 4.6–6.5)

## 2015-10-06 MED ORDER — PNEUMOCOCCAL VAC POLYVALENT 25 MCG/0.5ML IJ INJ: 0.5000 mL | INJECTION | Freq: Once | INTRAMUSCULAR | Status: DC

## 2015-10-06 MED ORDER — LOSARTAN POTASSIUM 50 MG PO TABS: 50.0000 mg | ORAL_TABLET | Freq: Every day | ORAL | 3 refills | Status: DC

## 2015-10-06 MED ORDER — METFORMIN HCL ER 500 MG PO TB24: 500.0000 mg | ORAL_TABLET | Freq: Every day | ORAL | 3 refills | Status: DC

## 2015-10-06 NOTE — Progress Notes (Signed)
Pre visit review using our clinic review tool, if applicable. No additional management support is needed unless otherwise documented below in the visit note. 

## 2015-10-06 NOTE — Progress Notes (Signed)
Dr. Karleen HampshireSpencer T. Aleiah Mohammed, MD, CAQ [facility] [address] [place], [address] Phone: [phone] Fax: [phone]  10/06/2015  Patient: Carrie NephewKimberly R Ellis, MRN: [id number], DOB: January 17, 1966, 50 y.o.  Primary Physician:  Hannah BeatSpencer Shanterria Franta, MD   Chief Complaint  Patient presents with  . Follow-up    3 month   Subjective:   Carrie LaughterKimberly R Borden is a 50 y.o. very pleasant female patient who presents with the following:  Immunization History  Administered Date(s) Administered  . Influenza Split 11/07/2011  . Influenza,inj,Quad PF,36+ Mos 11/10/2013, 11/20/2014, 10/06/2015  . Pneumococcal Polysaccharide-23 10/06/2015  . Td 02/14/2004    Wt Readings from Last 3 Encounters:  10/06/15 248 lb 12 oz (112.8 kg)  07/22/15 259 lb (117.5 kg)  04/14/15 270 lb 4 oz (122.6 kg)    Diabetes Mellitus: Tolerating Medications: yes Compliance with diet: good Exercise: minimal / intermittent Avg blood sugars at home: < 120 Foot problems: none Hypoglycemia: none No nausea, vomitting, blurred vision, polyuria.  Lab Results  Component Value Date   HGBA1C 6.1 10/06/2015   HGBA1C 6.8 (H) 03/29/2015   Lab Results  Component Value Date   LDLCALC 138 (H) 03/29/2015   CREATININE 0.86 03/29/2015    Wt Readings from Last 3 Encounters:  10/06/15 248 lb 12 oz (112.8 kg)  07/22/15 259 lb (117.5 kg)  04/14/15 270 lb 4 oz (122.6 kg)    Body mass index is 32.82 kg/m.   Past Medical History, Surgical History, Social History, Family History, Problem List, Medications, and Allergies have been reviewed and updated if relevant.  Patient Active Problem List   Diagnosis Date Noted  . Diabetes mellitus type 2 in obese (HCC) 04/14/2015  . Fatigue 05/05/2009  . Essential hypertension 12/31/2008  . ALLERGIC RHINITIS 12/31/2008    Past Medical History:  Diagnosis Date  . Allergy   . Diabetes mellitus type 2 in obese (HCC) 04/14/2015  . Hypertension     . Hypothyroid   . Positive PPD, treated    9 months of treatment    Past Surgical History:  Procedure Laterality Date  . CHOLECYSTECTOMY  1999  . TONSILLECTOMY  1995    Social History   Social History  . Marital status: Married    Spouse name: N/A  . Number of children: N/A  . Years of education: N/A   Occupational History  . 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 Smoking status: Never Smoker  . Smokeless tobacco: Never Used  . Alcohol use No  . Drug use: No  . Sexual activity: Not Currently    Partners: Male    Birth control/ protection: OCP, Other-see comments     Comment: husband had vasectomy   Other Topics Concern  . Not on file   Social History Narrative  . No narrative on file    Family History  Problem Relation Age of Onset  . Alcohol abuse Neg Hx   . Mental illness Neg Hx   . Stroke Other   . Diabetes Other   . Cancer Mother   . Heart disease Mother     Allergies  Allergen Reactions  . Clarithromycin     REACTION: Extreme nausea  . Diclofenac Nausea Only  . Penicillins     REACTION: rash  . Ace Inhibitors Cough    Medication list reviewed and updated in full in [facility].  GEN: No acute illnesses, no fevers, chills. GI: No n/v/d, eating normally Pulm: No SOB Interactive and getting along well at home.  Otherwise, ROS is as per the HPI.  Objective:   BP 120/80   Pulse (!) 104   Temp 98.6 F (37 C) (Oral)   Ht 6\' 1"  (1.854 m)   Wt 248 lb 12 oz (112.8 kg)   BMI 32.82 kg/m   GEN: WDWN, NAD, Non-toxic, A & O x 3 HEENT: Atraumatic, Normocephalic. Neck supple. No masses, No LAD. Ears and Nose: No external deformity. CV: RRR, No M/G/R. No JVD. No thrill. No extra heart sounds. PULM: CTA B, no wheezes, crackles, rhonchi. No retractions. No resp. distress. No accessory muscle use. EXTR: No c/c/e NEURO Normal gait.  PSYCH: Normally interactive. Conversant.  Not depressed or anxious appearing.  Calm demeanor.   Laboratory and Imaging Data: Results for orders placed or performed in visit on 10/06/15  Hemoglobin A1c  Result Value Ref Range   Hgb A1c MFr Bld 6.1 4.6 - 6.5 %     Assessment and Plan:   Diabetes mellitus type 2 in obese (HCC) - Plan: Hemoglobin A1c  Need for prophylactic vaccination and inoculation against influenza - Plan: Flu Vaccine QUAD 36+ mos PF IM (Fluarix & Fluzone Quad PF)  Need for prophylactic vaccination against Streptococcus pneumoniae (pneumococcus) - Plan: Pneumococcal polysaccharide vaccine 23-valent greater than or equal to 2yo subcutaneous/IM, DISCONTINUED: pneumococcal 23 valent vaccine (PNU-IMMUNE) injection 0.5 mL  Great control with new onset dm  Follow-up: 6 mo  Modified Medications   Modified Medication Previous Medication   LOSARTAN (COZAAR) 50 MG TABLET losartan (COZAAR) 50 MG tablet      Take 1 tablet (50 mg total) by mouth daily.    Take 1 tablet (50 mg total) by mouth daily.   METFORMIN (GLUCOPHAGE-XR) 500 MG 24 HR TABLET metFORMIN (GLUCOPHAGE-XR) 500 MG 24 hr tablet      Take 1 tablet (500 mg total) by mouth daily with breakfast.    Take 1 tablet (500 mg total) by mouth daily with breakfast.   Orders Placed This Encounter  Procedures  . Flu Vaccine QUAD 36+ mos PF IM (Fluarix & Fluzone Quad PF)  . Pneumococcal polysaccharide vaccine 23-valent greater than or equal to 2yo subcutaneous/IM  . Hemoglobin A1c    Signed,  Brittin Belnap T. Wai Litt, MD   Patient's Medications  New Prescriptions   No medications on file  Previous Medications   FLUTICASONE (FLONASE) 50 MCG/ACT NASAL SPRAY    INSTILL 2 SPRAYS INTO EACH NOSTRIL ONCE DAILY   MULTIPLE VITAMIN (MULTIVITAMIN) TABLET    Take 1 tablet by mouth daily.     ONDANSETRON (ZOFRAN) 8 MG TABLET    Take 1 tablet (8 mg total) by mouth every 8 (eight) hours as needed for nausea or vomiting.  Modified Medications   Modified Medication Previous  Medication   LOSARTAN (COZAAR) 50 MG TABLET losartan (COZAAR) 50 MG tablet      Take 1 tablet (50 mg total) by mouth daily.    Take 1 tablet (50 mg total) by mouth daily.   METFORMIN (GLUCOPHAGE-XR) 500 MG 24 HR TABLET metFORMIN (GLUCOPHAGE-XR) 500 MG 24 hr tablet      Take 1 tablet (500 mg total) by mouth daily with breakfast.    Take 1 tablet (500 mg total) by mouth daily with breakfast.  Discontinued Medications   No medications on file

## 2015-10-19 ENCOUNTER — Other Ambulatory Visit: Payer: Self-pay | Admitting: Family Medicine

## 2016-01-05 LAB — HM DIABETES EYE EXAM

## 2016-01-11 ENCOUNTER — Encounter: Payer: Self-pay | Admitting: Family Medicine

## 2016-02-11 ENCOUNTER — Encounter: Payer: Self-pay | Admitting: Family Medicine

## 2016-02-11 ENCOUNTER — Ambulatory Visit (INDEPENDENT_AMBULATORY_CARE_PROVIDER_SITE_OTHER): Payer: Federal, State, Local not specified - PPO | Admitting: Family Medicine

## 2016-02-11 VITALS — BP 142/88 | HR 91 | Ht 73.0 in | Wt 258.0 lb

## 2016-02-11 DIAGNOSIS — E1169 Type 2 diabetes mellitus with other specified complication: Secondary | ICD-10-CM

## 2016-02-11 DIAGNOSIS — I10 Essential (primary) hypertension: Secondary | ICD-10-CM | POA: Diagnosis not present

## 2016-02-11 DIAGNOSIS — Z01419 Encounter for gynecological examination (general) (routine) without abnormal findings: Secondary | ICD-10-CM | POA: Diagnosis not present

## 2016-02-11 DIAGNOSIS — E669 Obesity, unspecified: Secondary | ICD-10-CM | POA: Diagnosis not present

## 2016-02-11 NOTE — Patient Instructions (Signed)

## 2016-02-11 NOTE — Progress Notes (Signed)
   CLINIC ENCOUNTER NOTE  History:  50 y.o. [id number] here today for  Chief Complaint  Patient presents with  . Gynecologic Exam   She denies any abnormal vaginal discharge, bleeding, pelvic pain or other concerns.   Past Medical History:  Diagnosis Date  . Allergy   . Diabetes mellitus type 2 in obese (HCC) 04/14/2015  . Hypertension   . Hypothyroid   . Positive PPD, treated    9 months of treatment    Past Surgical History:  Procedure Laterality Date  . CHOLECYSTECTOMY  1999  . TONSILLECTOMY  1995    The following portions of the patient's history were reviewed and updated as appropriate: allergies, current medications, past family history, past medical history, past social history, past surgical history and problem list.   Health Maintenance:  Normal pap and negative HRHPV on 02/10/2015.  Normal mammogram on 03/05/2015  Health Maintenance Due  Topic Date Due  . FOOT EXAM  08/31/1975  . HIV Screening  08/30/1980  . TETANUS/TDAP  02/13/2014  . COLONOSCOPY  08/31/2015    Review of Systems:  Pertinent items noted in HPI and remainder of comprehensive ROS otherwise negative.   Objective:  Physical Exam BP (!) 142/88   Pulse 91   Ht 6\' 1"  (1.854 m)   Wt 258 lb (117 kg)   LMP 01/21/2016   BMI 34.04 kg/m  CONSTITUTIONAL: Well-developed, well-nourished female in no acute distress.  HENT:  Normocephalic, atraumatic. External right and left ear normal. Oropharynx is clear and moist EYES: Conjunctivae and EOM are normal. Pupils are equal, round, and reactive to light. No scleral icterus.  NECK: Normal range of motion, supple, no masses SKIN: Skin is warm and dry. No rash noted. Not diaphoretic. No erythema. No pallor. BREAST: no nipple discharge, retraction, skin changes. No masses palpated NEUROLGIC: Alert and oriented to person, place, and time. Normal reflexes, muscle tone coordination. No cranial nerve deficit noted. PSYCHIATRIC: Normal mood and affect. Normal  behavior. Normal judgment and thought content. CARDIOVASCULAR: Normal heart rate noted RESPIRATORY: Effort and breath sounds normal, no problems with respiration noted ABDOMEN: Soft, no distention noted.   PELVIC: Normal appearing external genitalia; normal appearing vaginal mucosa..  No abnormal discharge noted.  Normal uterine size, no other palpable masses, no uterine or adnexal tenderness. MUSCULOSKELETAL: Normal range of motion. No edema noted.  Labs and Imaging No results found.  Assessment & Plan:   1. Essential hypertension BP elevated, encouraged PCP follow up  2. Diabetes mellitus type 2 in obese [facility](HCC) Discussed importance of PCP managment Needs foot exam  3. Hypothyroid Defer to PCP managment  3. Well woman exam with routine gynecological exam Pap and mammogram are UTD Reviewed health maintenance measures including Td vaccination and need for colon cancer screening Reviewed healthy diet/weight loss Discussed approaching menopause and s/sx of menopause  Reviewed that when menses has stopped for 1 year the patient will have gone through menopause and any bleeding after this needs to be evaluated by an MD Declined HIV screening/STI screening  Routine preventative health maintenance measures emphasized. Please refer to After Visit Summary for other counseling recommendations.   Return in about 1 year (around 02/10/2017) for Yearly exam.

## 2016-02-15 ENCOUNTER — Encounter: Payer: Self-pay | Admitting: *Deleted

## 2016-03-20 ENCOUNTER — Ambulatory Visit: Payer: Federal, State, Local not specified - PPO | Admitting: Family Medicine

## 2016-03-22 ENCOUNTER — Encounter: Payer: Self-pay | Admitting: Family Medicine

## 2016-03-22 ENCOUNTER — Ambulatory Visit (INDEPENDENT_AMBULATORY_CARE_PROVIDER_SITE_OTHER): Payer: 59 | Admitting: Family Medicine

## 2016-03-22 VITALS — BP 132/70 | HR 104 | Temp 99.2°F | Ht 73.0 in | Wt 258.5 lb

## 2016-03-22 DIAGNOSIS — Z23 Encounter for immunization: Secondary | ICD-10-CM | POA: Diagnosis not present

## 2016-03-22 DIAGNOSIS — I10 Essential (primary) hypertension: Secondary | ICD-10-CM

## 2016-03-22 DIAGNOSIS — E1169 Type 2 diabetes mellitus with other specified complication: Secondary | ICD-10-CM | POA: Diagnosis not present

## 2016-03-22 DIAGNOSIS — E669 Obesity, unspecified: Secondary | ICD-10-CM

## 2016-03-22 LAB — HEMOGLOBIN A1C: Hgb A1c MFr Bld: 6.2 % (ref 4.6–6.5)

## 2016-03-22 MED ORDER — LOSARTAN POTASSIUM 50 MG PO TABS: 50.0000 mg | ORAL_TABLET | Freq: Every day | ORAL | 3 refills | Status: DC

## 2016-03-22 MED ORDER — METFORMIN HCL ER 500 MG PO TB24: ORAL_TABLET | ORAL | 3 refills | Status: DC

## 2016-03-22 NOTE — Progress Notes (Signed)
Dr. Karleen Hampshire T. Trenten Watchman, MD, CAQ Sports Medicine Primary [facility] and Sports Medicine [address] [place], [address] Phone: [phone] Fax: [phone]  03/22/2016  Patient: Carrie Ellis, MRN: [id number], DOB: October 04, 1965, 51 y.o.  Primary Physician:  Hannah Beat, MD   Chief Complaint  Patient presents with  . Medication Refill    Losartan   Subjective:   Carrie Ellis is a 51 y.o. very pleasant female patient who presents with the following:  Diabetes Mellitus: Tolerating Medications: yes Compliance with diet: fairly good Exercise: minimal / intermittent Avg blood sugars at home: checking q 2 weeks, ok  Foot problems: none Hypoglycemia: none No nausea, vomitting, blurred vision, polyuria.  Lab Results  Component Value Date   HGBA1C 6.2 03/22/2016   HGBA1C 6.1 10/06/2015   HGBA1C 6.8 (H) 03/29/2015   Lab Results  Component Value Date   LDLCALC 138 (H) 03/29/2015   CREATININE 0.86 03/29/2015    Wt Readings from Last 3 Encounters:  03/22/16 258 lb 8 oz (117.3 kg)  02/11/16 258 lb (117 kg)  10/06/15 248 lb 12 oz (112.8 kg)    Body mass index is 34.1 kg/m.   HTN: Tolerating all medications without side effects Stable and at goal No CP, no sob. No HA.  BP Readings from Last 3 Encounters:  03/22/16 132/70  02/11/16 (!) 142/88  10/06/15 120/80    Basic Metabolic Panel:    Component Value Date/Time   NA 139 03/29/2015 0857   K 3.8 03/29/2015 0857   CL 107 03/29/2015 0857   CO2 25 03/29/2015 0857   BUN 7 03/29/2015 0857   CREATININE 0.86 03/29/2015 0857   GLUCOSE 153 (H) 03/29/2015 0857   CALCIUM 9.0 03/29/2015 0857     Past Medical History, Surgical History, Social History, Family History, Problem List, Medications, and Allergies have been reviewed and updated if relevant.  Patient Active Problem List   Diagnosis Date Noted  . Diabetes mellitus type 2 in obese (HCC) 04/14/2015  . Fatigue 05/05/2009  . Essential  hypertension 12/31/2008  . ALLERGIC RHINITIS 12/31/2008    Past Medical History:  Diagnosis Date  . Allergy   . Diabetes mellitus type 2 in obese (HCC) 04/14/2015  . Hypertension   . Hypothyroid   . Positive PPD, treated    9 months of treatment    Past Surgical History:  Procedure Laterality Date  . CHOLECYSTECTOMY  1999  . TONSILLECTOMY  1995    Social History   Social History  . Marital status: Married    Spouse name: N/A  . Number of children: N/A  . Years of education: N/A   Occupational History  . Clerical Accounting Tech [employer]   Social History Main Topics  . Smoking status: Never Smoker  . Smokeless tobacco: Never Used  . Alcohol use No  . Drug use: No  . Sexual activity: Not Currently    Partners: Male    Birth control/ protection: OCP, Other-see comments     Comment: husband had vasectomy   Other Topics Concern  . Not on file   Social History Narrative  . No narrative on file    Family History  Problem Relation Age of Onset  . Stroke Other   . Diabetes Other   . Cancer Mother   . Heart disease Mother   . Alcohol abuse Neg Hx   . Mental illness Neg Hx     Allergies  Allergen Reactions  . Clarithromycin     REACTION: Extreme nausea  . Diclofenac Nausea Only  . Penicillins     REACTION: rash  . Ace Inhibitors Cough    Medication list reviewed and updated in full in [facility].   GEN: No acute illnesses, no fevers, chills. GI: No n/v/d, eating normally Pulm: No SOB Interactive and getting along well at home.  Otherwise, ROS is as per the HPI.  Objective:   BP 132/70   Pulse (!) 104   Temp 99.2 F (37.3 C) (Oral)   Ht 6\' 1"  (1.854 m)   Wt 258 lb 8 oz (117.3 kg)   LMP 03/04/2016   BMI 34.10 kg/m   GEN: WDWN, NAD, Non-toxic, A & O x 3 HEENT: Atraumatic, Normocephalic. Neck supple. No masses, No LAD. Ears and Nose: No external deformity. CV: RRR, No M/G/R. No JVD. No thrill.  No extra heart sounds. PULM: CTA B, no wheezes, crackles, rhonchi. No retractions. No resp. distress. No accessory muscle use. EXTR: No c/c/e NEURO Normal gait.  PSYCH: Normally interactive. Conversant. Not depressed or anxious appearing.  Calm demeanor.   Laboratory and Imaging Data: Results for orders placed or performed in visit on 03/22/16  Hemoglobin A1c  Result Value Ref Range   Hgb A1c MFr Bld 6.2 4.6 - 6.5 %     Assessment and Plan:   Diabetes mellitus type 2 in obese (HCC) - Plan: Hemoglobin A1c  Need for prophylactic vaccination with combined diphtheria-tetanus-pertussis (DTP) vaccine - Plan: Tdap vaccine greater than or equal to 7yo IM  Essential hypertension  DM is stable, keep up diet changes - tol metformin  BP doing better  Work on weight loss  Follow-up: Return in about 6 months (around 09/19/2016) for Complete physical.  Meds ordered this encounter  Medications  . losartan (COZAAR) 50 MG tablet    Sig: Take 1 tablet (50 mg total) by mouth daily.    Dispense:  90 tablet    Refill:  3  . metFORMIN (GLUCOPHAGE-XR) 500 MG 24 hr tablet    Sig: TAKE 1 TABLET(500 MG) BY MOUTH DAILY WITH BREAKFAST    Dispense:  90 tablet    Refill:  3   Medications Discontinued During This Encounter  Medication Reason  . losartan (COZAAR) 50 MG tablet Reorder  . metFORMIN (GLUCOPHAGE-XR) 500 MG 24 hr tablet Reorder   Orders Placed This Encounter  Procedures  . Tdap vaccine greater than or equal to 7yo IM  . Hemoglobin A1c    Signed,  Steve Youngberg T. Marliss Buttacavoli, MD   Allergies as of 03/22/2016      Reactions   Clarithromycin    REACTION: Extreme nausea   Diclofenac Nausea Only   Penicillins    REACTION: rash   Ace Inhibitors Cough      Medication List       Accurate as of 03/22/16 11:59 PM. Always use your most recent med list.          fluticasone 50 MCG/ACT nasal spray Commonly known as:  FLONASE INSTILL 2 SPRAYS INTO EACH NOSTRIL ONCE DAILY   losartan 50 MG  tablet Commonly known as:  COZAAR Take 1 tablet (50 mg total) by mouth daily.   metFORMIN 500 MG 24 hr tablet Commonly known as:  GLUCOPHAGE-XR TAKE 1 TABLET(500 MG) BY MOUTH DAILY WITH BREAKFAST   multivitamin tablet Take 1 tablet by mouth daily.   ondansetron 8 MG tablet Commonly known as:  ZOFRAN Take 1 tablet (  8 mg total) by mouth every 8 (eight) hours as needed for nausea or vomiting.

## 2016-03-22 NOTE — Progress Notes (Signed)
Pre visit review using our clinic review tool, if applicable. No additional management support is needed unless otherwise documented below in the visit note. 

## 2016-05-19 ENCOUNTER — Other Ambulatory Visit: Payer: Self-pay | Admitting: Obstetrics and Gynecology

## 2016-05-19 DIAGNOSIS — Z1231 Encounter for screening mammogram for malignant neoplasm of breast: Secondary | ICD-10-CM

## 2016-05-31 ENCOUNTER — Encounter: Payer: Self-pay | Admitting: Family Medicine

## 2016-05-31 ENCOUNTER — Ambulatory Visit (INDEPENDENT_AMBULATORY_CARE_PROVIDER_SITE_OTHER): Payer: 59 | Admitting: Family Medicine

## 2016-05-31 VITALS — BP 130/80 | HR 85 | Temp 98.8°F | Ht 73.0 in | Wt 262.5 lb

## 2016-05-31 DIAGNOSIS — J069 Acute upper respiratory infection, unspecified: Secondary | ICD-10-CM

## 2016-05-31 DIAGNOSIS — H60312 Diffuse otitis externa, left ear: Secondary | ICD-10-CM | POA: Diagnosis not present

## 2016-05-31 MED ORDER — NEOMYCIN-POLYMYXIN-HC 3.5-10000-1 OT SOLN: 3.0000 [drp] | Freq: Four times a day (QID) | OTIC | 0 refills | Status: DC

## 2016-05-31 MED ORDER — FLUTICASONE PROPIONATE 50 MCG/ACT NA SUSP: 2.0000 | Freq: Every day | NASAL | 3 refills | Status: DC

## 2016-05-31 NOTE — Progress Notes (Signed)
Dr. Karleen Hampshire T. Azaiah Mello, MD, CAQ Sports Medicine Primary Care and Sports Medicine [address] [place], [address] Phone: [phone] Fax: [phone]  05/31/2016  Patient: Carrie Ellis, MRN: [id number], DOB: 06/27/1965, 51 y.o.  Primary Physician:  Hannah Beat, MD   Chief Complaint  Patient presents with  . Ear Pain    Painful & Itchy  . Watery Eyes  . Cough  . Nasal Congestion   Subjective:   Carrie Ellis is a 51 y.o. very pleasant female patient who presents with the following:  URI sx and L ear is hurting her a lot and itchy.  Last for 5 days the patient has had some runny nose, postnasal drainage, mild sore throat and occasional cough.  She also has developed some left-sided ear pain.  Past Medical History, Surgical History, Social History, Family History, Problem List, Medications, and Allergies have been reviewed and updated if relevant.  Patient Active Problem List   Diagnosis Date Noted  . Diabetes mellitus type 2 in obese (HCC) 04/14/2015  . Fatigue 05/05/2009  . Essential hypertension 12/31/2008  . ALLERGIC RHINITIS 12/31/2008    Past Medical History:  Diagnosis Date  . Allergy   . Diabetes mellitus type 2 in obese (HCC) 04/14/2015  . Hypertension   . Hypothyroid   . Positive PPD, treated    9 months of treatment    Past Surgical History:  Procedure Laterality Date  . CHOLECYSTECTOMY  1999  . TONSILLECTOMY  1995    Social History   Social History  . Marital status: Married    Spouse name: N/A  . Number of children: N/A  . Years of education: N/A   Occupational History  . Clerical Accounting Tech [employer]   Social History Main Topics  . Smoking status: Never Smoker  . Smokeless tobacco: Never Used  . Alcohol use No  . Drug use: No  . Sexual activity: Not Currently    Partners: Male    Birth control/ protection: OCP, Other-see comments     Comment: husband had  vasectomy   Other Topics Concern  . Not on file   Social History Narrative  . No narrative on file    Family History  Problem Relation Age of Onset  . Stroke Other   . Diabetes Other   . Cancer Mother   . Heart disease Mother   . Alcohol abuse Neg Hx   . Mental illness Neg Hx     Allergies  Allergen Reactions  . Clarithromycin     REACTION: Extreme nausea  . Diclofenac Nausea Only  . Penicillins     REACTION: rash  . Ace Inhibitors Cough    Medication list reviewed and updated in full in [place] [facility].  ROS: GEN: Acute illness details above GI: Tolerating PO intake GU: maintaining adequate hydration and urination Pulm: No SOB Interactive and getting along well at home.  Otherwise, ROS is as per the HPI.  Objective:   BP 130/80   Pulse 85   Temp 98.8 F (37.1 C) (Oral)   Ht  (1.854 m)   Wt 262 lb 8 oz (119.1 kg)   LMP 05/13/2016   BMI 34.63 kg/m    Gen: WDWN, NAD; A & O x3, cooperative. Pleasant.Globally Non-toxic HEENT: Normocephalic and atraumatic. Throat clear, w/o exudate, R TM clear, L TM - good landmarks, No fluid present. The tragus and pinna are painful to move. rhinnorhea.  MMM Frontal sinuses: NT Max sinuses: NT NECK: Anterior cervical  LAD is absent CV: RRR, No M/G/R, cap refill <2 sec PULM: Breathing comfortably in no respiratory distress. no wheezing, crackles, rhonchi EXT: No c/c/e PSYCH: Friendly, good eye contact MSK: Nml gait     Laboratory and Imaging Data:  Assessment and Plan:   URI, acute  Acute diffuse otitis externa of left ear  URI and exam c/w OE.  Follow-up: No Follow-up on file.  Meds ordered this encounter  Medications  . fluticasone (FLONASE) 50 MCG/ACT nasal spray    Sig: Place 2 sprays into both nostrils daily.    Dispense:  48 g    Refill:  3    **Patient requests 90 days supply**  . neomycin-polymyxin-hydrocortisone (CORTISPORIN) otic solution    Sig: Place 3 drops into the left ear 4 (four)  times daily.    Dispense:  10 mL    Refill:  0   Medications Discontinued During This Encounter  Medication Reason  . fluticasone (FLONASE) 50 MCG/ACT nasal spray Reorder   No orders of the defined types were placed in this encounter.   Signed,  Elpidio Galea. Zamauri Nez, MD   Allergies as of 05/31/2016      Reactions   Penicillins    REACTION: rash   Ace Inhibitors Cough   Clarithromycin Nausea Only   REACTION: Extreme nausea   Diclofenac Nausea Only      Medication List       Accurate as of 05/31/16 11:59 PM. Always use your most recent med list.          fluticasone 50 MCG/ACT nasal spray Commonly known as:  FLONASE Place 2 sprays into both nostrils daily.   losartan 50 MG tablet Commonly known as:  COZAAR Take 1 tablet (50 mg total) by mouth daily.   metFORMIN 500 MG 24 hr tablet Commonly known as:  GLUCOPHAGE-XR TAKE 1 TABLET(500 MG) BY MOUTH DAILY WITH BREAKFAST   multivitamin tablet Take 1 tablet by mouth daily.   neomycin-polymyxin-hydrocortisone otic solution Commonly known as:  CORTISPORIN Place 3 drops into the left ear 4 (four) times daily.   ondansetron 8 MG tablet Commonly known as:  ZOFRAN Take 1 tablet (8 mg total) by mouth every 8 (eight) hours as needed for nausea or vomiting.

## 2016-05-31 NOTE — Progress Notes (Signed)
Pre visit review using our clinic review tool, if applicable. No additional management support is needed unless otherwise documented below in the visit note. 

## 2016-06-19 ENCOUNTER — Ambulatory Visit
Admission: RE | Admit: 2016-06-19 | Discharge: 2016-06-19 | Disposition: A | Discharge status: Home / Self Care | Payer: 59 | Source: Ambulatory Visit | Attending: Obstetrics and Gynecology | Admitting: Obstetrics and Gynecology

## 2016-06-19 DIAGNOSIS — Z1231 Encounter for screening mammogram for malignant neoplasm of breast: Secondary | ICD-10-CM | POA: Diagnosis not present

## 2016-08-14 ENCOUNTER — Ambulatory Visit (HOSPITAL_COMMUNITY)
Admission: EM | Admit: 2016-08-14 | Discharge: 2016-08-14 | Disposition: A | Discharge status: Home / Self Care | Payer: 59

## 2016-08-14 ENCOUNTER — Encounter (HOSPITAL_COMMUNITY): Payer: Self-pay | Admitting: Emergency Medicine

## 2016-08-14 DIAGNOSIS — I10 Essential (primary) hypertension: Secondary | ICD-10-CM

## 2016-08-14 DIAGNOSIS — R519 Headache, unspecified: Secondary | ICD-10-CM

## 2016-08-14 DIAGNOSIS — R110 Nausea: Secondary | ICD-10-CM | POA: Diagnosis not present

## 2016-08-14 DIAGNOSIS — R51 Headache: Secondary | ICD-10-CM | POA: Diagnosis not present

## 2016-08-14 DIAGNOSIS — G4489 Other headache syndrome: Secondary | ICD-10-CM

## 2016-08-14 NOTE — Discharge Instructions (Signed)
Due to the risk factors of being at least 51 years old or over, sudden headache that is the worst headache of h your life, risk factors of type 2 diabetes mellitus and high blood pressure is recommended that you go to the emergency department now for evaluation of possible serious causes of your headache. Possible causes could be bleeding in the brain, an abnormal mass, swelling of the brain, pre-stroke syndrome, sinusitis or even tension. The worst case scenario would be stroke, death or paralysis.

## 2016-08-14 NOTE — ED Provider Notes (Signed)
CSN: [id number]     Arrival date & time 08/14/16  1750 History   First MD Initiated Contact with Patient 08/14/16 1911     Chief Complaint  Patient presents with  . Headache  . Emesis   (Consider location/radiation/quality/duration/timing/severity/associated sxs/prior Treatment) 51 year old female complaining of severe headache, unrelenting for 4 days. She states is the worst headache of her life. She states is constant and throbbing. It starts from the supraorbital ridge and includes the entire vertex parietal and occipital lobes. She also complains of pain in the periorbital and paranasal sinus areas. She has tried over-the-counter allergy medications and acetaminophen without relief. Denies problems with vision, speech, hearing, swallowing, focal paresthesias or weakness. Her level of energy has decreased. She has not been able to sleep well due to the throbbing headache. Nothing has made her pain better. Sounds, sometimes movement exacerbates the pain. She does not have a history of headaches. No history of migraines. She does have a history of diabetes type 2 and hypertension.      Past Medical History:  Diagnosis Date  . Allergy   . Diabetes mellitus type 2 in obese (HCC) 04/14/2015  . Hypertension   . Hypothyroid   . Positive PPD, treated    9 months of treatment   Past Surgical History:  Procedure Laterality Date  . CHOLECYSTECTOMY  1999  . TONSILLECTOMY  1995   Family History  Problem Relation Age of Onset  . Stroke Other   . Diabetes Other   . Cancer Mother   . Heart disease Mother   . Alcohol abuse Neg Hx   . Mental illness Neg Hx    Social History  Substance Use Topics  . Smoking status: Never Smoker  . Smokeless tobacco: Never Used  . Alcohol use No   OB History    Gravida Para Term Preterm AB Living   2 2 2     2    SAB TAB Ectopic Multiple Live Births           2     Review of Systems  Constitutional: Positive for activity change. Negative for chills,  diaphoresis and fever.  HENT: Positive for sinus pain. Negative for ear pain, hearing loss, nosebleeds, postnasal drip, sore throat, tinnitus and trouble swallowing.   Eyes: Negative for photophobia and redness.       See also HPI  Respiratory: Negative for cough and shortness of breath.   Cardiovascular: Negative for chest pain, palpitations and leg swelling.  Gastrointestinal: Positive for nausea. Negative for abdominal pain, blood in stool and vomiting.  Genitourinary: Negative.   Skin: Negative for rash.  Neurological: Positive for weakness and headaches. Negative for tremors, seizures, syncope, facial asymmetry, speech difficulty and numbness.       See also HPI  Psychiatric/Behavioral: Negative.   All other systems reviewed and are negative.   Allergies  Penicillins; Ace inhibitors; Clarithromycin; and Diclofenac  Home Medications   Prior to Admission medications   Medication Sig Start Date End Date Taking? Authorizing Provider  losartan (COZAAR) 50 MG tablet Take 1 tablet (50 mg total) by mouth daily. 03/22/16  Yes Copland, Karleen Hampshire, MD  metFORMIN (GLUCOPHAGE-XR) 500 MG 24 hr tablet TAKE 1 TABLET(500 MG) BY MOUTH DAILY WITH BREAKFAST 03/22/16  Yes Copland, Spencer, MD  Multiple Vitamin (MULTIVITAMIN) tablet Take 1 tablet by mouth daily.     Yes [provider]   Meds Ordered and Administered this Visit  Medications - No data to display  BP Marland Kitchen)  179/97 (BP Location: Right Arm)   Pulse 90   Temp 98.2 F (36.8 C) (Oral)   Resp 18   LMP 07/29/2016   SpO2 100%  No data found.   Physical Exam  Constitutional: She is oriented to person, place, and time. She appears well-developed and well-nourished. No distress.  HENT:  Head: Normocephalic and atraumatic.  Right Ear: External ear normal.  Left Ear: External ear normal.  Nose: Nose normal.  Mouth/Throat: Oropharynx is clear and moist. No oropharyngeal exudate.  Bilateral TMs are normal. Oropharynx: Minor erythema and  scant clear PND, sore throat is a symmetrically. Tongue in the midline. Speech is clear.  Eyes: Conjunctivae and EOM are normal. Pupils are equal, round, and reactive to light. Right eye exhibits no discharge. Left eye exhibits no discharge.  Neck: Normal range of motion. Neck supple.  Cardiovascular: Normal rate, regular rhythm, normal heart sounds and intact distal pulses.   Pulmonary/Chest: Effort normal and breath sounds normal. No respiratory distress. She has no wheezes.  Abdominal: Soft. There is no tenderness.  Musculoskeletal: Normal range of motion. She exhibits no edema or tenderness.  Lymphadenopathy:    She has no cervical adenopathy.  Neurological: She is alert and oriented to person, place, and time. She has normal strength. She displays no tremor. No cranial nerve deficit or sensory deficit. She exhibits normal muscle tone. Coordination and gait normal. GCS eye subscore is 4. GCS verbal subscore is 5. GCS motor subscore is 6.  Skin: Skin is warm and dry. No rash noted.  Psychiatric: She has a normal mood and affect.  Nursing note and vitals reviewed.   Urgent Care Course     Procedures (including critical care time)  Labs Review Labs Reviewed - No data to display  Imaging Review No results found.   Visual Acuity Review  Right Eye Distance:   Left Eye Distance:   Bilateral Distance:    Right Eye Near:   Left Eye Near:    Bilateral Near:         MDM   1. Other headache syndrome   2. Nausea without vomiting   3. Bad headache   4. Essential hypertension    Due to the risk factors of being at least 51 years old or over, sudden headache that is the worst headache of h your life, risk factors of type 2 diabetes mellitus and high blood pressure is recommended that you go to the emergency department now for evaluation of possible serious causes of your headache. Possible causes could be bleeding in the brain, an abnormal mass, swelling of the brain, pre-stroke  syndrome, sinusitis or even tension. The worst case scenario would be stroke, death or paralysis.     Hayden RasmussenMabe, Valin Massie, NP 08/14/16 1950

## 2016-08-14 NOTE — ED Triage Notes (Signed)
[facility] a [facility] a headache and vomiting x 4 days. The patient denied any hx of migraines.

## 2016-08-15 ENCOUNTER — Telehealth: Payer: Self-pay | Admitting: Family Medicine

## 2016-08-15 NOTE — Telephone Encounter (Addendum)
Pt has had h/a like this before; no dizziness or vision changes.I offered pt appt at another [facility] site today but it was too far to travel. Pt scheduled appt with Dr Reece AgarG on 08/17/16 at 3 pm.

## 2016-08-15 NOTE — Telephone Encounter (Signed)
Patient Name: Carrie Ellis  DOB: 07/08/1965    Initial Comment headache last few days   Nurse Assessment  Nurse: Scarlette ArStandifer, RN, Heather Date/Time (Eastern Time): 08/15/2016 8:21:01 AM  Confirm and document reason for call. If symptomatic, describe symptoms. ---Caller states that she has had a headache since Friday  Does the patient have any new or worsening symptoms? ---Yes  Will a triage be completed? ---Yes  Related visit to physician within the last 2 weeks? ---No  Does the PT have any chronic conditions? (i.e. diabetes, asthma, etc.) ---Yes  List chronic conditions. ---See MR  Is the patient pregnant or possibly pregnant? (Ask all females between the ages of 5812-55) ---No  Is this a behavioral health or substance abuse call? ---No     Guidelines    Guideline Title Affirmed Question Affirmed Notes  Headache [1] MODERATE headache (e.g., interferes with normal activities) AND [2] present > 24 hours AND [3] unexplained (Exceptions: analgesics not tried, typical migraine, or headache part of viral illness)    Final Disposition User   See Physician within 24 Hours Standifer, RN, Insurance underwriterHeather    Comments  [facility] anything [facility]. she would like to be worked in to the office today if possible. Please call her and let her know.   Referrals  REFERRED TO PCP OFFICE   Disagree/Comply: Comply

## 2016-08-17 ENCOUNTER — Ambulatory Visit: Payer: 59 | Admitting: Family Medicine

## 2016-09-18 ENCOUNTER — Other Ambulatory Visit: Payer: Self-pay | Admitting: Family Medicine

## 2016-09-18 ENCOUNTER — Other Ambulatory Visit (INDEPENDENT_AMBULATORY_CARE_PROVIDER_SITE_OTHER): Payer: 59

## 2016-09-18 DIAGNOSIS — Z0000 Encounter for general adult medical examination without abnormal findings: Secondary | ICD-10-CM | POA: Diagnosis not present

## 2016-09-18 DIAGNOSIS — E119 Type 2 diabetes mellitus without complications: Secondary | ICD-10-CM

## 2016-09-18 LAB — CBC WITH DIFFERENTIAL/PLATELET
BASOS ABS: 52 {cells}/uL (ref 0–200)
Basophils Relative: 1 %
EOS ABS: 156 {cells}/uL (ref 15–500)
Eosinophils Relative: 3 %
HCT: 39.6 % (ref 35.0–45.0)
HEMOGLOBIN: 12.4 g/dL (ref 11.7–15.5)
LYMPHS ABS: 2548 {cells}/uL (ref 850–3900)
Lymphocytes Relative: 49 %
MCH: 24 pg — AB (ref 27.0–33.0)
MCHC: 31.3 g/dL — ABNORMAL LOW (ref 32.0–36.0)
MCV: 76.7 fL — AB (ref 80.0–100.0)
MPV: 10.6 fL (ref 7.5–12.5)
Monocytes Absolute: 364 cells/uL (ref 200–950)
Monocytes Relative: 7 %
NEUTROS ABS: 2080 {cells}/uL (ref 1500–7800)
Neutrophils Relative %: 40 %
Platelets: 319 10*3/uL (ref 140–400)
RBC: 5.16 MIL/uL — ABNORMAL HIGH (ref 3.80–5.10)
RDW: 15.6 % — ABNORMAL HIGH (ref 11.0–15.0)
WBC: 5.2 10*3/uL (ref 3.8–10.8)

## 2016-09-18 LAB — TSH: TSH: 0.03 mIU/L — ABNORMAL LOW

## 2016-09-18 NOTE — Addendum Note (Signed)
Addended by: Alvina ChouWALSH, Brittain Hosie J on: 09/18/2016 11:18 AM   Modules accepted: Orders

## 2016-09-19 LAB — HEPATIC FUNCTION PANEL
ALK PHOS: 69 U/L (ref 33–130)
ALT: 11 U/L (ref 6–29)
AST: 13 U/L (ref 10–35)
Albumin: 4.1 g/dL (ref 3.6–5.1)
BILIRUBIN INDIRECT: 0.4 mg/dL (ref 0.2–1.2)
BILIRUBIN TOTAL: 0.5 mg/dL (ref 0.2–1.2)
Bilirubin, Direct: 0.1 mg/dL (ref <=0.2)
TOTAL PROTEIN: 7.6 g/dL (ref 6.1–8.1)

## 2016-09-19 LAB — MICROALBUMIN / CREATININE URINE RATIO
CREATININE, URINE: 219 mg/dL (ref 20–320)
Microalb Creat Ratio: 81 mcg/mg creat — ABNORMAL HIGH (ref <30)
Microalb, Ur: 17.8 mg/dL

## 2016-09-19 LAB — BASIC METABOLIC PANEL
BUN: 8 mg/dL (ref 7–25)
CALCIUM: 9 mg/dL (ref 8.6–10.4)
CO2: 18 mmol/L — AB (ref 20–32)
CREATININE: 0.79 mg/dL (ref 0.50–1.05)
Chloride: 105 mmol/L (ref 98–110)
GLUCOSE: 118 mg/dL — AB (ref 65–99)
Potassium: 3.9 mmol/L (ref 3.5–5.3)
Sodium: 136 mmol/L (ref 135–146)

## 2016-09-19 LAB — T3, FREE: T3 FREE: 3.4 pg/mL (ref 2.3–4.2)

## 2016-09-19 LAB — LIPID PANEL
Cholesterol: 195 mg/dL (ref <200)
HDL: 62 mg/dL (ref >50)
LDL CALC: 119 mg/dL — AB (ref <100)
TRIGLYCERIDES: 68 mg/dL (ref <150)
Total CHOL/HDL Ratio: 3.1 Ratio (ref <5.0)
VLDL: 14 mg/dL (ref <30)

## 2016-09-19 LAB — HEMOGLOBIN A1C
Hgb A1c MFr Bld: 6.3 % — ABNORMAL HIGH (ref <5.7)
Mean Plasma Glucose: 134 mg/dL

## 2016-09-19 LAB — T4, FREE: FREE T4: 1.6 ng/dL (ref 0.8–1.8)

## 2016-09-20 ENCOUNTER — Ambulatory Visit (INDEPENDENT_AMBULATORY_CARE_PROVIDER_SITE_OTHER): Payer: 59 | Admitting: Family Medicine

## 2016-09-20 ENCOUNTER — Encounter: Payer: Self-pay | Admitting: Family Medicine

## 2016-09-20 VITALS — BP 140/66 | HR 110 | Temp 99.0°F | Ht 71.5 in | Wt 257.5 lb

## 2016-09-20 DIAGNOSIS — R946 Abnormal results of thyroid function studies: Secondary | ICD-10-CM | POA: Diagnosis not present

## 2016-09-20 DIAGNOSIS — Z0000 Encounter for general adult medical examination without abnormal findings: Secondary | ICD-10-CM

## 2016-09-20 DIAGNOSIS — E0590 Thyrotoxicosis, unspecified without thyrotoxic crisis or storm: Secondary | ICD-10-CM | POA: Diagnosis not present

## 2016-09-20 DIAGNOSIS — R7989 Other specified abnormal findings of blood chemistry: Secondary | ICD-10-CM

## 2016-09-20 MED ORDER — SCOPOLAMINE 1 MG/3DAYS TD PT72: 1.0000 | MEDICATED_PATCH | TRANSDERMAL | 3 refills | Status: DC

## 2016-09-20 MED ORDER — ATORVASTATIN CALCIUM 20 MG PO TABS: 20.0000 mg | ORAL_TABLET | Freq: Every day | ORAL | 3 refills | Status: DC

## 2016-09-20 NOTE — Progress Notes (Signed)
Dr. Frederico Hamman T. Pavneet Markwood, MD, [place] [facility] Primary Care and Sports Medicine [place], [address] Phone: [phone] Fax: [phone]  09/20/2016  Patient: Carrie Ellis, MRN: [id number], DOB: Jan 08, 1966, 51 y.o.  Primary Physician:  Owens Loffler, MD   Chief Complaint  Patient presents with  . Annual Exam   Subjective:   Carrie Ellis is a 51 y.o. pleasant patient who presents with the following:  Health Maintenance Summary Reviewed and updated, unless pt declines services.  Tobacco History Reviewed. Non-smoker Alcohol: No concerns, no excessive use Exercise Habits: Some activity, rec at least 30 mins 5 times a week STD concerns: none Drug Use: None Birth control method: vas Menses regular: yes Lumps or breast concerns: no Breast Cancer Family History: no  Health Maintenance  Topic Date Due  . FOOT EXAM  08/31/1975  . HIV Screening  08/30/1980  . COLONOSCOPY  08/31/2015  . INFLUENZA VACCINE  09/13/2016  . OPHTHALMOLOGY EXAM  01/04/2017  . HEMOGLOBIN A1C  03/21/2017  . PAP SMEAR  02/09/2018  . MAMMOGRAM  06/20/2018  . PNEUMOCOCCAL POLYSACCHARIDE VACCINE (2) 10/05/2020  . TETANUS/TDAP  03/22/2026    Immunization History  Administered Date(s) Administered  . Influenza Split 11/07/2011  . Influenza,inj,Quad PF,36+ Mos 11/10/2013, 11/20/2014, 10/06/2015  . Pneumococcal Polysaccharide-23 10/06/2015  . Td 02/14/2004  . Tdap 03/22/2016   Patient Active Problem List   Diagnosis Date Noted  . Diabetes mellitus type 2 in obese ([place]) 04/14/2015  . Fatigue 05/05/2009  . Essential hypertension 12/31/2008  . ALLERGIC RHINITIS 12/31/2008   Past Medical History:  Diagnosis Date  . Allergy   . Diabetes mellitus type 2 in obese ([place]) 04/14/2015  . Hypertension   . Hypothyroid   . Positive PPD, treated    9 months of treatment   Past Surgical History:  Procedure Laterality Date  . CHOLECYSTECTOMY  1999  . TONSILLECTOMY   1995   Social History   Social History  . Marital status: Married    Spouse name: N/A  . Number of children: N/A  . Years of education: N/A   Occupational History  . [employer]   Social History Main Topics  . Smoking status: Never Smoker  . Smokeless tobacco: Never Used  . Alcohol use No  . Drug use: No  . Sexual activity: Not Currently    Partners: Male    Birth control/ protection: OCP, Other-see comments     Comment: husband had vasectomy   Other Topics Concern  . Not on file   Social History Narrative  . No narrative on file   Family History  Problem Relation Age of Onset  . Stroke Other   . Diabetes Other   . Cancer Mother   . Heart disease Mother   . Alcohol abuse Neg Hx   . Mental illness Neg Hx    Allergies  Allergen Reactions  . Penicillins     REACTION: rash  . Ace Inhibitors Cough  . Clarithromycin Nausea Only    REACTION: Extreme nausea  . Diclofenac Nausea Only    Medication list has been reviewed and updated.   General: Denies fever, chills, sweats. No significant weight loss. Eyes: Denies blurring,significant itching ENT: Denies earache, sore throat, and hoarseness.  Cardiovascular: Denies chest pains, palpitations, dyspnea on exertion,  Respiratory: Denies cough, dyspnea at rest,wheeezing Breast: no concerns about lumps GI: Denies nausea, vomiting, diarrhea, constipation, change in  bowel habits, abdominal pain, melena, hematochezia GU: Denies dysuria, hematuria, urinary hesitancy, nocturia, denies STD risk, no concerns about discharge Musculoskeletal: Denies back pain, joint pain Derm: Denies rash, itching Neuro: Denies  paresthesias, frequent falls, frequent headaches Psych: Denies depression, anxiety Endocrine: Denies cold intolerance, heat intolerance, polydipsia Heme: Denies enlarged lymph nodes Allergy: No hayfever  Objective:   BP 140/66   Pulse (!) 110   Temp  99 F (37.2 C) (Oral)   Ht 5' 11.5" (1.816 m)   Wt 257 lb 8 oz (116.8 kg)   LMP 08/25/2016   BMI 35.41 kg/m  No exam data present  GEN: well developed, well nourished, no acute distress Eyes: conjunctiva and lids normal, PERRLA, EOMI ENT: TM clear, nares clear, oral exam WNL Neck: supple, no lymphadenopathy, no thyromegaly, no JVD Pulm: clear to auscultation and percussion, respiratory effort normal CV: regular rate and rhythm, S1-S2, no murmur, rub or gallop, no bruits Chest: no scars, masses, no lumps BREAST: breast exam declined GI: soft, non-tender; no hepatosplenomegaly, masses; active bowel sounds all quadrants GU: GU exam declined Lymph: no cervical, axillary or inguinal adenopathy MSK: gait normal, muscle tone and strength WNL, no joint swelling, effusions, discoloration, crepitus  SKIN: clear, good turgor, color WNL, no rashes, lesions, or ulcerations Neuro: normal mental status, normal strength, sensation, and motion Psych: alert; oriented to person, place and time, normally interactive and not anxious or depressed in appearance.   All labs reviewed with patient. Lipids:    Component Value Date/Time   CHOL 195 09/18/2016 1117   TRIG 68 09/18/2016 1117   HDL 62 09/18/2016 1117   LDLDIRECT 145.7 05/05/2009 1509   VLDL 14 09/18/2016 1117   CHOLHDL 3.1 09/18/2016 1117   CBC: CBC Latest Ref Rng & Units 09/18/2016 03/29/2015 10/23/2011  WBC 3.8 - 10.8 K/uL 5.2 5.5 7.7  Hemoglobin 11.7 - 15.5 g/dL 12.4 13.2 13.1  Hematocrit 35.0 - 45.0 % 39.6 41.2 41.3  Platelets 140 - 400 K/uL 319 299.0 384.5    Basic Metabolic Panel:    Component Value Date/Time   NA 136 09/18/2016 1117   K 3.9 09/18/2016 1117   CL 105 09/18/2016 1117   CO2 18 (L) 09/18/2016 1117   BUN 8 09/18/2016 1117   CREATININE 0.79 09/18/2016 1117   GLUCOSE 118 (H) 09/18/2016 1117   CALCIUM 9.0 09/18/2016 1117   Hepatic Function Latest Ref Rng & Units 09/18/2016 03/29/2015 10/23/2011  Total Protein 6.1 - 8.1  g/dL 7.6 7.4 7.5  Albumin 3.6 - 5.1 g/dL 4.1 4.0 3.7  AST 10 - 35 U/L 13 13 14   ALT 6 - 29 U/L 11 11 17   Alk Phosphatase 33 - 130 U/L 69 91 65  Total Bilirubin 0.2 - 1.2 mg/dL 0.5 0.4 0.5  Bilirubin, Direct <=0.2 mg/dL 0.1 0.1 0.1    Lab Results  Component Value Date   TSH 0.03 (L) 09/18/2016   No results found.  Assessment and Plan:   Healthcare maintenance  Hyperthyroidism - Plan: Ambulatory referral to Endocrinology  Elevated TSH - Plan: Ambulatory referral to Endocrinology  Persistent << TSH, normal t4 and t3. Dm stable.  BP stable.  Declines colonoscopy - think about cologuard.  Health Maintenance Exam: The patient's preventative maintenance and recommended screening tests for an annual wellness exam were reviewed in full today. Brought up to date unless services declined.  Counselled on the importance of diet, exercise, and its role in overall health and mortality. The patient's FH and SH was reviewed,  including their home life, tobacco status, and drug and alcohol status.  Follow-up in 1 year for physical exam or additional follow-up below.  Follow-up: No Follow-up on file. Or follow-up in 1 year if not noted.  Future Appointments Date Time Provider [place]  03/26/2017 8:00 AM Saraphina Lauderbaugh, Frederico Hamman, MD [facility]    Meds ordered this encounter  Medications  . scopolamine (TRANSDERM-SCOP, 1.5 MG,) 1 MG/3DAYS    Sig: Place 1 patch (1.5 mg total) onto the skin every 3 (three) days.    Dispense:  10 patch    Refill:  3  . atorvastatin (LIPITOR) 20 MG tablet    Sig: Take 1 tablet (20 mg total) by mouth daily.    Dispense:  90 tablet    Refill:  3   There are no discontinued medications. Orders Placed This Encounter  Procedures  . Ambulatory referral to Endocrinology    Signed,  Maud Deed. Aishwarya Shiplett, MD   Allergies as of 09/20/2016      Reactions   Penicillins    REACTION: rash   Ace Inhibitors Cough   Clarithromycin Nausea Only    REACTION: Extreme nausea   Diclofenac Nausea Only      Medication List       Accurate as of 09/20/16  9:42 AM. Always use your most recent med list.          atorvastatin 20 MG tablet Commonly known as:  LIPITOR Take 1 tablet (20 mg total) by mouth daily.   losartan 50 MG tablet Commonly known as:  COZAAR Take 1 tablet (50 mg total) by mouth daily.   metFORMIN 500 MG 24 hr tablet Commonly known as:  GLUCOPHAGE-XR TAKE 1 TABLET(500 MG) BY MOUTH DAILY WITH BREAKFAST   multivitamin tablet Take 1 tablet by mouth daily.   scopolamine 1 MG/3DAYS Commonly known as:  TRANSDERM-SCOP (1.5 MG) Place 1 patch (1.5 mg total) onto the skin every 3 (three) days.

## 2016-09-20 NOTE — Patient Instructions (Signed)

## 2016-11-09 ENCOUNTER — Ambulatory Visit (INDEPENDENT_AMBULATORY_CARE_PROVIDER_SITE_OTHER): Payer: 59 | Admitting: Internal Medicine

## 2016-11-09 ENCOUNTER — Encounter: Payer: Self-pay | Admitting: Internal Medicine

## 2016-11-09 VITALS — BP 134/84 | HR 103 | Ht 70.0 in | Wt 263.0 lb

## 2016-11-09 DIAGNOSIS — E042 Nontoxic multinodular goiter: Secondary | ICD-10-CM

## 2016-11-09 DIAGNOSIS — E0590 Thyrotoxicosis, unspecified without thyrotoxic crisis or storm: Secondary | ICD-10-CM

## 2016-11-09 LAB — T4, FREE: FREE T4: 1 ng/dL (ref 0.60–1.60)

## 2016-11-09 LAB — TSH: TSH: 0.03 u[IU]/mL — AB (ref 0.35–4.50)

## 2016-11-09 LAB — T3, FREE: T3, Free: 3.4 pg/mL (ref 2.3–4.2)

## 2016-11-09 NOTE — Patient Instructions (Addendum)
Please stop at the lab.  Please come back for a follow-up appointment in 4 months.   Hyperthyroidism Hyperthyroidism is when the thyroid is too active (overactive). Your thyroid is a large gland that is located in your neck. The thyroid helps to control how your body uses food (metabolism). When your thyroid is overactive, it produces too much of a hormone called thyroxine. What are the causes? Causes of hyperthyroidism may include:  Graves disease. This is when your immune system attacks the thyroid gland. This is the most common cause.  Inflammation of the thyroid gland.  Tumor in the thyroid gland or somewhere else.  Excessive use of thyroid medicines, including: ? Prescription thyroid supplement. ? Herbal supplements that mimic thyroid hormones.  Solid or fluid-filled lumps within your thyroid gland (thyroid nodules).  Excessive ingestion of iodine.  What increases the risk?  Being female.  Having a family history of thyroid conditions. What are the signs or symptoms? Signs and symptoms of hyperthyroidism may include:  Nervousness.  Inability to tolerate heat.  Unexplained weight loss.  Diarrhea.  Change in the texture of hair or skin.  Heart skipping beats or making extra beats.  Rapid heart rate.  Loss of menstruation.  Shaky hands.  Fatigue.  Restlessness.  Increased appetite.  Sleep problems.  Enlarged thyroid gland or nodules.  How is this diagnosed? Diagnosis of hyperthyroidism may include:  Medical history and physical exam.  Blood tests.  Ultrasound tests.  How is this treated? Treatment may include:  Medicines to control your thyroid.  Surgery to remove your thyroid.  Radiation therapy.  Follow these instructions at home:  Take medicines only as directed by your health care provider.  Do not use any tobacco products, including cigarettes, chewing tobacco, or electronic cigarettes. If you need help quitting, ask your health  care provider.  Do not exercise or do physical activity until your health care provider approves.  Keep all follow-up appointments as directed by your health care provider. This is important. Contact a health care provider if:  Your symptoms do not get better with treatment.  You have fever.  You are taking thyroid replacement medicine and you: ? Have depression. ? Feel mentally and physically slow. ? Have weight gain. Get help right away if:  You have decreased alertness or a change in your awareness.  You have abdominal pain.  You feel dizzy.  You have a rapid heartbeat.  You have an irregular heartbeat. This information is not intended to replace advice given to you by your health care provider. Make sure you discuss any questions you have with your health care provider. Document Released: 01/30/2005 Document Revised: 07/01/2015 Document Reviewed: 06/17/2013 Elsevier Interactive Patient Education  2017 [employer].

## 2016-11-09 NOTE — Progress Notes (Addendum)
Patient ID: Carrie Ellis, female   DOB: 27-Jan-1966, 51 y.o.   MRN: [id number]    HPI  Carrie Ellis is a 51 y.o.-year-old female, referred by her PCP, Dr. Patsy Lager, for evaluation for multiple thyroid nodules and thyrotoxicosis.  Pt has had a low TSH since 1990s >> saw Dr. Margaretmary Bayley >> was on medications >> then TFTs normalized >> stopped the medication (cannot remember name).   She then started to see Dr. Patsy Lager and her TFTs started to become abnormal again in 2017.  I reviewed pt's thyroid tests: Lab Results  Component Value Date   TSH 0.03 (L) 09/18/2016   TSH 0.03 (L) 04/14/2015   TSH 0.07 (L) 03/29/2015   TSH 0.38 10/23/2011   TSH 0.29 (L) 05/05/2009   FREET4 1.6 09/18/2016   FREET4 1.14 04/14/2015   FREET4 1.0 05/05/2009  2011: TSH 0.29, normal free hormones   Thyroid ultrasound (04/20/2006 compared with 05/13/2004) showed multinodular goiter with slight interval increase in size of the right inferior thyroid nodule and left inferior thyroid nodule, but otherwise stable size of the nodules, with slightly enlarged thyroid.  Pt denies feeling nodules in neck, hoarseness, dysphagia/odynophagia, SOB with lying down; she denies: - fatigue - excessive sweating/heat intolerance - tremors - palpitations - hyperdefecation - weight loss - hair loss  She has: - + anxiety (not increased)  Pt does have a FH of thyroid ds - mother (hypothyroidism). No FH of thyroid cancer. No h/o radiation tx to head or neck.  No seaweed or kelp, no recent contrast studies. No steroid use. No herbal supplements. No Biotin use - takes MVI  - not in last week.  Pt. also has a history of HL, DM2, HTN, GERD  ROS: Constitutional: no weight gain/loss, no fatigue, no subjective hyperthermia/hypothermia, + nocturia Eyes: no blurry vision, no xerophthalmia ENT: no sore throat, no nodules palpated in throat, no dysphagia/odynophagia, no hoarseness Cardiovascular: no  CP/SOB/palpitations/leg swelling Respiratory: no cough/SOB Gastrointestinal: no N/V/D/C Musculoskeletal: no muscle/joint aches Skin: no rashes Neurological: no tremors/numbness/tingling/dizziness, + HA with menses Psychiatric: no depression/anxiety  Past Medical History:  Diagnosis Date  . Allergy   . Diabetes mellitus type 2 in obese (HCC) 04/14/2015  . Hypertension   . Hypothyroid   . Positive PPD, treated    9 months of treatment   Past Surgical History:  Procedure Laterality Date  . CHOLECYSTECTOMY  1999  . TONSILLECTOMY  1995   Social History   Social History  . Marital status: Married    Spouse name: N/A  . Number of children: 2   Occupational History  . 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 Smoking status: Never Smoker  . Smokeless tobacco: Never Used  . Alcohol use No  . Drug use: No  . Sexual activity: Not Currently    Partners: Male    Birth control/ protection: OCP, Other-see comments     Comment: husband had vasectomy   Current Outpatient Prescriptions on File Prior to Visit  Medication Sig Dispense Refill  . atorvastatin (LIPITOR) 20 MG tablet Take 1 tablet (20 mg total) by mouth daily. 90 tablet 3  . losartan (COZAAR) 50 MG tablet Take 1 tablet (50 mg total) by mouth daily. 90 tablet 3  . metFORMIN (GLUCOPHAGE-XR) 500 MG 24 hr tablet TAKE 1 TABLET(500 MG) BY MOUTH DAILY WITH BREAKFAST 90 tablet 3  . Multiple Vitamin (MULTIVITAMIN) tablet Take 1 tablet by  mouth daily.      Marland Kitchen scopolamine (TRANSDERM-SCOP, 1.5 MG,) 1 MG/3DAYS Place 1 patch (1.5 mg total) onto the skin every 3 (three) days. (Patient not taking: Reported on 11/09/2016) 10 patch 3   No current facility-administered medications on file prior to visit.    Allergies  Allergen Reactions  . Penicillins     REACTION: rash  . Ace Inhibitors Cough  . Clarithromycin Nausea Only    REACTION: Extreme nausea  . Diclofenac Nausea  Only   Family History  Problem Relation Age of Onset  . Stroke Other   . Diabetes Other   . Cancer Mother   . Heart disease Mother   . Alcohol abuse Neg Hx   . Mental illness Neg Hx    PE: BP 134/84 (BP Location: Left Arm, Patient Position: Sitting)   Pulse (!) 103   Ht  (1.778 m)   Wt 263 lb (119.3 kg)   LMP 11/04/2016   SpO2 98%   BMI 37.74 kg/m  Wt Readings from Last 3 Encounters:  11/09/16 263 lb (119.3 kg)  09/20/16 257 lb 8 oz (116.8 kg)  05/31/16 262 lb 8 oz (119.1 kg)   Constitutional: overweight, in NAD Eyes: PERRLA, EOMI, no exophthalmos, no lid lag, no stare ENT: moist mucous membranes, + mild symmetric thyromegaly, no thyroid bruits, no cervical lymphadenopathy Cardiovascular: tachycardia, RR, No MRG Respiratory: CTA B Gastrointestinal: abdomen soft, NT, ND, BS+ Musculoskeletal: no deformities, strength intact in all 4 Skin: moist, warm, no rashes Neurological: no tremor with outstretched hands, DTR normal in all 4  ASSESSMENT: 1. Subclinical Thyrotoxicosis  2. MNG  PLAN:  1. Patient with a log h/o subclinical thyrotoxicosis low TSH, without thyrotoxic sxs: weight loss, heat intolerance, hyperdefecation, palpitations, anxiety. She has a high HR today, but she mentions it is always so when coming to the doctor. She has a fit bit >> HR not high at home. - she does not appear to have exogenous causes for the low TSH.  - We discussed that possible causes of thyrotoxicosis are:  Graves ds   Thyroiditis toxic multinodular goiter/ toxic adenoma (she had MNG on thyroid U/S) - I suggested that we check the TSH, fT3 and fT4 and also add thyroid stimulating antibodies to screen for Graves' disease.  - If the tests remain abnormal, we may need an uptake and scan to differentiate between the 3 above possible etiologies  - we discussed about possible modalities of treatment for the above conditions, to include methimazole use, radioactive iodine ablation or (last  resort) surgery. - I did not suggest to add beta blockers at this time, but I advised her to check HR at rest and let me know if >90 - RTC in 3 months, but likely sooner for repeat labs  2. MNG - will check a thyroid uptake and scan if TSI titer is not elevated - no neck compression sxs  Component     Latest Ref Rng & Units 11/09/2016  TSH     0.35 - 4.50 uIU/mL 0.03 (L)  Triiodothyronine,Free,Serum     2.3 - 4.2 pg/mL 3.4  T4,Free(Direct)     0.60 - 1.60 ng/dL 1.61  TSI     <096 % baseline <89   Low TSH and normal free thyroid hhs and TSIs >> will check a thyroid Uptake and scan.  NM THYROID SNG UPTAKE W/IMAGING  Order: [id number]  Status:  Final result Visible to patient:  No (Not Released) Dx:  Thyrotoxicosis without  thyroid storm,...  Details   Reading Physician Reading Date Result Priority  Ulyses Southward, MD 11/28/2016   Narrative    CLINICAL DATA: Thyrotoxicosis without thyroid storm, TSH = 0.03  EXAM: THYROID SCAN AND UPTAKE - 24 HOURS  TECHNIQUE: Following the per oral administration of I-131 sodium iodide, the patient returned at 24 hours and uptake measurements were acquired with the uptake probe centered on the neck. Thyroid imaging was performed following the intravenous administration of the Tc-24m Pertechnetate.  RADIOPHARMACEUTICALS: 9.51 MicroCuries I-131 sodium iodide orally and 10 mCi Technetium-12m pertechnetate IV  COMPARISON: None  FINDINGS: 24 hour radio iodine uptake is calculated at 26%, within normal range.  Images of the thyroid gland in 3 projections demonstrate multiple hot nodules at the inferior poles of both thyroid lobes, RIGHT nodule larger than LEFT nodules, with suppression of uptake in remaining tissue.  Findings are compatible with a multinodular thyroid gland a hyper functional BILATERAL thyroid nodules.  IMPRESSION: Hyper functional BILATERAL thyroid nodules with suppression of uptake in remaining thyroid tissue  compatible with hyper functional thyroid adenomas.  24 hour radio iodine uptake of 26%.   Electronically Signed By: Ulyses Southward M.D. On: 11/28/2016 15:24       Toxic bilateral thyroid adenomas >> I would recommend RAI tx >> will d./w pt.  Carlus Pavlov, MD PhD [facility] Endocrinology

## 2016-11-14 LAB — THYROID STIMULATING IMMUNOGLOBULIN

## 2016-11-15 NOTE — Addendum Note (Signed)
Addended by: Carlus Pavlov on: 11/15/2016 12:14 PM   Modules accepted: Orders

## 2016-11-27 ENCOUNTER — Ambulatory Visit (HOSPITAL_COMMUNITY)
Admission: RE | Admit: 2016-11-27 | Discharge: 2016-11-27 | Disposition: A | Discharge status: Home / Self Care | Payer: 59 | Source: Ambulatory Visit | Attending: Internal Medicine | Admitting: Internal Medicine

## 2016-11-27 DIAGNOSIS — E0590 Thyrotoxicosis, unspecified without thyrotoxic crisis or storm: Secondary | ICD-10-CM | POA: Insufficient documentation

## 2016-11-27 DIAGNOSIS — E042 Nontoxic multinodular goiter: Secondary | ICD-10-CM | POA: Insufficient documentation

## 2016-11-27 MED ORDER — SODIUM IODIDE I 131 CAPSULE
9.5100 | Freq: Once | INTRAVENOUS | Status: AC
  Administered 2016-11-27: 9.51 via ORAL

## 2016-11-28 ENCOUNTER — Encounter (HOSPITAL_COMMUNITY)
Admission: RE | Admit: 2016-11-28 | Discharge: 2016-11-28 | Disposition: A | Discharge status: Home / Self Care | Payer: 59 | Source: Ambulatory Visit | Attending: Internal Medicine | Admitting: Internal Medicine

## 2016-11-28 DIAGNOSIS — E0590 Thyrotoxicosis, unspecified without thyrotoxic crisis or storm: Secondary | ICD-10-CM | POA: Diagnosis not present

## 2016-11-28 DIAGNOSIS — E042 Nontoxic multinodular goiter: Secondary | ICD-10-CM | POA: Diagnosis not present

## 2016-11-28 MED ORDER — SODIUM PERTECHNETATE TC 99M INJECTION
10.0000 | Freq: Once | INTRAVENOUS | Status: AC | PRN
  Administered 2016-11-28: 10 via INTRAVENOUS

## 2016-12-21 ENCOUNTER — Encounter: Payer: Self-pay | Admitting: Radiology

## 2017-03-07 ENCOUNTER — Encounter: Payer: Self-pay | Admitting: Family Medicine

## 2017-03-07 ENCOUNTER — Ambulatory Visit (INDEPENDENT_AMBULATORY_CARE_PROVIDER_SITE_OTHER): Payer: 59 | Admitting: Family Medicine

## 2017-03-07 VITALS — BP 149/79 | HR 84 | Wt 262.2 lb

## 2017-03-07 DIAGNOSIS — Z01419 Encounter for gynecological examination (general) (routine) without abnormal findings: Secondary | ICD-10-CM | POA: Diagnosis not present

## 2017-03-07 DIAGNOSIS — R110 Nausea: Secondary | ICD-10-CM

## 2017-03-07 DIAGNOSIS — Z1231 Encounter for screening mammogram for malignant neoplasm of breast: Secondary | ICD-10-CM

## 2017-03-07 MED ORDER — ONDANSETRON 4 MG PO TBDP: 4.0000 mg | ORAL_TABLET | Freq: Four times a day (QID) | ORAL | 2 refills | Status: DC | PRN

## 2017-03-07 NOTE — Progress Notes (Signed)
Last pap 01/2015 - Normal Last MM - 06/2016 - Normal

## 2017-03-07 NOTE — Progress Notes (Signed)
   GYNECOLOGY ANNUAL PREVENTATIVE CARE ENCOUNTER NOTE  Subjective:   Carrie Ellis is a 52 y.o. [id number] female here for a routine annual gynecologic exam.  Current complaints: None- currently on menses.   Denies abnormal vaginal bleeding, discharge, pelvic pain, problems with intercourse or other gynecologic concerns.    Gynecologic History Patient's last menstrual period was 03/07/2017. Contraception: none- sexually active Last Pap: 2016. Results were: normal Last mammogram: 06/19/2016. Results were: normal  The following portions of the patient's history were reviewed and updated as appropriate: allergies, current medications, past family history, past medical history, past social history, past surgical history and problem list.  Review of Systems Pertinent items are noted in HPI.   Objective:  BP (!) 149/79   Pulse 84   Wt 262 lb 3.2 oz (118.9 kg)   LMP 03/07/2017   BMI 37.62 kg/m  CONSTITUTIONAL: Well-developed, well-nourished female in no acute distress.  HENT:  Normocephalic, atraumatic, External right and left ear normal. Oropharynx is clear and moist EYES:  No scleral icterus.  NECK: Normal range of motion, supple, no masses.  Normal thyroid.  SKIN: Skin is warm and dry. No rash noted. Not diaphoretic. No erythema. No pallor. NEUROLOGIC: Alert and oriented to person, place, and time. Normal reflexes, muscle tone coordination. No cranial nerve deficit noted. PSYCHIATRIC: Normal mood and affect. Normal behavior. Normal judgment and thought content. CARDIOVASCULAR: Normal heart rate noted, regular rhythm. 2+ distal pulses. RESPIRATORY: Effort and breath sounds normal, no problems with respiration noted. BREASTS: Symmetric in size. No masses, skin changes, nipple drainage, or lymphadenopathy. ABDOMEN: Soft,  no distention noted.  No tenderness, rebound or guarding.  PELVIC: Deferred- heavy menses MUSCULOSKELETAL: Normal range of motion.     Assessment and Plan:  1)  Annual gynecologic: Will follow up results of pap smear and manage accordingly. STI screen offered, declined.  Routine preventative health maintenance measures emphasized.  2) HCM- discussed need for colonoscopy. Updated HCM appropriately.   3) Perimenopause- discussed changes associated with menopause. Reviewed resources here.   Please refer to After Visit Summary for other counseling recommendations.   Return in 1 year (on 03/07/2018) for Yearly wellness exam.  Federico FlakeKimberly Niles Lafreda Casebeer, MD, MPH, ABFM Attending Physician Faculty Practice[facility]

## 2017-03-12 ENCOUNTER — Encounter: Payer: Self-pay | Admitting: Internal Medicine

## 2017-03-12 ENCOUNTER — Ambulatory Visit (INDEPENDENT_AMBULATORY_CARE_PROVIDER_SITE_OTHER): Payer: 59 | Admitting: Internal Medicine

## 2017-03-12 VITALS — BP 136/78 | HR 92 | Ht 70.0 in | Wt 264.6 lb

## 2017-03-12 DIAGNOSIS — E042 Nontoxic multinodular goiter: Secondary | ICD-10-CM | POA: Diagnosis not present

## 2017-03-12 DIAGNOSIS — E0590 Thyrotoxicosis, unspecified without thyrotoxic crisis or storm: Secondary | ICD-10-CM | POA: Diagnosis not present

## 2017-03-12 LAB — TSH: TSH: 0.05 u[IU]/mL — ABNORMAL LOW (ref 0.35–4.50)

## 2017-03-12 LAB — T4, FREE: Free T4: 1 ng/dL (ref 0.60–1.60)

## 2017-03-12 LAB — T3, FREE: T3, Free: 3.4 pg/mL (ref 2.3–4.2)

## 2017-03-12 NOTE — Progress Notes (Signed)
Patient ID: Carrie Ellis, female   DOB: December 07, 1965, 52 y.o.   MRN: [id number]    HPI  Carrie Ellis is a 52 y.o.-year-old female, returning for follow-up for multiple thyroid nodules and thyrotoxicosis.  Last visit 4 months ago.  Reviewed history: Pt has had a low TSH since 1990s>> saw Dr. Margaretmary Bayley >> was on medications >> then TFTs normalized >> stopped the medication (cannot remember name).  After this, she started to see Dr. Dallas Schimke and her TFTs started to become abnormal again in 2017.  I reviewed pt's thyroid tests: Lab Results  Component Value Date   TSH 0.03 (L) 11/09/2016   TSH 0.03 (L) 09/18/2016   TSH 0.03 (L) 04/14/2015   TSH 0.07 (L) 03/29/2015   TSH 0.38 10/23/2011   TSH 0.29 (L) 05/05/2009   FREET4 1.00 11/09/2016   FREET4 1.6 09/18/2016   FREET4 1.14 04/14/2015   FREET4 1.0 05/05/2009  2011: TSH 0.29, normal free hormones  TSI's have not been elevated: Lab Results  Component Value Date   TSI <89 11/09/2016   Thyroid ultrasound (04/20/2006 compared with 05/13/2004) showed multinodular goiter with slight interval increase in size of the right inferior thyroid nodule and left inferior thyroid nodule, but otherwise stable size of the nodules, with slightly enlarged thyroid.  At last visit, we checked a thyroid uptake and scan (11/28/2016): She had toxic bilateral thyroid adenomas. Hyper functional BILATERAL thyroid nodules with suppression of uptake in remaining thyroid tissue compatible with hyper functional thyroid adenomas. 24 hour radio iodine uptake of 26%.  After the results returned, I sent patient a message through my chart, recommending RAI tx, however, she did not read the message...  Pt denies: - feeling nodules in neck - hoarseness - dysphagia - choking - SOB with lying down  Pt denies: - weight loss - heat intolerance - tremors - palpitations - anxiety - hyperdefecation - hair loss  Pt does have a FH of thyroid ds -  mother (hypothyroidism). No FH of thyroid cancer. No h/o radiation tx to head or neck.  No seaweed or kelp. No recent contrast studies. No herbal supplements. No Biotin use. No recent steroids use.   Pt. also has a history of HL, DM2 - last HbA1c: 6.2%, HTN, GERD.  ROS: Constitutional: + see HPI Eyes: no blurry vision, no xerophthalmia ENT: no sore throat, + see HPI Cardiovascular: no CP/no SOB/no palpitations/no leg swelling Respiratory: no cough/no SOB/no wheezing Gastrointestinal: no N/no V/no D/no C/no acid reflux Musculoskeletal: no muscle aches/no joint aches Skin: no rashes, no hair loss Neurological: no tremors/no numbness/no tingling/no dizziness  I reviewed pt's medications, allergies, PMH, social hx, family hx, and changes were documented in the history of present illness. Otherwise, unchanged from my initial visit note.  Past Medical History:  Diagnosis Date  . Allergy   . Diabetes mellitus type 2 in obese (HCC) 04/14/2015  . Hypertension   . Hypothyroid   . Positive PPD, treated    9 months of treatment   Past Surgical History:  Procedure Laterality Date  . CHOLECYSTECTOMY  1999  . TONSILLECTOMY  1995   Social History   Social History  . Marital status: Married    Spouse name: N/A  . Number of children: 2   Occupational History  . 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 Smoking status: Never Smoker  . Smokeless tobacco: Never Used  . Alcohol use  No  . Drug use: No  . Sexual activity: Not Currently    Partners: Male    Birth control/ protection: OCP, Other-see comments     Comment: husband had vasectomy   Current Outpatient Medications on File Prior to Visit  Medication Sig Dispense Refill  . atorvastatin (LIPITOR) 20 MG tablet Take 1 tablet (20 mg total) by mouth daily. 90 tablet 3  . losartan (COZAAR) 50 MG tablet Take 1 tablet (50 mg total) by mouth daily. 90 tablet 3  . metFORMIN  (GLUCOPHAGE-XR) 500 MG 24 hr tablet TAKE 1 TABLET(500 MG) BY MOUTH DAILY WITH BREAKFAST 90 tablet 3  . Multiple Vitamin (MULTIVITAMIN) tablet Take 1 tablet by mouth daily.      . ondansetron (ZOFRAN ODT) 4 MG disintegrating tablet Take 1 tablet (4 mg total) by mouth every 6 (six) hours as needed for nausea. 20 tablet 2  . scopolamine (TRANSDERM-SCOP, 1.5 MG,) 1 MG/3DAYS Place 1 patch (1.5 mg total) onto the skin every 3 (three) days. (Patient not taking: Reported on 11/09/2016) 10 patch 3   No current facility-administered medications on file prior to visit.    Allergies  Allergen Reactions  . Penicillins     REACTION: rash  . Ace Inhibitors Cough  . Clarithromycin Nausea Only    REACTION: Extreme nausea  . Diclofenac Nausea Only   Family History  Problem Relation Age of Onset  . Stroke Other   . Diabetes Other   . Cancer Mother   . Heart disease Mother   . Alcohol abuse Neg Hx   . Mental illness Neg Hx    PE: BP 136/78   Pulse 92   Ht 5\' 10"  (1.778 m)   Wt 264 lb 9.6 oz (120 kg)   LMP 03/07/2017   SpO2 99%   BMI 37.97 kg/m  Wt Readings from Last 3 Encounters:  03/12/17 264 lb 9.6 oz (120 kg)  03/07/17 262 lb 3.2 oz (118.9 kg)  11/09/16 263 lb (119.3 kg)   Constitutional: overweight, in NAD Eyes: PERRLA, EOMI, no exophthalmos ENT: moist mucous membranes, + slight symmetric thyromegaly, no cervical lymphadenopathy Cardiovascular: tachycardia, RR, No MRG Respiratory: CTA B Gastrointestinal: abdomen soft, NT, ND, BS+ Musculoskeletal: no deformities, strength intact in all 4 Skin: moist, warm, no rashes Neurological: no tremor with outstretched hands, DTR normal in all 4   ASSESSMENT: 1. Subclinical Thyrotoxicosis  2. MNG  PLAN:  1. Patient with a long history of subclinical thyrotoxicosis, without thyrotoxic symptoms: Weight loss, heat intolerance, hyper defecation, palpitations, anxiety.  She has tachycardia but this appears to be related to coming to the doctor.   She has a fit bit and her heart rate is not high at home. - We reviewed together her previous imaging studies reports: Thyroid ultrasound showed multinodular goiter, and the thyroid uptake and scan obtained after last visit showed that 2 of the thyroid nodules are hyperfunctioning.  At that time, I sent her a message through my chart advising her about RAI treatment.  However, she did not read the message.  We again discussed about definitive treatment for toxic adenomas and I again suggested RAI treatment, but will check TFTs first to see if improving >> if so, we can postpone the tx. We also discussed about possible SEs 2/2 RAI tx >> hypothyroidism. I explained thet thyroid sx would be the last resort. - we will check her TFTs today - We will not add beta-blockers today, but she will let me know if heart  rate stays higher than 90 while at rest - I will see her back in 4 months.  2. MNG - No neck compression symptoms - we will probably need another ultrasound ~1 year after she becomes euthyroid  Carlus Pavlov, MD PhD [facility] Endocrinology

## 2017-03-12 NOTE — Patient Instructions (Signed)
Please stop at the lab.  Please come back for a follow-up appointment in 4 months.   

## 2017-03-26 ENCOUNTER — Encounter: Payer: Self-pay | Admitting: *Deleted

## 2017-03-26 ENCOUNTER — Other Ambulatory Visit: Payer: Self-pay

## 2017-03-26 ENCOUNTER — Encounter: Payer: Self-pay | Admitting: Family Medicine

## 2017-03-26 ENCOUNTER — Ambulatory Visit (INDEPENDENT_AMBULATORY_CARE_PROVIDER_SITE_OTHER): Payer: 59 | Admitting: Family Medicine

## 2017-03-26 VITALS — BP 110/70 | HR 87 | Temp 98.6°F | Ht 71.5 in | Wt 266.5 lb

## 2017-03-26 DIAGNOSIS — E1169 Type 2 diabetes mellitus with other specified complication: Secondary | ICD-10-CM | POA: Diagnosis not present

## 2017-03-26 DIAGNOSIS — I10 Essential (primary) hypertension: Secondary | ICD-10-CM

## 2017-03-26 DIAGNOSIS — E669 Obesity, unspecified: Secondary | ICD-10-CM | POA: Diagnosis not present

## 2017-03-26 LAB — HEMOGLOBIN A1C: Hgb A1c MFr Bld: 6.8 % — ABNORMAL HIGH (ref 4.6–6.5)

## 2017-03-26 MED ORDER — LOSARTAN POTASSIUM 50 MG PO TABS: 50.0000 mg | ORAL_TABLET | Freq: Every day | ORAL | 3 refills | Status: DC

## 2017-03-26 MED ORDER — METFORMIN HCL ER 500 MG PO TB24: ORAL_TABLET | ORAL | 3 refills | Status: DC

## 2017-03-26 NOTE — Progress Notes (Signed)
Dr. Karleen Hampshire T. Kellin Bartling, MD, CAQ Sports Medicine [facility] [address] [place], [address] Phone: [phone] Fax: [phone]  03/26/2017  Patient: MIKEA QUADROS, MRN: [id number], DOB: 07/19/1965, 52 y.o.  Primary Physician:  Hannah Beat, MD   Chief Complaint  Patient presents with  . Follow-up    6 month   Subjective:   Baleigh Rennaker Kunka is a 52 y.o. very pleasant female patient who presents with the following:  Diabetes Mellitus: Tolerating Medications: yes Compliance with diet: fair Exercise: minimal / intermittent Avg blood sugars at home: not checking Foot problems: none Hypoglycemia: none No nausea, vomitting, blurred vision, polyuria.  Lab Results  Component Value Date   HGBA1C 6.3 (H) 09/18/2016   HGBA1C 6.2 03/22/2016   HGBA1C 6.1 10/06/2015   Lab Results  Component Value Date   MICROALBUR 17.8 09/18/2016   LDLCALC 119 (H) 09/18/2016   CREATININE 0.79 09/18/2016    Wt Readings from Last 3 Encounters:  03/26/17 266 lb 8 oz (120.9 kg)  03/12/17 264 lb 9.6 oz (120 kg)  03/07/17 262 lb 3.2 oz (118.9 kg)    Body mass index is 36.65 kg/m.   HTN: Tolerating all medications without side effects Stable and at goal No CP, no sob. No HA.  BP Readings from Last 3 Encounters:  03/26/17 110/70  03/12/17 136/78  03/07/17 (!) 149/79    Basic Metabolic Panel:    Component Value Date/Time   NA 136 09/18/2016 1117   K 3.9 09/18/2016 1117   CL 105 09/18/2016 1117   CO2 18 (L) 09/18/2016 1117   BUN 8 09/18/2016 1117   CREATININE 0.79 09/18/2016 1117   GLUCOSE 118 (H) 09/18/2016 1117   CALCIUM 9.0 09/18/2016 1117     Past Medical History, Surgical History, Social History, Family History, Problem List, Medications, and Allergies have been reviewed and updated if relevant.  Patient Active Problem List   Diagnosis Date Noted  . Thyrotoxicosis without thyroid storm 11/09/2016  . Multinodular goiter  11/09/2016  . Diabetes mellitus type 2 in obese (HCC) 04/14/2015  . Fatigue 05/05/2009  . Essential hypertension 12/31/2008  . ALLERGIC RHINITIS 12/31/2008    Past Medical History:  Diagnosis Date  . Allergy   . Diabetes mellitus type 2 in obese (HCC) 04/14/2015  . Hypertension   . Hypothyroid   . Positive PPD, treated    9 months of treatment    Past Surgical History:  Procedure Laterality Date  . CHOLECYSTECTOMY  1999  . TONSILLECTOMY  1995    Social History   Socioeconomic History  . Marital status: Married    Spouse name: Not on file  . Number of children: Not on file  . Years of education: Not on file  . Highest education level: Not on file  Social Needs  . Financial resource strain: Not on file  . Food insecurity - worry: Not on file  . Food insecurity - inability: Not on file  . Transportation needs - medical: Not on file  . Transportation needs - non-medical: Not on file  Occupational History  . Occupation: Chief Technology Officer: [employer]    Comment: [employer]  Tobacco Use  . Smoking status: Never Smoker  . Smokeless tobacco: Never Used  Substance and Sexual Activity  . Alcohol use: No  . Drug use: No  . Sexual activity: Not Currently    Partners: Male    Birth control/protection: OCP,  Other-see comments    Comment: husband had vasectomy  Other Topics Concern  . Not on file  Social History Narrative  . Not on file    Family History  Problem Relation Age of Onset  . Stroke Other   . Diabetes Other   . Cancer Mother   . Heart disease Mother   . Alcohol abuse Neg Hx   . Mental illness Neg Hx     Allergies  Allergen Reactions  . Penicillins     REACTION: rash  . Ace Inhibitors Cough  . Clarithromycin Nausea Only    REACTION: Extreme nausea  . Diclofenac Nausea Only    Medication list reviewed and updated in full in [facility].   GEN: No acute illnesses, no fevers, chills. GI: No n/v/d,  eating normally Pulm: No SOB Interactive and getting along well at home.  Otherwise, ROS is as per the HPI.  Objective:   BP 110/70   Pulse 87   Temp 98.6 F (37 C) (Oral)   Ht 5' 11.5" (1.816 m)   Wt 266 lb 8 oz (120.9 kg)   LMP 03/07/2017   BMI 36.65 kg/m   GEN: WDWN, NAD, Non-toxic, A & O x 3 HEENT: Atraumatic, Normocephalic. Neck supple. No masses, No LAD. Ears and Nose: No external deformity. CV: RRR, No M/G/R. No JVD. No thrill. No extra heart sounds. PULM: CTA B, no wheezes, crackles, rhonchi. No retractions. No resp. distress. No accessory muscle use. EXTR: No c/c/e NEURO Normal gait.  PSYCH: Normally interactive. Conversant. Not depressed or anxious appearing.  Calm demeanor.   Laboratory and Imaging Data:  Assessment and Plan:   Diabetes mellitus type 2 in obese [facility](HCC) - Plan: Hemoglobin A1c  Essential hypertension  Await labs - by history doing well  Follow-up: Return in about 6 months (around 09/23/2017) for Complete physical.  Meds ordered this encounter  Medications  . losartan (COZAAR) 50 MG tablet    Sig: Take 1 tablet (50 mg total) by mouth daily.    Dispense:  90 tablet    Refill:  3  . metFORMIN (GLUCOPHAGE-XR) 500 MG 24 hr tablet    Sig: TAKE 1 TABLET(500 MG) BY MOUTH DAILY WITH BREAKFAST    Dispense:  90 tablet    Refill:  3   Medications Discontinued During This Encounter  Medication Reason  . losartan (COZAAR) 50 MG tablet Reorder  . metFORMIN (GLUCOPHAGE-XR) 500 MG 24 hr tablet Reorder   Orders Placed This Encounter  Procedures  . Hemoglobin A1c    Signed,  Laiken Nohr T. Kayra Crowell, MD   Allergies as of 03/26/2017      Reactions   Penicillins    REACTION: rash   Ace Inhibitors Cough   Clarithromycin Nausea Only   REACTION: Extreme nausea   Diclofenac Nausea Only      Medication List        Accurate as of 03/26/17  8:30 AM. Always use your most recent med list.          atorvastatin 20 MG tablet Commonly known as:   LIPITOR Take 1 tablet (20 mg total) by mouth daily.   losartan 50 MG tablet Commonly known as:  COZAAR Take 1 tablet (50 mg total) by mouth daily.   metFORMIN 500 MG 24 hr tablet Commonly known as:  GLUCOPHAGE-XR TAKE 1 TABLET(500 MG) BY MOUTH DAILY WITH BREAKFAST   multivitamin tablet Take 1 tablet by mouth daily.   ondansetron 4 MG disintegrating tablet Commonly known  as:  ZOFRAN ODT Take 1 tablet (4 mg total) by mouth every 6 (six) hours as needed for nausea.   scopolamine 1 MG/3DAYS Commonly known as:  TRANSDERM-SCOP (1.5 MG) Place 1 patch (1.5 mg total) onto the skin every 3 (three) days.

## 2017-04-01 ENCOUNTER — Other Ambulatory Visit: Payer: Self-pay | Admitting: Family Medicine

## 2017-05-16 ENCOUNTER — Encounter: Payer: Self-pay | Admitting: Family Medicine

## 2017-05-16 ENCOUNTER — Ambulatory Visit (INDEPENDENT_AMBULATORY_CARE_PROVIDER_SITE_OTHER): Payer: 59 | Admitting: Family Medicine

## 2017-05-16 ENCOUNTER — Other Ambulatory Visit: Payer: Self-pay | Admitting: Family Medicine

## 2017-05-16 VITALS — BP 138/82 | HR 98 | Temp 98.0°F | Wt 262.0 lb

## 2017-05-16 DIAGNOSIS — R51 Headache: Secondary | ICD-10-CM | POA: Diagnosis not present

## 2017-05-16 DIAGNOSIS — I10 Essential (primary) hypertension: Secondary | ICD-10-CM

## 2017-05-16 DIAGNOSIS — E669 Obesity, unspecified: Secondary | ICD-10-CM

## 2017-05-16 DIAGNOSIS — M79645 Pain in left finger(s): Secondary | ICD-10-CM

## 2017-05-16 DIAGNOSIS — R519 Headache, unspecified: Secondary | ICD-10-CM | POA: Insufficient documentation

## 2017-05-16 DIAGNOSIS — E1169 Type 2 diabetes mellitus with other specified complication: Secondary | ICD-10-CM

## 2017-05-16 MED ORDER — NAPROXEN 500 MG PO TABS: ORAL_TABLET | ORAL | 0 refills | Status: DC

## 2017-05-16 NOTE — Patient Instructions (Signed)
I think you have tendonitis of the left middle finger from repetitive use.  Treat with hand rest, naprosyn anti inflammatory 500mg  twice daily with meals for 5 days then as needed.  This will also help sinus congestion and headache - likely from sinus inflammation.  Let us know if new rash develops, or spreading redness or swelling of finger, or fevers/chills, worsening or just not improving (for further evaluation including labs and xray)

## 2017-05-16 NOTE — Assessment & Plan Note (Signed)
Anticipate finger tendonitis from repetitive use - rec resting hand, limited naprosyn course (in h/o HTN). Update if not improving with treatment. Pt agrees with plan.  No signs of fracture (no mechanism of injury), or infection.

## 2017-05-16 NOTE — Assessment & Plan Note (Signed)
Anticipate sinus congestion/inflammation without infection.  Treat with naprosyn.  Update if worsening symptoms for further eval/treatment.

## 2017-05-16 NOTE — Progress Notes (Signed)
BP 138/82 (BP Location: Left Arm, Patient Position: Sitting, Cuff Size: Large)   Pulse 98   Temp 98 F (36.7 C) (Oral)   Wt 262 lb (118.8 kg)   LMP 04/15/2017   SpO2 99%   BMI 36.03 kg/m    CC: HA, hand pain Subjective:    Patient ID: Carrie Ellis, female    DOB: May 05, 1965, 52 y.o.   MRN: [id number]  HPI: Carrie Ellis is a 52 y.o. female presenting on 05/16/2017 for Hand Pain (Throbbing pain in left middle finger. Occurs with extension. Started 05/12/17. Tried Tylenol, not helpful. ) and Headache (Sinus HA and nasal congestion.)   Several day h/o L middle finger pain, throbbing distal to PIP joint. Denies inciting trauma/injury or falls. Pain started 05/12/2017. Treating with OTC tylenol with temporary improvement. No redness or warmth or swelling of finger. Denies significant joint pains or rash. Works on [employer] all day.   Woke up this morning PNdrainage, bilateral facial discomfort.   No fevers/chills, cough, dyspnea or wheezing.   Relevant past medical, surgical, family and social history reviewed and updated as indicated. Interim medical history since our last visit reviewed. Allergies and medications reviewed and updated. Outpatient Medications Prior to Visit  Medication Sig Dispense Refill  . atorvastatin (LIPITOR) 20 MG tablet Take 1 tablet (20 mg total) by mouth daily. 90 tablet 3  . losartan (COZAAR) 50 MG tablet Take 1 tablet (50 mg total) by mouth daily. 90 tablet 3  . metFORMIN (GLUCOPHAGE-XR) 500 MG 24 hr tablet TAKE 1 TABLET(500 MG) BY MOUTH DAILY WITH BREAKFAST 90 tablet 3  . Multiple Vitamin (MULTIVITAMIN) tablet Take 1 tablet by mouth daily.      . ondansetron (ZOFRAN ODT) 4 MG disintegrating tablet Take 1 tablet (4 mg total) by mouth every 6 (six) hours as needed for nausea. 20 tablet 2  . scopolamine (TRANSDERM-SCOP, 1.5 MG,) 1 MG/3DAYS Place 1 patch (1.5 mg total) onto the skin every 3 (three) days. 10 patch 3   No facility-administered  medications prior to visit.      Per HPI unless specifically indicated in ROS section below Review of Systems     Objective:    BP 138/82 (BP Location: Left Arm, Patient Position: Sitting, Cuff Size: Large)   Pulse 98   Temp 98 F (36.7 C) (Oral)   Wt 262 lb (118.8 kg)   LMP 04/15/2017   SpO2 99%   BMI 36.03 kg/m   Wt Readings from Last 3 Encounters:  05/16/17 262 lb (118.8 kg)  03/26/17 266 lb 8 oz (120.9 kg)  03/12/17 264 lb 9.6 oz (120 kg)    Physical Exam  Constitutional: She appears well-developed and well-nourished. No distress.  HENT:  Head: Normocephalic and atraumatic.  Right Ear: Hearing, tympanic membrane, external ear and ear canal normal.  Left Ear: Hearing, tympanic membrane, external ear and ear canal normal.  Nose: Mucosal edema present. No rhinorrhea. Right sinus exhibits maxillary sinus tenderness. Right sinus exhibits no frontal sinus tenderness. Left sinus exhibits maxillary sinus tenderness. Left sinus exhibits no frontal sinus tenderness.  Mouth/Throat: Uvula is midline, oropharynx is clear and moist and mucous membranes are normal. No oropharyngeal exudate, posterior oropharyngeal edema, posterior oropharyngeal erythema or tonsillar abscesses.  Post nasal drainage present  Eyes: Pupils are equal, round, and reactive to light. Conjunctivae and EOM are normal. No scleral icterus.  Neck: Normal range of motion. Neck supple.  Cardiovascular: Normal rate, regular rhythm, normal heart sounds and intact distal  pulses.  No murmur heard. Pulmonary/Chest: Effort normal and breath sounds normal. No respiratory distress. She has no wheezes. She has no rales.  Musculoskeletal: She exhibits no edema.  2+ DP bilaterally R hand WNL L hand - marked discomfort to palpation entire ventral and dorsal aspect of 3rd digit distal to MCP without obvious redness, swelling, warmth, or active synovitis with limited flexion/extension from pain not stiffness. Tender to palpation of  PIP and DIP joints.   Lymphadenopathy:    She has no cervical adenopathy.  Skin: Skin is warm and dry. No rash noted. No erythema.  Nursing note and vitals reviewed.  Results for orders placed or performed in visit on 03/26/17  Hemoglobin A1c  Result Value Ref Range   Hgb A1c MFr Bld 6.8 (H) 4.6 - 6.5 %      Assessment & Plan:   Problem List Items Addressed This Visit    Diabetes mellitus type 2 in obese (HCC) (Chronic)   Essential hypertension   Pain of left middle finger - Primary    Anticipate finger tendonitis from repetitive use - rec resting hand, limited naprosyn course (in h/o HTN). Update if not improving with treatment. Pt agrees with plan.  No signs of fracture (no mechanism of injury), or infection.       Sinus headache    Anticipate sinus congestion/inflammation without infection.  Treat with naprosyn.  Update if worsening symptoms for further eval/treatment.       Relevant Medications   naproxen (NAPROSYN) 500 MG tablet       Meds ordered this encounter  Medications  . naproxen (NAPROSYN) 500 MG tablet    Sig: Take one po bid x 5 day then prn pain, take with food    Dispense:  30 tablet    Refill:  0   No orders of the defined types were placed in this encounter.   Follow up plan: No follow-ups on file.  Eustaquio BoydenJavier Najee Manninen, MD

## 2017-05-21 ENCOUNTER — Ambulatory Visit: Payer: 59 | Admitting: Family Medicine

## 2017-06-20 ENCOUNTER — Ambulatory Visit
Admission: RE | Admit: 2017-06-20 | Discharge: 2017-06-20 | Disposition: A | Discharge status: Home / Self Care | Payer: 59 | Source: Ambulatory Visit | Attending: Family Medicine | Admitting: Family Medicine

## 2017-06-20 DIAGNOSIS — Z1231 Encounter for screening mammogram for malignant neoplasm of breast: Secondary | ICD-10-CM | POA: Diagnosis not present

## 2017-07-10 ENCOUNTER — Encounter: Payer: Self-pay | Admitting: Internal Medicine

## 2017-07-10 ENCOUNTER — Ambulatory Visit (INDEPENDENT_AMBULATORY_CARE_PROVIDER_SITE_OTHER): Payer: 59 | Admitting: Internal Medicine

## 2017-07-10 VITALS — BP 140/88 | HR 100 | Ht 71.5 in | Wt 267.0 lb

## 2017-07-10 DIAGNOSIS — E042 Nontoxic multinodular goiter: Secondary | ICD-10-CM | POA: Diagnosis not present

## 2017-07-10 DIAGNOSIS — E0590 Thyrotoxicosis, unspecified without thyrotoxic crisis or storm: Secondary | ICD-10-CM | POA: Diagnosis not present

## 2017-07-10 LAB — TSH: TSH: 0.08 u[IU]/mL — AB (ref 0.35–4.50)

## 2017-07-10 LAB — T3, FREE: T3, Free: 3.3 pg/mL (ref 2.3–4.2)

## 2017-07-10 LAB — T4, FREE: Free T4: 0.87 ng/dL (ref 0.60–1.60)

## 2017-07-10 NOTE — Patient Instructions (Signed)
Please stop at the lab.  Please come back for a follow-up appointment in 6 months.  

## 2017-07-10 NOTE — Progress Notes (Signed)
Patient ID: Carrie Ellis, female   DOB: [birth year], 52 y.o.   MRN: [id number]    HPI  Carrie Ellis is a 52 y.o.-year-old female, returning for follow-up for multiple thyroid nodules and thyrotoxicosis.  Last visit 4 months ago.  Reviewed hx: Pt has had a low TSH since 1990s>> saw Dr. Margaretmary Bayley >> was on medications >> then TFTs normalized >> stopped the medication (cannot remember name).   After this, she started to see Dr. Dallas Schimke and her TFTs started to become abnormal again in 2017.  I reviewed pt's thyroid tests: Lab Results  Component Value Date   TSH 0.05 (L) 03/12/2017   TSH 0.03 (L) 11/09/2016   TSH 0.03 (L) 09/18/2016   TSH 0.03 (L) 04/14/2015   TSH 0.07 (L) 03/29/2015   TSH 0.38 10/23/2011   TSH 0.29 (L) 05/05/2009   FREET4 1.00 03/12/2017   FREET4 1.00 11/09/2016   FREET4 1.6 09/18/2016   FREET4 1.14 04/14/2015   FREET4 1.0 05/05/2009  2011: TSH 0.29, normal free hormones  TSIs are not high: Lab Results  Component Value Date   TSI <89 11/09/2016   Thyroid ultrasound (04/20/2006 compared with 05/13/2004) showed multinodular goiter with slight interval increase in size of the right inferior thyroid nodule and left inferior thyroid nodule, but otherwise stable size of the nodules, with slightly enlarged thyroid.  At last visit, we checked a thyroid uptake and scan (11/28/2016): She had toxic bilateral thyroid adenomas. Hyper functional BILATERAL thyroid nodules with suppression of uptake in remaining thyroid tissue compatible with hyper functional thyroid adenomas. 24 hour radio iodine uptake of 26%.  After the results returned, I sent patient a message through my chart, recommending RAI tx, however, she did not read the message...  Pt denies: - feeling nodules in neck - hoarseness - dysphagia - choking - SOB with lying down  Pt denies: - weight loss - heat intolerance - tremors - palpitations - anxiety - hyperdefecation - hair  loss  Pt does have a FH of thyroid ds - mother (hypothyroidism). No FH of thyroid cancer. No h/o radiation tx to head or neck.  No seaweed or kelp. No recent contrast studies. No herbal supplements. No Biotin use. No recent steroids use.   Pt. also has a history of HL, DM2 - last HbA1c: 6.2%, HTN, GERD.  ROS: Constitutional: +see HPI Eyes: no blurry vision, no xerophthalmia ENT: no sore throat, + see HPI Cardiovascular: no CP/no SOB/no palpitations/no leg swelling Respiratory: no cough/no SOB/no wheezing Gastrointestinal: no N/no V/no D/no C/no acid reflux Musculoskeletal: no muscle aches/no joint aches Skin: no rashes, no hair loss Neurological: no tremors/no numbness/no tingling/no dizziness  I reviewed pt's medications, allergies, PMH, social hx, family hx, and changes were documented in the history of present illness. Otherwise, unchanged from my initial visit note.  Past Medical History:  Diagnosis Date  . Allergy   . Diabetes mellitus type 2 in obese (HCC) 04/14/2015  . Hypertension   . Hypothyroid   . Positive PPD, treated    9 months of treatment   Past Surgical History:  Procedure Laterality Date  . CHOLECYSTECTOMY  1999  . TONSILLECTOMY  1995   Social History   Social History  . Marital status: Married    Spouse name: N/A  . Number of children: 2   Occupational History  . Clerical Accounting Tech [employer]   Social History Main Topics  . Smoking status: Never Smoker  .  Smokeless tobacco: Never Used  . Alcohol use No  . Drug use: No  . Sexual activity: Not Currently    Partners: Male    Birth control/ protection: OCP, Other-see comments     Comment: husband had vasectomy   Current Outpatient Medications on File Prior to Visit  Medication Sig Dispense Refill  . atorvastatin (LIPITOR) 20 MG tablet Take 1 tablet (20 mg total) by mouth daily. 90 tablet 3  . losartan (COZAAR) 50 MG tablet Take 1 tablet (50 mg total)  by mouth daily. 90 tablet 3  . metFORMIN (GLUCOPHAGE-XR) 500 MG 24 hr tablet TAKE 1 TABLET(500 MG) BY MOUTH DAILY WITH BREAKFAST 90 tablet 3  . Multiple Vitamin (MULTIVITAMIN) tablet Take 1 tablet by mouth daily.      . naproxen (NAPROSYN) 500 MG tablet Take one po bid x 5 day then prn pain, take with food 30 tablet 0  . ondansetron (ZOFRAN ODT) 4 MG disintegrating tablet Take 1 tablet (4 mg total) by mouth every 6 (six) hours as needed for nausea. 20 tablet 2  . scopolamine (TRANSDERM-SCOP, 1.5 MG,) 1 MG/3DAYS Place 1 patch (1.5 mg total) onto the skin every 3 (three) days. 10 patch 3   No current facility-administered medications on file prior to visit.    Allergies  Allergen Reactions  . Penicillins     REACTION: rash  . Ace Inhibitors Cough  . Clarithromycin Nausea Only    REACTION: Extreme nausea  . Diclofenac Nausea Only   Family History  Problem Relation Age of Onset  . Stroke Other   . Diabetes Other   . Cancer Mother   . Heart disease Mother   . Alcohol abuse Neg Hx   . Mental illness Neg Hx    PE: BP 140/88 (BP Location: Right Arm, Patient Position: Sitting, Cuff Size: Large)   Pulse 100   Ht 5' 11.5" (1.816 m)   Wt 267 lb (121.1 kg)   SpO2 98%   BMI 36.72 kg/m  Wt Readings from Last 3 Encounters:  07/10/17 267 lb (121.1 kg)  05/16/17 262 lb (118.8 kg)  03/26/17 266 lb 8 oz (120.9 kg)   Constitutional: overweight, in NAD Eyes: PERRLA, EOMI, no exophthalmos ENT: moist mucous membranes, + slight thyromegaly, no cervical lymphadenopathy Cardiovascular: tachycardia, RR, No MRG Respiratory: CTA B Gastrointestinal: abdomen soft, NT, ND, BS+ Musculoskeletal: no deformities, strength intact in all 4 Skin: moist, warm, no rashes Neurological: no tremor with outstretched hands, DTR normal in all 4  ASSESSMENT: 1. Subclinical Thyrotoxicosis  2. MNG  PLAN:  1. Patient with a long history of subclinical thyrotoxicosis, without thyrotoxic symptoms: No weight loss,  heat intolerance, hyper defecation, palpitations, anxiety.  She does have tachycardia, which appears to be white coat.  She has a fit bit and her heart rate is not high at home.   - Reviewed previous Imaging studies reports: Thyroid ultrasound showed multinodular goiter and thyroid uptake and scan showed that 2 of the thyroid nodules are hyperfunctioning.  At that time, I sent her a message through my chart about RAI treatment, however, she did not read the message.  We again discussed about the need for this at last visit and I ordered the test again.  She again did not go with my recommendation as she did not read the message....  At this point, we will check her TFTs again, but since asymptomatic and since compliance is a problem, we will try to follow her as conservatively as we  can.   - We did discuss about the definitive nature of RAI treatment and the fact that she will most likely need to take levothyroxine afterwards.  - we will check TFTs today - No beta-blockers needed since she is not tachycardic at home  but I advised her to let me know if her heart rate at home exceeds 90 - I will see her back in 6 months  2. MNG - No neck compression symptoms - We will need another thyroid ultrasound approximately 1 year after she becomes euthyroid  Office Visit on 07/10/2017  Component Date Value Ref Range Status  . TSH 07/10/2017 0.08* 0.35 - 4.50 uIU/mL Final  . Free T4 07/10/2017 0.87  0.60 - 1.60 ng/dL Final   Comment: Specimens from patients who are undergoing biotin therapy and /or ingesting biotin supplements may contain high levels of biotin.  The higher biotin concentration in these specimens interferes with this Free T4 assay.  Specimens that contain high levels  of biotin may cause false high results for this Free T4 assay.  Please interpret results in light of the total clinical presentation of the patient.    . T3, Free 07/10/2017 3.3  2.3 - 4.2 pg/mL Final   MMsg sent: Dear Ms  Goold, Thyroid tests are a little better. We can continue to just observe them for now. Will recheck the tests when you return to see me. Sincerely, Carlus Pavlov MD  Carlus Pavlov, MD PhD [facility] Endocrinology

## 2017-07-16 ENCOUNTER — Encounter: Payer: Self-pay | Admitting: Internal Medicine

## 2017-09-03 ENCOUNTER — Encounter: Payer: Self-pay | Admitting: Family Medicine

## 2017-09-03 ENCOUNTER — Ambulatory Visit (INDEPENDENT_AMBULATORY_CARE_PROVIDER_SITE_OTHER): Payer: 59 | Admitting: Family Medicine

## 2017-09-03 VITALS — BP 122/80 | HR 92 | Temp 98.8°F | Ht 71.5 in | Wt 267.0 lb

## 2017-09-03 DIAGNOSIS — H60313 Diffuse otitis externa, bilateral: Secondary | ICD-10-CM

## 2017-09-03 MED ORDER — NEOMYCIN-POLYMYXIN-HC 3.5-10000-1 OT SOLN: 3.0000 [drp] | Freq: Three times a day (TID) | OTIC | 0 refills | Status: DC

## 2017-09-03 NOTE — Progress Notes (Signed)
Dr. Karleen HampshireSpencer T. Sharina Petre, MD, CA[facility] Primary [facility] and Sports Medicine [address] [place], [address] Phone: [phone] Fax: [phone]  09/03/2017  Patient: Carrie NephewKimberly R Seger, MRN: [id number], DOB: Mar 10, 1965, 52 y.o.  Primary Physician:  Hannah Beatopland, Sua Spadafora, MD   Chief Complaint  Patient presents with  . Ear Pain    both, started 8-9 days ago   Subjective:   Carrie NephewKimberly R Scully is a 52 y.o. very pleasant female patient who presents with the following:  R > L ear pain, left. No preceding illness. Does have some hayfever.  Has a lot of wax in her ears.  Pain with ear movement.  Past Medical History, Surgical History, Social History, Family History, Problem List, Medications, and Allergies have been reviewed and updated if relevant.  Patient Active Problem List   Diagnosis Date Noted  . Pain of left middle finger 05/16/2017  . Sinus headache 05/16/2017  . Thyrotoxicosis without thyroid storm 11/09/2016  . Multinodular goiter 11/09/2016  . Diabetes mellitus type 2 in obese (HCC) 04/14/2015  . Fatigue 05/05/2009  . Essential hypertension 12/31/2008  . ALLERGIC RHINITIS 12/31/2008    Past Medical History:  Diagnosis Date  . Allergy   . Diabetes mellitus type 2 in obese ([facility]CC) 04/14/2015  . Hypertension   . Hypothyroid   . Positive PPD, treated    9 months of treatment    Past Surgical History:  Procedure Laterality Date  . CHOLECYSTECTOMY  1999  . TONSILLECTOMY  1995    Social History   Socioeconomic History  . Marital status: Married    Spouse name: Not on file  . Number of children: Not on file  . Years of education: Not on file  . Highest education level: Not on file  Occupational History  . Occupation: Chief Technology OfficerClerical Accounting Tech    [place] [employer]    Comment: [employer]  Social Needs  . Financial resource strain: Not on file  . Food insecurity:    Worry: Not on file    Inability: Not on file  .  Transportation needs:    Medical: Not on file    Non-medical: Not on file  Tobacco Use  . Smoking status: Never Smoker  . Smokeless tobacco: Never Used  Substance and Sexual Activity  . Alcohol use: No  . Drug use: No  . Sexual activity: Not Currently    Partners: Male    Birth control/protection: OCP, Other-see comments    Comment: husband had vasectomy  Lifestyle  . Physical activity:    Days per week: Not on file    Minutes per session: Not on file  . Stress: Not on file  Relationships  . Social connections:    Talks on phone: Not on file    Gets together: Not on file    Attends religious service: Not on file    Active member of club or organization: Not on file    Attends meetings of clubs or organizations: Not on file    Relationship status: Not on file  . Intimate partner violence:    Fear of current or ex partner: Not on file    Emotionally abused: Not on file    Physically abused: Not on file    Forced sexual activity: Not on file  Other Topics Concern  . Not on file  Social History Narrative  . Not on file    Family History  Problem Relation Age of Onset  . Stroke  Other   . Diabetes Other   . Cancer Mother   . Heart disease Mother   . Alcohol abuse Neg Hx   . Mental illness Neg Hx     Allergies  Allergen Reactions  . Penicillins     REACTION: rash  . Ace Inhibitors Cough  . Clarithromycin Nausea Only    REACTION: Extreme nausea  . Diclofenac Nausea Only    Medication list reviewed and updated in full in [facility].   GEN: No acute illnesses, no fevers, chills. GI: No n/v/d, eating normally Pulm: No SOB Interactive and getting along well at home.  Otherwise, ROS is as per the HPI.  Objective:   BP 122/80   Pulse 92   Temp 98.8 F (37.1 C) (Oral)   Ht 5' 11.5" (1.816 m)   Wt 267 lb (121.1 kg)   LMP 08/30/2017   BMI 36.72 kg/m   GEN: WDWN, NAD, Non-toxic, A & O x 3 HEENT: Atraumatic, Normocephalic. Neck supple. No masses, No  LAD. Ears and Nose: No external deformity. TM clear. Canals pinkish - reddish. Tragus and ear TTP with movement. CV: RRR, No M/G/R. No JVD. No thrill. No extra heart sounds. PULM: CTA B, no wheezes, crackles, rhonchi. No retractions. No resp. distress. No accessory muscle use. EXTR: No c/c/e NEURO Normal gait.  PSYCH: Normally interactive. Conversant. Not depressed or anxious appearing.  Calm demeanor.   Laboratory and Imaging Data:  Assessment and Plan:   Acute diffuse otitis externa of both ears  Treat as such  Follow-up: No follow-ups on file.  Meds ordered this encounter  Medications  . neomycin-polymyxin-hydrocortisone (CORTISPORIN) OTIC solution    Sig: Place 3 drops into both ears 3 (three) times daily.    Dispense:  10 mL    Refill:  0    Signed,  Jonny Dearden T. Vick Filter, MD   Allergies as of 09/03/2017      Reactions   Penicillins    REACTION: rash   Ace Inhibitors Cough   Clarithromycin Nausea Only   REACTION: Extreme nausea   Diclofenac Nausea Only      Medication List        Accurate as of 09/03/17  9:08 AM. Always use your most recent med list.          atorvastatin 20 MG tablet Commonly known as:  LIPITOR Take 1 tablet (20 mg total) by mouth daily.   losartan 50 MG tablet Commonly known as:  COZAAR Take 1 tablet (50 mg total) by mouth daily.   metFORMIN 500 MG 24 hr tablet Commonly known as:  GLUCOPHAGE-XR TAKE 1 TABLET(500 MG) BY MOUTH DAILY WITH BREAKFAST   multivitamin tablet Take 1 tablet by mouth daily.   naproxen 500 MG tablet Commonly known as:  NAPROSYN Take one po bid x 5 day then prn pain, take with food   neomycin-polymyxin-hydrocortisone OTIC solution Commonly known as:  CORTISPORIN Place 3 drops into both ears 3 (three) times daily.   ondansetron 4 MG disintegrating tablet Commonly known as:  ZOFRAN ODT Take 1 tablet (4 mg total) by mouth every 6 (six) hours as needed for nausea.   scopolamine 1 MG/3DAYS Commonly known  as:  TRANSDERM-SCOP (1.5 MG) Place 1 patch (1.5 mg total) onto the skin every 3 (three) days.

## 2017-09-23 NOTE — Progress Notes (Deleted)
Dr. Frederico Hamman T. Tayte Childers, MD, [facility] [place], [address] Phone: [phone] Fax: [phone]  09/24/2017  Patient: Carrie Ellis, MRN: [id number], DOB: 28-Nov-1965, 52 y.o.  Primary Physician:  Owens Loffler, MD   No chief complaint on file.  Subjective:   Carrie Ellis is a 52 y.o. pleasant patient who presents with the following:  Health Maintenance Summary Reviewed and updated, unless pt declines services.  Tobacco History Reviewed. Non-smoker Alcohol: No concerns, no excessive use Exercise Habits: Some activity, rec at least 30 mins 5 times a week STD concerns: none Drug Use: None Birth control method: Menses regular: yes Lumps or breast concerns: no Breast Cancer Family History: no  Health Maintenance  Topic Date Due  . FOOT EXAM  08/31/1975  . COLONOSCOPY  08/31/2015  . INFLUENZA VACCINE  09/13/2017  . HEMOGLOBIN A1C  09/23/2017  . PAP SMEAR  02/09/2018  . OPHTHALMOLOGY EXAM  02/14/2018  . MAMMOGRAM  06/21/2019  . PNEUMOCOCCAL POLYSACCHARIDE VACCINE (2) 10/05/2020  . TETANUS/TDAP  03/22/2026  . HIV Screening  Completed    Immunization History  Administered Date(s) Administered  . Influenza Split 11/07/2011  . Influenza,inj,Quad PF,6+ Mos 11/10/2013, 11/20/2014, 10/06/2015  . Pneumococcal Polysaccharide-23 10/06/2015  . Td 02/14/2004  . Tdap 03/22/2016   Patient Active Problem List   Diagnosis Date Noted  . Thyrotoxicosis without thyroid storm 11/09/2016  . Multinodular goiter 11/09/2016  . Diabetes mellitus type 2 in obese ([place]) 04/14/2015  . Essential hypertension 12/31/2008  . ALLERGIC RHINITIS 12/31/2008   Past Medical History:  Diagnosis Date  . Allergy   . Diabetes mellitus type 2 in obese ([place]) 04/14/2015  . Hypertension   . Hypothyroid   . Positive PPD, treated    9 months of treatment   Past Surgical History:  Procedure Laterality Date  .  CHOLECYSTECTOMY  1999  . TONSILLECTOMY  1995   Social History   Socioeconomic History  . Marital status: Married    Spouse name: Not on file  . Number of children: Not on file  . Years of education: Not on file  . Highest education level: Not on file  Occupational History  . 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 Financial resource strain: Not on file  . Food insecurity:    Worry: Not on file    Inability: Not on file  . Transportation needs:    Medical: Not on file    Non-medical: Not on file  Tobacco Use  . Smoking status: Never Smoker  . Smokeless tobacco: Never Used  Substance and Sexual Activity  . Alcohol use: No  . Drug use: No  . Sexual activity: Not Currently    Partners: Male    Birth control/protection: OCP, Other-see comments    Comment: husband had vasectomy  Lifestyle  . Physical activity:    Days per week: Not on file    Minutes per session: Not on file  . Stress: Not on file  Relationships  . Social connections:    Talks on phone: Not on file    Gets together: Not on file    Attends religious service: Not on file    Active member of club or organization: Not on file    Attends meetings of clubs or organizations: Not on file    Relationship status: Not on file  .  Intimate partner violence:    Fear of current or ex partner: Not on file    Emotionally abused: Not on file    Physically abused: Not on file    Forced sexual activity: Not on file  Other Topics Concern  . Not on file  Social History Narrative  . Not on file   Family History  Problem Relation Age of Onset  . Stroke Other   . Diabetes Other   . Cancer Mother   . Heart disease Mother   . Alcohol abuse Neg Hx   . Mental illness Neg Hx    Allergies  Allergen Reactions  . Penicillins     REACTION: rash  . Ace Inhibitors Cough  . Clarithromycin Nausea Only    REACTION: Extreme nausea  .  Diclofenac Nausea Only    Medication list has been reviewed and updated.   General: Denies fever, chills, sweats. No significant weight loss. Eyes: Denies blurring,significant itching ENT: Denies earache, sore throat, and hoarseness.  Cardiovascular: Denies chest pains, palpitations, dyspnea on exertion,  Respiratory: Denies cough, dyspnea at rest,wheeezing Breast: no concerns about lumps GI: Denies nausea, vomiting, diarrhea, constipation, change in bowel habits, abdominal pain, melena, hematochezia GU: Denies dysuria, hematuria, urinary hesitancy, nocturia, denies STD risk, no concerns about discharge Musculoskeletal: Denies back pain, joint pain Derm: Denies rash, itching Neuro: Denies  paresthesias, frequent falls, frequent headaches Psych: Denies depression, anxiety Endocrine: Denies cold intolerance, heat intolerance, polydipsia Heme: Denies enlarged lymph nodes Allergy: No hayfever  Objective:   LMP 08/30/2017  No exam data present  GEN: well developed, well nourished, no acute distress Eyes: conjunctiva and lids normal, PERRLA, EOMI ENT: TM clear, nares clear, oral exam WNL Neck: supple, no lymphadenopathy, no thyromegaly, no JVD Pulm: clear to auscultation and percussion, respiratory effort normal CV: regular rate and rhythm, S1-S2, no murmur, rub or gallop, no bruits Chest: no scars, masses, no lumps BREAST: breast exam declined GI: soft, non-tender; no hepatosplenomegaly, masses; active bowel sounds all quadrants GU: GU exam declined Lymph: no cervical, axillary or inguinal adenopathy MSK: gait normal, muscle tone and strength WNL, no joint swelling, effusions, discoloration, crepitus  SKIN: clear, good turgor, color WNL, no rashes, lesions, or ulcerations Neuro: normal mental status, normal strength, sensation, and motion Psych: alert; oriented to person, place and time, normally interactive and not anxious or depressed in appearance.   All labs reviewed with  patient. Lipids:    Component Value Date/Time   CHOL 195 09/18/2016 1117   TRIG 68 09/18/2016 1117   HDL 62 09/18/2016 1117   LDLDIRECT 145.7 05/05/2009 1509   VLDL 14 09/18/2016 1117   CHOLHDL 3.1 09/18/2016 1117   CBC: CBC Latest Ref Rng & Units 09/18/2016 03/29/2015 10/23/2011  WBC 3.8 - 10.8 K/uL 5.2 5.5 7.7  Hemoglobin 11.7 - 15.5 g/dL 12.4 13.2 13.1  Hematocrit 35.0 - 45.0 % 39.6 41.2 41.3  Platelets 140 - 400 K/uL 319 299.0 449.6    Basic Metabolic Panel:    Component Value Date/Time   NA 136 09/18/2016 1117   K 3.9 09/18/2016 1117   CL 105 09/18/2016 1117   CO2 18 (L) 09/18/2016 1117   BUN 8 09/18/2016 1117   CREATININE 0.79 09/18/2016 1117   GLUCOSE 118 (H) 09/18/2016 1117   CALCIUM 9.0 09/18/2016 1117   Hepatic Function Latest Ref Rng & Units 09/18/2016 03/29/2015 10/23/2011  Total Protein 6.1 - 8.1 g/dL 7.6 7.4 7.5  Albumin 3.6 - 5.1 g/dL 4.1 4.0 3.7  AST 10 - 35 U/L _0 ALT 6 - 29 U/L _1 Alk Phosphatase 33 - 130 U/L 69 91 65  Total Bilirubin 0.2 - 1.2 mg/dL 0.5 0.4 0.5  Bilirubin, Direct <=0.2 mg/dL 0.1 0.1 0.1    Lab Results  Component Value Date   TSH 0.08 (L) 07/10/2017   No results found.  Assessment and Plan:   Healthcare maintenance  Health Maintenance Exam: The patient's preventative maintenance and recommended screening tests for an annual wellness exam were reviewed in full today. Brought up to date unless services declined.  Counselled on the importance of diet, exercise, and its role in overall health and mortality. The patient's FH and SH was reviewed, including their home life, tobacco status, and drug and alcohol status.  Follow-up in 1 year for physical exam or additional follow-up below.  Follow-up: No follow-ups on file. Or follow-up in 1 year if not noted.  Signed,  Maud Deed. Ashlye Oviedo, MD   Allergies as of 09/24/2017      Reactions   Penicillins    REACTION: rash   Ace Inhibitors Cough   Clarithromycin Nausea Only    REACTION: Extreme nausea   Diclofenac Nausea Only      Medication List        Accurate as of 09/23/17  9:55 AM. Always use your most recent med list.          atorvastatin 20 MG tablet Commonly known as:  LIPITOR Take 1 tablet (20 mg total) by mouth daily.   losartan 50 MG tablet Commonly known as:  COZAAR Take 1 tablet (50 mg total) by mouth daily.   metFORMIN 500 MG 24 hr tablet Commonly known as:  GLUCOPHAGE-XR TAKE 1 TABLET(500 MG) BY MOUTH DAILY WITH BREAKFAST   multivitamin tablet Take 1 tablet by mouth daily.   naproxen 500 MG tablet Commonly known as:  NAPROSYN Take one po bid x 5 day then prn pain, take with food   neomycin-polymyxin-hydrocortisone OTIC solution Commonly known as:  CORTISPORIN Place 3 drops into both ears 3 (three) times daily.   ondansetron 4 MG disintegrating tablet Commonly known as:  ZOFRAN-ODT Take 1 tablet (4 mg total) by mouth every 6 (six) hours as needed for nausea.   scopolamine 1 MG/3DAYS Commonly known as:  TRANSDERM-SCOP Place 1 patch (1.5 mg total) onto the skin every 3 (three) days.

## 2017-09-24 ENCOUNTER — Encounter: Payer: 59 | Admitting: Family Medicine

## 2017-10-28 NOTE — Progress Notes (Signed)
Dr. Frederico Hamman T. Yanin Muhlestein, MD, [facility] Primary [facility] Sports Medicine [place], [address] Phone: [phone] Fax: [phone]  10/29/2017  Patient: Carrie Ellis, MRN: [id number], DOB: 26-Nov-1965, 52 y.o.  Primary Physician:  Owens Loffler, MD   Chief Complaint  Patient presents with  . Annual Exam    left wrist pain, no pap   Subjective:   Carrie Ellis is a 52 y.o. pleasant patient who presents with the following:  Health Maintenance Summary Reviewed and updated, unless pt declines services.  Tobacco History Reviewed. Non-smoker Alcohol: No concerns, no excessive use Exercise Habits: none.  STD concerns: none Drug Use: None Menses regular: periods off and on.  Lumps or breast concerns: no Breast Cancer Family History: no  Wt Readings from Last 3 Encounters:  10/29/17 261 lb 12 oz (118.7 kg)  09/03/17 267 lb (121.1 kg)  07/10/17 267 lb (121.1 kg)    Lost 6 pounds.   BS - last saturday, 129 in the morning.   Colon, not now. Mom has stage 4 cancer. Kidney cancer and now with met to her heart.  Flu - given Needs all labs and DM labs Mammo - 06/2017  Work is going OK.  Mom is in [place].   Health Maintenance  Topic Date Due  . FOOT EXAM  08/31/1975  . COLONOSCOPY  08/31/2015  . INFLUENZA VACCINE  09/13/2017  . HEMOGLOBIN A1C  09/23/2017  . PAP SMEAR  02/09/2018  . OPHTHALMOLOGY EXAM  02/14/2018  . MAMMOGRAM  06/21/2019  . TETANUS/TDAP  03/22/2026  . PNEUMOCOCCAL POLYSACCHARIDE VACCINE AGE 61-64 HIGH RISK  Completed  . HIV Screening  Completed    Immunization History  Administered Date(s) Administered  . Influenza Split 11/07/2011  . Influenza,inj,Quad PF,6+ Mos 11/10/2013, 11/20/2014, 10/06/2015  . Pneumococcal Polysaccharide-23 10/06/2015  . Td 02/14/2004  . Tdap 03/22/2016   Patient Active Problem List   Diagnosis Date Noted  . Thyrotoxicosis without thyroid storm 11/09/2016  . Multinodular  goiter 11/09/2016  . Diabetes mellitus type 2 in obese ([facility]) 04/14/2015  . Essential hypertension 12/31/2008  . ALLERGIC RHINITIS 12/31/2008   Past Medical History:  Diagnosis Date  . Allergy   . Diabetes mellitus type 2 in obese ([facility]) 04/14/2015  . Hypertension   . Hypothyroid   . Positive PPD, treated    9 months of treatment   Past Surgical History:  Procedure Laterality Date  . CHOLECYSTECTOMY  1999  . TONSILLECTOMY  1995   Social History   Socioeconomic History  . Marital status: Married    Spouse name: Not on file  . Number of children: Not on file  . Years of education: Not on file  . Highest education level: Not on file  Occupational History  . 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 Financial resource strain: Not on file  . Food insecurity:    Worry: Not on file    Inability: Not on file  . Transportation needs:    Medical: Not on file    Non-medical: Not on file  Tobacco Use  . Smoking status: Never Smoker  . Smokeless tobacco: Never Used  Substance and Sexual Activity  . Alcohol use: No  . Drug use: No  . Sexual activity: Not Currently    Partners: Male    Birth control/protection: OCP, Other-see comments    Comment: husband had vasectomy  Lifestyle  . Physical activity:    Days per week: Not on file    Minutes per session: Not on file  . Stress: Not on file  Relationships  . Social connections:    Talks on phone: Not on file    Gets together: Not on file    Attends religious service: Not on file    Active member of club or organization: Not on file    Attends meetings of clubs or organizations: Not on file    Relationship status: Not on file  . Intimate partner violence:    Fear of current or ex partner: Not on file    Emotionally abused: Not on file    Physically abused: Not on file    Forced sexual activity: Not on file  Other Topics Concern  . Not on  file  Social History Narrative  . Not on file   Family History  Problem Relation Age of Onset  . Stroke Other   . Diabetes Other   . Cancer Mother   . Heart disease Mother   . Alcohol abuse Neg Hx   . Mental illness Neg Hx    Allergies  Allergen Reactions  . Penicillins     REACTION: rash  . Ace Inhibitors Cough  . Clarithromycin Nausea Only    REACTION: Extreme nausea  . Diclofenac Nausea Only    Medication list has been reviewed and updated.   General: Denies fever, chills, sweats. No significant weight loss. Eyes: Denies blurring,significant itching ENT: Denies earache, sore throat, and hoarseness.  Cardiovascular: Denies chest pains, palpitations, dyspnea on exertion,  Respiratory: Denies cough, dyspnea at rest,wheeezing Breast: no concerns about lumps GI: Denies nausea, vomiting, diarrhea, constipation, change in bowel habits, abdominal pain, melena, hematochezia GU: Denies dysuria, hematuria, urinary hesitancy, nocturia, denies STD risk, no concerns about discharge Musculoskeletal: Denies back pain, joint pain Derm: Denies rash, itching Neuro: Denies  paresthesias, frequent falls, frequent headaches Psych: stress as above Endocrine: Denies cold intolerance, heat intolerance, polydipsia Heme: Denies enlarged lymph nodes Allergy: No hayfever  Objective:   BP 140/80   Pulse 92   Temp 98.3 F (36.8 C) (Oral)   Ht 5' 11.5" (1.816 m)   Wt 261 lb 12 oz (118.7 kg)   BMI 36.00 kg/m  No exam data present  GEN: well developed, well nourished, no acute distress Eyes: conjunctiva and lids normal, PERRLA, EOMI ENT: TM clear, nares clear, oral exam WNL Neck: supple, no lymphadenopathy, no thyromegaly, no JVD Pulm: clear to auscultation and percussion, respiratory effort normal CV: regular rate and rhythm, S1-S2, no murmur, rub or gallop, no bruits Chest: no scars, masses, no lumps BREAST: breast exam declined GI: soft, non-tender; no hepatosplenomegaly, masses;  active bowel sounds all quadrants GU: GU exam declined Lymph: no cervical, axillary or inguinal adenopathy MSK: gait normal, muscle tone and strength WNL, no joint swelling, effusions, discoloration, crepitus   L 3rd digit with some pain along tendon extension  SKIN: clear, good turgor, color WNL, no rashes, lesions, or ulcerations Neuro: normal mental status, normal strength, sensation, and motion Psych: alert; oriented to person, place and time, normally interactive and not anxious or depressed in appearance.   All labs reviewed with patient. Lipids:    Component Value Date/Time   CHOL 195 09/18/2016 1117   TRIG 68 09/18/2016 1117   HDL 62 09/18/2016 1117   LDLDIRECT 145.7 05/05/2009 1509   VLDL 14 09/18/2016 1117   CHOLHDL 3.1 09/18/2016 1117   CBC:  CBC Latest Ref Rng & Units 09/18/2016 03/29/2015 10/23/2011  WBC 3.8 - 10.8 K/uL 5.2 5.5 7.7  Hemoglobin 11.7 - 15.5 g/dL 12.4 13.2 13.1  Hematocrit 35.0 - 45.0 % 39.6 41.2 41.3  Platelets 140 - 400 K/uL 319 299.0 381.8    Basic Metabolic Panel:    Component Value Date/Time   NA 136 09/18/2016 1117   K 3.9 09/18/2016 1117   CL 105 09/18/2016 1117   CO2 18 (L) 09/18/2016 1117   BUN 8 09/18/2016 1117   CREATININE 0.79 09/18/2016 1117   GLUCOSE 118 (H) 09/18/2016 1117   CALCIUM 9.0 09/18/2016 1117   Hepatic Function Latest Ref Rng & Units 09/18/2016 03/29/2015 10/23/2011  Total Protein 6.1 - 8.1 g/dL 7.6 7.4 7.5  Albumin 3.6 - 5.1 g/dL 4.1 4.0 3.7  AST 10 - 35 U/L 13 13 14   ALT 6 - 29 U/L 11 11 17   Alk Phosphatase 33 - 130 U/L 69 91 65  Total Bilirubin 0.2 - 1.2 mg/dL 0.5 0.4 0.5  Bilirubin, Direct <=0.2 mg/dL 0.1 0.1 0.1    Lab Results  Component Value Date   TSH 0.08 (L) 07/10/2017   No results found.  Assessment and Plan:   Healthcare maintenance   Current labs are pending  Buddy tape 3 and 4 on the L, nsaids the next 10 d  Health Maintenance Exam: The patient's preventative maintenance and recommended screening  tests for an annual wellness exam were reviewed in full today. Brought up to date unless services declined.  Counselled on the importance of diet, exercise, and its role in overall health and mortality. The patient's FH and SH was reviewed, including their home life, tobacco status, and drug and alcohol status.  Follow-up in 1 year for physical exam or additional follow-up below.  Follow-up: No follow-ups on file. Or follow-up in 1 year if not noted.  Signed,  Maud Deed. Kacper Cartlidge, MD   Allergies as of 10/29/2017      Reactions   Penicillins    REACTION: rash   Ace Inhibitors Cough   Clarithromycin Nausea Only   REACTION: Extreme nausea   Diclofenac Nausea Only      Medication List        Accurate as of 10/29/17  9:26 AM. Always use your most recent med list.          atorvastatin 20 MG tablet Commonly known as:  LIPITOR Take 1 tablet (20 mg total) by mouth daily.   losartan 50 MG tablet Commonly known as:  COZAAR Take 1 tablet (50 mg total) by mouth daily.   metFORMIN 500 MG 24 hr tablet Commonly known as:  GLUCOPHAGE-XR TAKE 1 TABLET(500 MG) BY MOUTH DAILY WITH BREAKFAST   multivitamin tablet Take 1 tablet by mouth daily.   ondansetron 4 MG disintegrating tablet Commonly known as:  ZOFRAN-ODT Take 1 tablet (4 mg total) by mouth every 6 (six) hours as needed for nausea.

## 2017-10-29 ENCOUNTER — Encounter: Payer: Self-pay | Admitting: Family Medicine

## 2017-10-29 ENCOUNTER — Ambulatory Visit (INDEPENDENT_AMBULATORY_CARE_PROVIDER_SITE_OTHER): Payer: 59 | Admitting: Family Medicine

## 2017-10-29 VITALS — BP 140/80 | HR 92 | Temp 98.3°F | Ht 71.5 in | Wt 261.8 lb

## 2017-10-29 DIAGNOSIS — Z0000 Encounter for general adult medical examination without abnormal findings: Secondary | ICD-10-CM

## 2017-10-29 DIAGNOSIS — E1169 Type 2 diabetes mellitus with other specified complication: Secondary | ICD-10-CM | POA: Diagnosis not present

## 2017-10-29 DIAGNOSIS — E669 Obesity, unspecified: Secondary | ICD-10-CM

## 2017-10-29 DIAGNOSIS — Z23 Encounter for immunization: Secondary | ICD-10-CM | POA: Diagnosis not present

## 2017-10-29 LAB — CBC WITH DIFFERENTIAL/PLATELET
BASOS ABS: 0 10*3/uL (ref 0.0–0.1)
Basophils Relative: 0.6 % (ref 0.0–3.0)
EOS PCT: 2 % (ref 0.0–5.0)
Eosinophils Absolute: 0.1 10*3/uL (ref 0.0–0.7)
HCT: 35.6 % — ABNORMAL LOW (ref 36.0–46.0)
Hemoglobin: 11.2 g/dL — ABNORMAL LOW (ref 12.0–15.0)
LYMPHS ABS: 2.5 10*3/uL (ref 0.7–4.0)
Lymphocytes Relative: 43.7 % (ref 12.0–46.0)
MCHC: 31.4 g/dL (ref 30.0–36.0)
MCV: 70.9 fl — AB (ref 78.0–100.0)
MONO ABS: 0.5 10*3/uL (ref 0.1–1.0)
MONOS PCT: 8.2 % (ref 3.0–12.0)
NEUTROS PCT: 45.5 % (ref 43.0–77.0)
Neutro Abs: 2.7 10*3/uL (ref 1.4–7.7)
Platelets: 265 10*3/uL (ref 150.0–400.0)
RBC: 5.03 Mil/uL (ref 3.87–5.11)
RDW: 15.6 % — ABNORMAL HIGH (ref 11.5–15.5)
WBC: 5.8 10*3/uL (ref 4.0–10.5)

## 2017-10-29 LAB — BASIC METABOLIC PANEL
BUN: 8 mg/dL (ref 6–23)
CALCIUM: 9.2 mg/dL (ref 8.4–10.5)
CO2: 30 meq/L (ref 19–32)
Chloride: 105 mEq/L (ref 96–112)
Creatinine, Ser: 0.78 mg/dL (ref 0.40–1.20)
GFR: 99.68 mL/min (ref >60.00)
GLUCOSE: 113 mg/dL — AB (ref 70–99)
Potassium: 3.9 mEq/L (ref 3.5–5.1)
SODIUM: 139 meq/L (ref 135–145)

## 2017-10-29 LAB — LIPID PANEL
CHOL/HDL RATIO: 3
Cholesterol: 141 mg/dL (ref 0–200)
HDL: 53.9 mg/dL (ref >39.00)
LDL CALC: 74 mg/dL (ref 0–99)
NONHDL: 87.25
TRIGLYCERIDES: 64 mg/dL (ref 0.0–149.0)
VLDL: 12.8 mg/dL (ref 0.0–40.0)

## 2017-10-29 LAB — HEPATIC FUNCTION PANEL
ALBUMIN: 4 g/dL (ref 3.5–5.2)
ALT: 13 U/L (ref 0–35)
AST: 12 U/L (ref 0–37)
Alkaline Phosphatase: 76 U/L (ref 39–117)
BILIRUBIN TOTAL: 0.5 mg/dL (ref 0.2–1.2)
Bilirubin, Direct: 0.1 mg/dL (ref 0.0–0.3)
Total Protein: 7.3 g/dL (ref 6.0–8.3)

## 2017-10-29 LAB — MICROALBUMIN / CREATININE URINE RATIO
Creatinine,U: 101 mg/dL
MICROALB UR: 13.5 mg/dL — AB (ref 0.0–1.9)
Microalb Creat Ratio: 13.4 mg/g (ref 0.0–30.0)

## 2017-10-29 LAB — HEMOGLOBIN A1C: HEMOGLOBIN A1C: 7.1 % — AB (ref 4.6–6.5)

## 2017-10-29 MED ORDER — ATORVASTATIN CALCIUM 20 MG PO TABS: 20.0000 mg | ORAL_TABLET | Freq: Every day | ORAL | 3 refills | Status: DC

## 2017-11-01 DIAGNOSIS — M5412 Radiculopathy, cervical region: Secondary | ICD-10-CM | POA: Diagnosis not present

## 2017-11-01 DIAGNOSIS — M25532 Pain in left wrist: Secondary | ICD-10-CM | POA: Diagnosis not present

## 2017-12-26 ENCOUNTER — Encounter: Payer: Self-pay | Admitting: Radiology

## 2017-12-28 ENCOUNTER — Encounter: Payer: Self-pay | Admitting: Family Medicine

## 2017-12-28 ENCOUNTER — Ambulatory Visit (INDEPENDENT_AMBULATORY_CARE_PROVIDER_SITE_OTHER): Payer: 59 | Admitting: Family Medicine

## 2017-12-28 VITALS — BP 132/84 | HR 100 | Temp 98.5°F | Ht 71.5 in | Wt 253.0 lb

## 2017-12-28 DIAGNOSIS — R112 Nausea with vomiting, unspecified: Secondary | ICD-10-CM

## 2017-12-28 DIAGNOSIS — R197 Diarrhea, unspecified: Secondary | ICD-10-CM | POA: Diagnosis not present

## 2017-12-28 DIAGNOSIS — R110 Nausea: Secondary | ICD-10-CM | POA: Diagnosis not present

## 2017-12-28 DIAGNOSIS — F430 Acute stress reaction: Secondary | ICD-10-CM

## 2017-12-28 LAB — CBC WITH DIFFERENTIAL/PLATELET
BASOS ABS: 0 10*3/uL (ref 0.0–0.1)
Basophils Relative: 0.4 % (ref 0.0–3.0)
Eosinophils Absolute: 0 10*3/uL (ref 0.0–0.7)
Eosinophils Relative: 0.6 % (ref 0.0–5.0)
HCT: 36.7 % (ref 36.0–46.0)
Hemoglobin: 11.5 g/dL — ABNORMAL LOW (ref 12.0–15.0)
LYMPHS ABS: 1.7 10*3/uL (ref 0.7–4.0)
LYMPHS PCT: 31.8 % (ref 12.0–46.0)
MCHC: 31.4 g/dL (ref 30.0–36.0)
MCV: 71.2 fl — ABNORMAL LOW (ref 78.0–100.0)
Monocytes Absolute: 0.3 10*3/uL (ref 0.1–1.0)
Monocytes Relative: 5.7 % (ref 3.0–12.0)
NEUTROS ABS: 3.2 10*3/uL (ref 1.4–7.7)
Neutrophils Relative %: 61.5 % (ref 43.0–77.0)
PLATELETS: 258 10*3/uL (ref 150.0–400.0)
RBC: 5.16 Mil/uL — ABNORMAL HIGH (ref 3.87–5.11)
RDW: 16.4 % — ABNORMAL HIGH (ref 11.5–15.5)
WBC: 5.3 10*3/uL (ref 4.0–10.5)

## 2017-12-28 LAB — COMPREHENSIVE METABOLIC PANEL
ALT: 8 U/L (ref 0–35)
AST: 8 U/L (ref 0–37)
Albumin: 4.1 g/dL (ref 3.5–5.2)
Alkaline Phosphatase: 73 U/L (ref 39–117)
BILIRUBIN TOTAL: 0.5 mg/dL (ref 0.2–1.2)
BUN: 8 mg/dL (ref 6–23)
CO2: 26 meq/L (ref 19–32)
Calcium: 8.8 mg/dL (ref 8.4–10.5)
Chloride: 105 mEq/L (ref 96–112)
Creatinine, Ser: 0.85 mg/dL (ref 0.40–1.20)
GFR: 90.21 mL/min (ref >60.00)
Glucose, Bld: 186 mg/dL — ABNORMAL HIGH (ref 70–99)
Potassium: 3.4 mEq/L — ABNORMAL LOW (ref 3.5–5.1)
Sodium: 139 mEq/L (ref 135–145)
Total Protein: 7.4 g/dL (ref 6.0–8.3)

## 2017-12-28 MED ORDER — ONDANSETRON 4 MG PO TBDP: 4.0000 mg | ORAL_TABLET | Freq: Four times a day (QID) | ORAL | 0 refills | Status: DC | PRN

## 2017-12-28 NOTE — Patient Instructions (Signed)
Sip fluids - constantly to prevent dehydration   Stick to a very bland diet when you feel like eating  BRAT -- bananas /rice [place]/apple sauce Caryl Comes/toast   Hold metformin if you are not eating   Lab today  Stool studies as well   I suspect this may be viral based on your exam  Use the generic zofran for nausea as needed- using caution of sedation   Update if not starting to improve in a week or if worsening

## 2017-12-28 NOTE — Progress Notes (Signed)
Subjective:    Patient ID: Carrie Ellis, female    DOB: Jan 20, 1966, 52 y.o.   MRN: [id number]  HPI  52 yo pt of Dr Patsy Lager here for n/v/d since Monday (? Recurrent)  Had a bout on oct 21 It lasted 3 days (that was a bit worse)   No abx lately   Then this episode started on Monday  Loose stool  Then n/v - prod mucous (not a lot from her stomach)  No blood in stool  Over 10 bm per day  Loose- not watery  occ cramping at work   One night - vomited from 2 to 8 pm - (some dry heaves)  Tried zofran- (it makes her sleepy) but it helps   No fever (one time had a sweat)  No abdominal pain   No vomiting today  Still feels nauseated  Appetite is low  Eating bland things  Drinking ginger ale and had chicken noodle soup and scrambled eggs   Last meal - hamburger/squash   No sick contacts that she knows of   Had ccy in the past    Severely stressed- caring for elderly sick mother in [place]  Not sleeping well at night (mind is racing)     Wt Readings from Last 3 Encounters:  12/28/17 253 lb (114.8 kg)  10/29/17 261 lb 12 oz (118.7 kg)  09/03/17 267 lb (121.1 kg)   34.79 kg/m    Temp: 98.5 F (36.9 C)  Pulse Readings from Last 3 Encounters:  12/28/17 100  10/29/17 92  09/03/17 92   BP: 132/84   Patient Active Problem List   Diagnosis Date Noted  . Nausea vomiting and diarrhea 12/28/2017  . Thyrotoxicosis without thyroid storm 11/09/2016  . Multinodular goiter 11/09/2016  . Diabetes mellitus type 2 in obese (HCC) 04/14/2015  . Essential hypertension 12/31/2008  . ALLERGIC RHINITIS 12/31/2008   Past Medical History:  Diagnosis Date  . Allergy   . Diabetes mellitus type 2 in obese (HCC) 04/14/2015  . Hypertension   . Hypothyroid   . Positive PPD, treated    9 months of treatment   Past Surgical History:  Procedure Laterality Date  . CHOLECYSTECTOMY  1999  . TONSILLECTOMY  1995   Social History   Tobacco Use  . Smoking status: Never  Smoker  . Smokeless tobacco: Never Used  Substance Use Topics  . Alcohol use: No  . Drug use: No   Family History  Problem Relation Age of Onset  . Stroke Other   . Diabetes Other   . Cancer Mother   . Heart disease Mother   . Alcohol abuse Neg Hx   . Mental illness Neg Hx    Allergies  Allergen Reactions  . Penicillins     REACTION: rash  . Ace Inhibitors Cough  . Clarithromycin Nausea Only    REACTION: Extreme nausea  . Diclofenac Nausea Only   Current Outpatient Medications on File Prior to Visit  Medication Sig Dispense Refill  . atorvastatin (LIPITOR) 20 MG tablet Take 1 tablet (20 mg total) by mouth daily. 90 tablet 3  . losartan (COZAAR) 50 MG tablet Take 1 tablet (50 mg total) by mouth daily. 90 tablet 3  . metFORMIN (GLUCOPHAGE-XR) 500 MG 24 hr tablet TAKE 1 TABLET(500 MG) BY MOUTH DAILY WITH BREAKFAST 90 tablet 3  . Multiple Vitamin (MULTIVITAMIN) tablet Take 1 tablet by mouth daily.       No current facility-administered medications on file  prior to visit.     Review of Systems  Constitutional: Positive for fatigue. Negative for activity change, appetite change, fever and unexpected weight change.  HENT: Negative for congestion, ear pain, rhinorrhea, sinus pressure and sore throat.   Eyes: Negative for pain, redness and visual disturbance.  Respiratory: Negative for cough, shortness of breath and wheezing.   Cardiovascular: Negative for chest pain and palpitations.  Gastrointestinal: Positive for diarrhea, nausea and vomiting. Negative for abdominal distention, abdominal pain, anal bleeding, blood in stool, constipation and rectal pain.  Endocrine: Negative for polydipsia and polyuria.  Genitourinary: Negative for dysuria, frequency and urgency.  Musculoskeletal: Negative for arthralgias, back pain and myalgias.  Skin: Negative for pallor and rash.  Allergic/Immunologic: Negative for environmental allergies.  Neurological: Negative for dizziness, syncope and  headaches.  Hematological: Negative for adenopathy. Does not bruise/bleed easily.  Psychiatric/Behavioral: Negative for decreased concentration and dysphoric mood. The patient is nervous/anxious.        Severe stressors       Objective:   Physical Exam  Constitutional: She appears well-developed and well-nourished. No distress.  obese and well appearing  Seems stressed and generally fatigued   HENT:  Head: Normocephalic and atraumatic.  Mouth/Throat: Oropharynx is clear and moist.  Eyes: Pupils are equal, round, and reactive to light. Conjunctivae and EOM are normal. No scleral icterus.  Neck: Normal range of motion. Neck supple.  Cardiovascular: Normal rate, regular rhythm and normal heart sounds.  Pulmonary/Chest: Effort normal and breath sounds normal. No stridor. No respiratory distress. She has no wheezes. She has no rales.  Abdominal: Soft. Bowel sounds are normal. She exhibits no distension, no ascites, no pulsatile midline mass and no mass. There is no tenderness. There is no rebound and no guarding.  Nl bs 4 Q No bloating or mass No tenderness  Lymphadenopathy:    She has no cervical adenopathy.  Neurological: She is alert.  Skin: Skin is warm and dry. No erythema. No pallor.  Psychiatric: Her mood appears anxious.  Anxious and fatigued Talks candidly about her social situation- sick mother with cancer in [place] and no help caring for her           Assessment & Plan:   Problem List Items Addressed This Visit      Digestive   Nausea vomiting and diarrhea - Primary    Suspect viral- but pt did have a bout in October as well  Severely stressed and tired - ? If playing a role No s/s of peptic ulcer   Disc imp of rehydration/handout given  Lab today given length of symptoms  Also stool tests for c diff and cx  S/s of dehydration rev rec ED visit if suddenly worse /dehydrated or abd pain  Update if not starting to improve in a week or if worsening          Relevant Orders   CBC with Differential/Platelet (Completed)   Comprehensive metabolic panel (Completed)   C. difficile GDH and Toxin A/B   Stool culture     Other   Stress reaction    Bad social situation- pt caring for mother (terminal ca) in [place] w/o help  Urged her to reach out to social workers  She herself needs rest and self care est when sick       Other Visit Diagnoses    Nausea       Relevant Medications   ondansetron (ZOFRAN ODT) 4 MG disintegrating tablet

## 2017-12-29 DIAGNOSIS — F430 Acute stress reaction: Secondary | ICD-10-CM | POA: Insufficient documentation

## 2017-12-29 NOTE — Assessment & Plan Note (Signed)
Bad social situation- pt caring for mother (terminal ca) in [place] w/o help  Urged her to reach out to social workers  She herself needs rest and self care est when sick

## 2017-12-29 NOTE — Assessment & Plan Note (Signed)
Suspect viral- but pt did have a bout in October as well  Severely stressed and tired - ? If playing a role No s/s of peptic ulcer   Disc imp of rehydration/handout given  Lab today given length of symptoms  Also stool tests for c diff and cx  S/s of dehydration rev rec ED visit if suddenly worse /dehydrated or abd pain  Update if not starting to improve in a week or if worsening

## 2018-01-01 ENCOUNTER — Telehealth: Payer: Self-pay | Admitting: *Deleted

## 2018-01-01 LAB — STOOL CULTURE
MICRO NUMBER: 91378961
MICRO NUMBER:: 91378959
MICRO NUMBER:: 91378962
SHIGA RESULT:: NOT DETECTED
SPECIMEN QUALITY: ADEQUATE
SPECIMEN QUALITY:: ADEQUATE
SPECIMEN QUALITY:: ADEQUATE

## 2018-01-01 LAB — C. DIFFICILE GDH AND TOXIN A/B
GDH ANTIGEN: NOT DETECTED
MICRO NUMBER: 91378960
SPECIMEN QUALITY:: ADEQUATE
TOXIN A AND B: NOT DETECTED

## 2018-01-01 NOTE — Telephone Encounter (Signed)
Left VM requesting pt to call the office back regarding lab results  

## 2018-01-11 ENCOUNTER — Ambulatory Visit: Payer: 59 | Admitting: Internal Medicine

## 2018-01-31 ENCOUNTER — Ambulatory Visit (INDEPENDENT_AMBULATORY_CARE_PROVIDER_SITE_OTHER): Payer: 59 | Admitting: Internal Medicine

## 2018-01-31 ENCOUNTER — Encounter: Payer: Self-pay | Admitting: Internal Medicine

## 2018-01-31 VITALS — BP 132/84 | HR 97 | Ht 72.0 in | Wt 248.6 lb

## 2018-01-31 DIAGNOSIS — E042 Nontoxic multinodular goiter: Secondary | ICD-10-CM | POA: Diagnosis not present

## 2018-01-31 DIAGNOSIS — E0590 Thyrotoxicosis, unspecified without thyrotoxic crisis or storm: Secondary | ICD-10-CM | POA: Diagnosis not present

## 2018-01-31 LAB — T3, FREE: T3, Free: 4.3 pg/mL — ABNORMAL HIGH (ref 2.3–4.2)

## 2018-01-31 LAB — T4, FREE: Free T4: 1.17 ng/dL (ref 0.60–1.60)

## 2018-01-31 LAB — TSH: TSH: <0.01 u[IU]/mL — ABNORMAL LOW (ref 0.35–4.50)

## 2018-01-31 NOTE — Patient Instructions (Signed)
Please stop at the lab.  Please come back for a follow-up appointment in 6 months.  

## 2018-01-31 NOTE — Progress Notes (Signed)
Patient ID: Carrie Ellis, female   DOB: 1965-09-03, 52 y.o.   MRN: [id number]    HPI  Carrie Ellis is a 52 y.o.-year-old female, returning for follow-up for multiple thyroid nodules and thyrotoxicosis.  Last visit 7 months ago.  She started to exercised more since last visit. She also adjusted her diet. Her mother is sick >> has been busy with her.   Reviewed history: Pt has had a low TSH since 1990s>> saw Dr. Margaretmary Bayley >> was on medications >> then TFTs normalized >> stopped the medication (cannot remember name).   After this, she started to see Dr. Dallas Schimke and her TFTs started to become abnormal again in 2017.  Reviewed patient's TFTs-slightly better at last visit: Lab Results  Component Value Date   TSH 0.08 (L) 07/10/2017   TSH 0.05 (L) 03/12/2017   TSH 0.03 (L) 11/09/2016   TSH 0.03 (L) 09/18/2016   TSH 0.03 (L) 04/14/2015   TSH 0.07 (L) 03/29/2015   TSH 0.38 10/23/2011   TSH 0.29 (L) 05/05/2009   FREET4 0.87 07/10/2017   FREET4 1.00 03/12/2017   FREET4 1.00 11/09/2016   FREET4 1.6 09/18/2016   FREET4 1.14 04/14/2015   FREET4 1.0 05/05/2009  2011: TSH 0.29, normal free hormones  TSI's were not high: Lab Results  Component Value Date   TSI <89 11/09/2016   Thyroid ultrasound (04/20/2006 compared with 05/13/2004) showed multinodular goiter with slight interval increase in size of the right inferior thyroid nodule and left inferior thyroid nodule, but otherwise stable size of the nodules, with slightly enlarged thyroid.  At last visit, we checked a thyroid uptake and scan (11/28/2016): She had toxic bilateral thyroid adenomas. Hyper functional BILATERAL thyroid nodules with suppression of uptake in remaining thyroid tissue compatible with hyper functional thyroid adenomas. 24 hour radio iodine uptake of 26%.  After the results returned, I sent patient a message through my chart, recommending RAI tx, however, she did not read the message...  At last  visit, we decided to follow her conservatively if the tests remain stable or improved.  The test will be better so we continued to monitor her conservatively.  Pt denies: - feeling nodules in neck - hoarseness - dysphagia - choking - SOB with lying down  Pt does have a FH of thyroid ds - mother (hypothyroidism). No FH of thyroid cancer. No h/o radiation tx to head or neck.  No seaweed or kelp. No recent contrast studies. No herbal supplements. No Biotin use. No recent steroids use.   Pt. also has a history of HL, controlled diabetes, HTN, GERD.  ROS: Constitutional: + Weight loss, no fatigue, no subjective hyperthermia, no subjective hypothermia Eyes: no blurry vision, no xerophthalmia ENT: no sore throat, + see HPI Cardiovascular: no CP/no SOB/no palpitations/no leg swelling Respiratory: no cough/no SOB/no wheezing Gastrointestinal: + Occasional nausea PMS/no V/no D/no C/no acid reflux Musculoskeletal: no muscle aches/no joint aches Skin: no rashes, no hair loss Neurological: no tremors/no numbness/no tingling/no dizziness  I reviewed pt's medications, allergies, PMH, social hx, family hx, and changes were documented in the history of present illness. Otherwise, unchanged from my initial visit note.  Past Medical History:  Diagnosis Date  . Allergy   . Diabetes mellitus type 2 in obese (HCC) 04/14/2015  . Hypertension   . Hypothyroid   . Positive PPD, treated    9 months of treatment   Past Surgical History:  Procedure Laterality Date  . CHOLECYSTECTOMY  1999  . TONSILLECTOMY  1995   Social History   Social History  . Marital status: Married    Spouse name: N/A  . Number of children: 2   Occupational History  . Clerical Accounting [employer]    [employer]   Social History Main Topics  . Smoking status: Never Smoker  . Smokeless tobacco: Never Used  . Alcohol use No  . Drug use: No  . Sexual activity: Not Currently    Partners:  Male    Birth control/ protection: OCP, Other-see comments     Comment: husband had vasectomy   Current Outpatient Medications on File Prior to Visit  Medication Sig Dispense Refill  . atorvastatin (LIPITOR) 20 MG tablet Take 1 tablet (20 mg total) by mouth daily. 90 tablet 3  . losartan (COZAAR) 50 MG tablet Take 1 tablet (50 mg total) by mouth daily. 90 tablet 3  . metFORMIN (GLUCOPHAGE-XR) 500 MG 24 hr tablet TAKE 1 TABLET(500 MG) BY MOUTH DAILY WITH BREAKFAST 90 tablet 3  . Multiple Vitamin (MULTIVITAMIN) tablet Take 1 tablet by mouth daily.      . ondansetron (ZOFRAN ODT) 4 MG disintegrating tablet Take 1 tablet (4 mg total) by mouth every 6 (six) hours as needed for nausea. 20 tablet 0   No current facility-administered medications on file prior to visit.    Allergies  Allergen Reactions  . Penicillins     REACTION: rash  . Ace Inhibitors Cough  . Clarithromycin Nausea Only    REACTION: Extreme nausea  . Diclofenac Nausea Only   Family History  Problem Relation Age of Onset  . Stroke Other   . Diabetes Other   . Cancer Mother   . Heart disease Mother   . Alcohol abuse Neg Hx   . Mental illness Neg Hx    PE: BP 132/84 (BP Location: Right Arm, Patient Position: Sitting, Cuff Size: Normal)   Pulse 97   Ht 6' (1.829 m)   Wt 248 lb 9.6 oz (112.8 kg)   SpO2 98%   BMI 33.72 kg/m  Wt Readings from Last 3 Encounters:  01/31/18 248 lb 9.6 oz (112.8 kg)  12/28/17 253 lb (114.8 kg)  10/29/17 261 lb 12 oz (118.7 kg)   Constitutional: overweight, in NAD Eyes: PERRLA, EOMI, no exophthalmos ENT: moist mucous membranes, + slight thyromegaly, no cervical lymphadenopathy Cardiovascular: RRR, No MRG Respiratory: CTA B Gastrointestinal: abdomen soft, NT, ND, BS+ Musculoskeletal: no deformities, strength intact in all 4 Skin: moist, warm, no rashes Neurological: no tremor with outstretched hands, DTR normal in all 4  ASSESSMENT: 1. Subclinical Thyrotoxicosis  2. MNG  PLAN:   1. Patient with a long history of subclinical thyrotoxicosis, without thyrotoxic symptoms: No weight loss, heat intolerance, hyper defecation, palpitations, anxiety.  She does have tachycardia which appears to be white coat.  Her heart rate is not high at home per her Fitbit. -Reviewed previous imaging studies reports: Thyroid ultrasound showed multinodular goiter and thyroid uptake and scan showed that 2 of the thyroid nodules were hyperfunctioning.  At that time, I asked her about radioactive iodine treatment but she did not read the message.  At last visit I ordered this but she did not have this done as she again did not read the message.  At last visit we decided to check her TFTs and if they are stable or better, especially since she is asymptomatic, to only continue to follow her conservatively.  At this visit, however, she lost 20 pounds  from last visit.  Per her report, this is partly intentional and partly due to her mother's sickness.  She is still tachycardic...  -We discussed about possible complications of uncontrolled hypothyroidism to include tachyarrhythmia, increased risk of clots including heart attacks and strokes, osteoporosis -We discussed again about possible treatment options and I strongly suggested for her to have the RAI treatment if the tests are worse.  I explained that usually after RAI treatment patients become hypothyroid and need to levothyroxine.  We also discussed that we could use methimazole but this is not usually permanent treatment.  After considering the 2 treatments, and discussed about radioactive precautions after the treatment, she tells me that she agrees with the treatment, if tests are worse.  We will need a thyroid uptake before the RAI treatment, in case we continue with this. -We will check her TFTs today. -She is tachycardic today but she has whitecoat tachycardia. She is aware that she needs to let me know if her heart rate at home at rest is higher than  90.  At the end of the visit, pulse 91. -I will see her back in 6 months.  2. MNG -None of the nodules appear cold per review of her thyroid uptake and scan results -No neck compression symptoms -We will need another thyroid ultrasound approximately 1 year after she becomes euthyroid  Component     Latest Ref Rng & Units 01/31/2018  TSH     0.35 - 4.50 uIU/mL <0.01 (L)  Triiodothyronine,Free,Serum     2.3 - 4.2 pg/mL 4.3 (H)  T4,Free(Direct)     0.60 - 1.60 ng/dL [id number]  TFTs worse.  Will call her to see if she agrees to have the RAI tx now. Will need a thyroid uptake (only) before the RAI tx per radiology dept. Requirement.  If she refuses >> will start MMI.  Carlus Pavlovristina Gedalia Mcmillon, MD PhD [facility]

## 2018-02-04 ENCOUNTER — Telehealth: Payer: Self-pay

## 2018-02-04 NOTE — Telephone Encounter (Signed)
-----   Message from Carlus Pavlovristina Gherghe, MD sent at 02/01/2018  7:45 AM EST ----- Efraim KaufmannMelissa, can you please call pt: thyroid tests are worse. She is now frankly hyperthyroid. Please check with her if she agrees to have the RAI treatment now. Will need a thyroid uptake (only) before the RAI tx per radiology dept. Requirement. I will order this if she agrees to have the treatment.

## 2018-02-25 ENCOUNTER — Other Ambulatory Visit: Payer: Self-pay | Admitting: Family Medicine

## 2018-03-13 ENCOUNTER — Ambulatory Visit (INDEPENDENT_AMBULATORY_CARE_PROVIDER_SITE_OTHER): Payer: 59 | Admitting: Family Medicine

## 2018-03-13 ENCOUNTER — Encounter: Payer: Self-pay | Admitting: Family Medicine

## 2018-03-13 VITALS — BP 149/85 | HR 97 | Ht 74.0 in | Wt 253.0 lb

## 2018-03-13 DIAGNOSIS — Z01419 Encounter for gynecological examination (general) (routine) without abnormal findings: Secondary | ICD-10-CM | POA: Diagnosis not present

## 2018-03-13 NOTE — Progress Notes (Signed)
Pt still get nauseas prior to her period, and has tender breast after her period.   Last Pap was 2016 and was normal

## 2018-03-13 NOTE — Progress Notes (Signed)
   GYNECOLOGY ANNUAL PREVENTATIVE CARE ENCOUNTER NOTE  Subjective:   Carrie Ellis is a 53 y.o. G23P2002 female here for a routine annual gynecologic exam.  Current complaints: none, still having regular periods.   Denies abnormal vaginal bleeding, discharge, pelvic pain, problems with intercourse or other gynecologic concerns.    Gynecologic History Patient's last menstrual period was 02/23/2018 (exact date). Contraception: none Last Pap: 2016- Results were: normal Last mammogram: May 2019. Results were: normal  The following portions of the patient's history were reviewed and updated as appropriate: allergies, current medications, past family history, past medical history, past social history, past surgical history and problem list.  Review of Systems Pertinent items are noted in HPI.   Objective:  BP (!) 149/85   Pulse 97   Ht 6\' 2"  (1.88 m)   Wt 253 lb (114.8 kg)   LMP 02/23/2018 (Exact Date)   BMI 32.48 kg/m  CONSTITUTIONAL: Well-developed, well-nourished female in no acute distress.  HENT:  Normocephalic, atraumatic, External right and left ear normal. Oropharynx is clear and moist EYES:  No scleral icterus.  NECK: Normal range of motion, supple, no masses.  Normal thyroid.  SKIN: Skin is warm and dry. No rash noted. Not diaphoretic. No erythema. No pallor. NEUROLOGIC: Alert and oriented to person, place, and time. Normal reflexes, muscle tone coordination. No cranial nerve deficit noted. PSYCHIATRIC: Normal mood and affect. Normal behavior. Normal judgment and thought content. CARDIOVASCULAR: Normal heart rate noted, regular rhythm. 2+ distal pulses. RESPIRATORY: Effort and breath sounds normal, no problems with respiration noted. BREASTS: Symmetric in size. No masses, skin changes, nipple drainage, or lymphadenopathy. ABDOMEN: Soft,  no distention noted.  No tenderness, rebound or guarding.  PELVIC: Normal appearing external genitalia; normal appearing vaginal  mucosa and cervix.  No abnormal discharge noted. Normal uterine size, no other palpable masses, no uterine or adnexal tenderness. MUSCULOSKELETAL: Normal range of motion.    Assessment and Plan:  1) Annual gynecologic examination - no pap indicated:  Declined STI testing.  Routine preventative health maintenance measures emphasized. Disucssed importance of colonoscopy for CRC screening. Reviewed need for mammogram in May 2020.   2) Perimenopause- reviewed signs and symptoms of menopause  3) Chronic Medical Conditions - HTN - T2DM - Hyperthyroid  PCP to manage above   Please refer to After Visit Summary for other counseling recommendations.   Return in about 1 year (around 03/14/2019) for Yearly wellness exam.  Federico Flake, MD, MPH, ABFM Attending Physician Faculty Practice- [facility]

## 2018-03-13 NOTE — Patient Instructions (Signed)
Preventive Care 40-64 Years, Female Preventive care refers to lifestyle choices and visits with your health care provider that can promote health and wellness. What does preventive care include?   A yearly physical exam. This is also called an annual well check.  Dental exams once or twice a year.  Routine eye exams. Ask your health care provider how often you should have your eyes checked.  Personal lifestyle choices, including: ? Daily care of your teeth and gums. ? Regular physical activity. ? Eating a healthy diet. ? Avoiding tobacco and drug use. ? Limiting alcohol use. ? Practicing safe sex. ? Taking low-dose aspirin daily starting at age 50. ? Taking vitamin and mineral supplements as recommended by your health care provider. What happens during an annual well check? The services and screenings done by your health care provider during your annual well check will depend on your age, overall health, lifestyle risk factors, and family history of disease. Counseling Your health care provider may ask you questions about your:  Alcohol use.  Tobacco use.  Drug use.  Emotional well-being.  Home and relationship well-being.  Sexual activity.  Eating habits.  Work and work environment.  Method of birth control.  Menstrual cycle.  Pregnancy history. Screening You may have the following tests or measurements:  Height, weight, and BMI.  Blood pressure.  Lipid and cholesterol levels. These may be checked every 5 years, or more frequently if you are over 50 years old.  Skin check.  Lung cancer screening. You may have this screening every year starting at age 55 if you have a 30-pack-year history of smoking and currently smoke or have quit within the past 15 years.  Colorectal cancer screening. All adults should have this screening starting at age 50 and continuing until age 75. Your health care provider may recommend screening at age 45. You will have tests every  1-10 years, depending on your results and the type of screening test. People at increased risk should start screening at an earlier age. Screening tests may include: ? Guaiac-based fecal occult blood testing. ? Fecal immunochemical test (FIT). ? Stool DNA test. ? Virtual colonoscopy. ? Sigmoidoscopy. During this test, a flexible tube with a tiny camera (sigmoidoscope) is used to examine your rectum and lower colon. The sigmoidoscope is inserted through your anus into your rectum and lower colon. ? Colonoscopy. During this test, a long, thin, flexible tube with a tiny camera (colonoscope) is used to examine your entire colon and rectum.  Hepatitis C blood test.  Hepatitis B blood test.  Sexually transmitted disease (STD) testing.  Diabetes screening. This is done by checking your blood sugar (glucose) after you have not eaten for a while (fasting). You may have this done every 1-3 years.  Mammogram. This may be done every 1-2 years. Talk to your health care provider about when you should start having regular mammograms. This may depend on whether you have a family history of breast cancer.  BRCA-related cancer screening. This may be done if you have a family history of breast, ovarian, tubal, or peritoneal cancers.  Pelvic exam and Pap test. This may be done every 3 years starting at age 21. Starting at age 30, this may be done every 5 years if you have a Pap test in combination with an HPV test.  Bone density scan. This is done to screen for osteoporosis. You may have this scan if you are at high risk for osteoporosis. Discuss your test results, treatment options,   and if necessary, the need for more tests with your health care provider. Vaccines Your health care provider may recommend certain vaccines, such as:  Influenza vaccine. This is recommended every year.  Tetanus, diphtheria, and acellular pertussis (Tdap, Td) vaccine. You may need a Td booster every 10 years.  Varicella  vaccine. You may need this if you have not been vaccinated.  Zoster vaccine. You may need this after age 38.  Measles, mumps, and rubella (MMR) vaccine. You may need at least one dose of MMR if you were born in 1957 or later. You may also need a second dose.  Pneumococcal 13-valent conjugate (PCV13) vaccine. You may need this if you have certain conditions and were not previously vaccinated.  Pneumococcal polysaccharide (PPSV23) vaccine. You may need one or two doses if you smoke cigarettes or if you have certain conditions.  Meningococcal vaccine. You may need this if you have certain conditions.  Hepatitis A vaccine. You may need this if you have certain conditions or if you travel or work in places where you may be exposed to hepatitis A.  Hepatitis B vaccine. You may need this if you have certain conditions or if you travel or work in places where you may be exposed to hepatitis B.  Haemophilus influenzae type b (Hib) vaccine. You may need this if you have certain conditions. Talk to your health care provider about which screenings and vaccines you need and how often you need them. This information is not intended to replace advice given to you by your health care provider. Make sure you discuss any questions you have with your health care provider. Document Released: 02/26/2015 Document Revised: 03/22/2017 Document Reviewed: 12/01/2014 Elsevier Interactive Patient Education  2019 [employer].

## 2018-03-14 ENCOUNTER — Telehealth: Payer: Self-pay | Admitting: *Deleted

## 2018-03-14 NOTE — Telephone Encounter (Signed)
Pt called stating that Dr Alvester Morin was going to refill her Nausea mediation and nothing was sent into her pharmacy, messaged Dr Alvester Morin to verify and will then send in refill.

## 2018-03-15 ENCOUNTER — Other Ambulatory Visit: Payer: Self-pay | Admitting: *Deleted

## 2018-03-15 DIAGNOSIS — R110 Nausea: Secondary | ICD-10-CM

## 2018-03-15 MED ORDER — ONDANSETRON 4 MG PO TBDP: 4.0000 mg | ORAL_TABLET | Freq: Four times a day (QID) | ORAL | 0 refills | Status: DC | PRN

## 2018-03-25 ENCOUNTER — Other Ambulatory Visit: Payer: Self-pay | Admitting: Family Medicine

## 2018-03-25 LAB — HM DIABETES EYE EXAM

## 2018-04-02 ENCOUNTER — Encounter: Payer: Self-pay | Admitting: Family Medicine

## 2018-04-02 ENCOUNTER — Ambulatory Visit (INDEPENDENT_AMBULATORY_CARE_PROVIDER_SITE_OTHER): Payer: 59 | Admitting: Family Medicine

## 2018-04-02 VITALS — BP 144/90 | HR 90 | Temp 98.8°F | Resp 14 | Ht 71.5 in | Wt 249.8 lb

## 2018-04-02 DIAGNOSIS — J069 Acute upper respiratory infection, unspecified: Secondary | ICD-10-CM

## 2018-04-02 DIAGNOSIS — B9789 Other viral agents as the cause of diseases classified elsewhere: Secondary | ICD-10-CM

## 2018-04-02 NOTE — Progress Notes (Signed)
Subjective:     Carrie Ellis is a 53 y.o. female presenting for URI (Symptoms started on 03/30/2018 with sneezing. Symptoms got worse on 03/31/2018 nasal congestion, runny nose, chest feels sore, abdominal pain, post nasal drainage, cough, ear pain. No headaches. No fever. No body aches. Has taking Diabetic Tussin for her symptoms.)     URI   This is a new problem. The current episode started in the past 7 days. The problem has been gradually worsening. There has been no fever. Associated symptoms include abdominal pain (fullness), chest pain, congestion, coughing, ear pain, rhinorrhea and sneezing. Pertinent negatives include no headaches, nausea, sinus pain, sore throat or vomiting. Treatments tried: cough medication. The treatment provided mild relief.     Review of Systems  HENT: Positive for congestion, ear pain, postnasal drip, rhinorrhea and sneezing. Negative for sinus pain and sore throat.   Respiratory: Positive for cough.   Cardiovascular: Positive for chest pain.  Gastrointestinal: Positive for abdominal pain (fullness). Negative for nausea and vomiting.  Musculoskeletal: Negative for myalgias.  Neurological: Negative for headaches.     Social History   Tobacco Use  Smoking Status Never Smoker  Smokeless Tobacco Never Used        Objective:    BP Readings from Last 3 Encounters:  04/02/18 (!) 144/90  03/13/18 (!) 149/85  01/31/18 132/84   Wt Readings from Last 3 Encounters:  04/02/18 249 lb 12 oz (113.3 kg)  03/13/18 253 lb (114.8 kg)  01/31/18 248 lb 9.6 oz (112.8 kg)    BP (!) 144/90   Pulse 90   Temp 98.8 F (37.1 C)   Resp 14   Ht 5' 11.5" (1.816 m)   Wt 249 lb 12 oz (113.3 kg)   LMP 03/16/2018   SpO2 98%   BMI 34.35 kg/m    Physical Exam Constitutional:      General: She is not in acute distress.    Appearance: She is well-developed. She is not diaphoretic.  HENT:     Head: Normocephalic and atraumatic.     Right Ear: Ear  canal normal. A middle ear effusion is present. Tympanic membrane is not erythematous or bulging.     Left Ear: Ear canal normal. A middle ear effusion is present. Tympanic membrane is not erythematous or bulging.     Nose: Mucosal edema and rhinorrhea present.     Right Sinus: No maxillary sinus tenderness or frontal sinus tenderness.     Left Sinus: No maxillary sinus tenderness or frontal sinus tenderness.     Mouth/Throat:     Pharynx: Uvula midline. Posterior oropharyngeal erythema present. No oropharyngeal exudate.     Tonsils: Swelling: 0 on the right. 0 on the left.  Eyes:     General: No scleral icterus.    Conjunctiva/sclera: Conjunctivae normal.  Neck:     Musculoskeletal: Neck supple.  Cardiovascular:     Rate and Rhythm: Regular rhythm. Tachycardia present.     Heart sounds: Normal heart sounds. No murmur.  Pulmonary:     Effort: Pulmonary effort is normal. No respiratory distress.     Breath sounds: Normal breath sounds.  Lymphadenopathy:     Cervical: No cervical adenopathy.  Skin:    General: Skin is warm and dry.     Capillary Refill: Capillary refill takes less than 2 seconds.  Neurological:     Mental Status: She is alert.           Assessment & Plan:  Problem List Items Addressed This Visit    None    Visit Diagnoses    Viral URI with cough    -  Primary     Symptomatic care Return if worsening   Return if symptoms worsen or fail to improve.  Lynnda Child, MD

## 2018-04-02 NOTE — Patient Instructions (Addendum)
Based on your symptoms, it looks like you have a virus.   Antibiotics are not need for a viral infection but the following will help:   1. Drink plenty of fluids 2. Get lots of rest  Sinus Congestion 1) Saline spray  2) Flonase (Store Brand ok) - once daily 3) Over the counter congestion medications  Cough 1) Cough drops can be helpful 2) Nyquil (or nighttime cough medication)   Sore Throat 1) Warm tea 2) Ibuprofen or Aleve can be helpful 3) Salt water Gargles  If you develop fevers (Temperature >100.4), chills, worsening symptoms or symptoms lasting longer than 10 days return to clinic.

## 2018-04-28 NOTE — Progress Notes (Signed)
Carrie Maddux T. Carrie Viar, MD [facility] at [facility] [address] [place], [address] [phone]  FAX: [phone]  Carrie Ellis - 53 y.o. female  MRN [id number]  Date of Birth: 05-09-1965  Visit Date: 04/29/2018  PCP: Hannah Beat, MD  Referred by: Hannah Beat, MD   Chief Complaint  Patient presents with  . Diabetes     Subjective:   Carrie Ellis is a 53 y.o. very pleasant female patient who presents with the following:  Diabetes Mellitus: Tolerating Medications: yes Compliance with diet: fair, Body mass index is 34.42 kg/m. Exercise: minimal / intermittent Avg blood sugars at home: not checking Foot problems: none Hypoglycemia: none No nausea, vomitting, blurred vision, polyuria.  Lab Results  Component Value Date   HGBA1C 6.9 (A) 04/29/2018   HGBA1C 7.1 (H) 10/29/2017   HGBA1C 6.8 (H) 03/26/2017   Lab Results  Component Value Date   MICROALBUR 13.5 (H) 10/29/2017   LDLCALC 74 10/29/2017   CREATININE 0.85 12/28/2017    Wt Readings from Last 3 Encounters:  04/29/18 250 lb 4 oz (113.5 kg)  04/02/18 249 lb 12 oz (113.3 kg)  03/13/18 253 lb (114.8 kg)    HTN: Tolerating all medications without side effects Stable and at goal No CP, no sob. No HA.  BP Readings from Last 3 Encounters:  04/29/18 (!) 150/80  04/02/18 (!) 144/90  03/13/18 (!) 149/85    Basic Metabolic Panel:    Component Value Date/Time   NA 139 12/28/2017 1035   K 3.4 (L) 12/28/2017 1035   CL 105 12/28/2017 1035   CO2 26 12/28/2017 1035   BUN 8 12/28/2017 1035   CREATININE 0.85 12/28/2017 1035   CREATININE 0.79 09/18/2016 1117   GLUCOSE 186 (H) 12/28/2017 1035   CALCIUM 8.8 12/28/2017 1035     Past Medical History, Surgical History, Social History, Family History, Problem List, Medications, and Allergies have been reviewed and updated if relevant.  Patient Active Problem List   Diagnosis Date  Noted  . Thyrotoxicosis without thyroid storm 11/09/2016  . Multinodular goiter 11/09/2016  . Diabetes mellitus type 2 in obese (HCC) 04/14/2015  . Essential hypertension 12/31/2008  . ALLERGIC RHINITIS 12/31/2008    Past Medical History:  Diagnosis Date  . Allergy   . Diabetes mellitus type 2 in obese (HCC) 04/14/2015  . Hypertension   . Hypothyroid   . Positive PPD, treated    9 months of treatment    Past Surgical History:  Procedure Laterality Date  . CHOLECYSTECTOMY  1999  . TONSILLECTOMY  1995    Social History   Socioeconomic History  . Marital status: Married    Spouse name: Not on file  . Number of children: Not on file  . Years of education: Not on file  . Highest education level: Not on file  Occupational History  . Occupation: Chief Technology Officer: [employer]    Comment: [employer]  Social Needs  . Financial resource strain: Not on file  . Food insecurity:    Worry: Not on file    Inability: Not on file  . Transportation needs:    Medical: Not on file    Non-medical: Not on file  Tobacco Use  . Smoking status: Never Smoker  . Smokeless tobacco: Never Used  Substance and Sexual Activity  . Alcohol use: No  . Drug use: No  . Sexual activity:  Not Currently    Partners: Male    Birth control/protection: Other-see comments    Comment: husband had vasectomy  Lifestyle  . Physical activity:    Days per week: Not on file    Minutes per session: Not on file  . Stress: Not on file  Relationships  . Social connections:    Talks on phone: Not on file    Gets together: Not on file    Attends religious service: Not on file    Active member of club or organization: Not on file    Attends meetings of clubs or organizations: Not on file    Relationship status: Not on file  . Intimate partner violence:    Fear of current or ex partner: Not on file    Emotionally abused: Not on file    Physically abused: Not on file     Forced sexual activity: Not on file  Other Topics Concern  . Not on file  Social History Narrative  . Not on file    Family History  Problem Relation Age of Onset  . Stroke Other   . Diabetes Other   . Cancer Mother   . Heart disease Mother   . Alcohol abuse Neg Hx   . Mental illness Neg Hx     Allergies  Allergen Reactions  . Penicillins     REACTION: rash  . Ace Inhibitors Cough  . Clarithromycin Nausea Only    REACTION: Extreme nausea  . Diclofenac Nausea Only    Medication list reviewed and updated in full in [facility].   GEN: No acute illnesses, no fevers, chills. GI: No n/v/d, eating normally Pulm: No SOB Interactive and getting along well at home.  Otherwise, ROS is as per the HPI.  Objective:   BP (!) 150/80   Pulse 98   Temp 98.8 F (37.1 C) (Oral)   Ht 5' 11.5" (1.816 m)   Wt 250 lb 4 oz (113.5 kg)   BMI 34.42 kg/m   GEN: WDWN, NAD, Non-toxic, A & O x 3 HEENT: Atraumatic, Normocephalic. Neck supple. No masses, No LAD. Ears and Nose: No external deformity. CV: RRR, No M/G/R. No JVD. No thrill. No extra heart sounds. PULM: CTA B, no wheezes, crackles, rhonchi. No retractions. No resp. distress. No accessory muscle use. EXTR: No c/c/e NEURO Normal gait.  PSYCH: Normally interactive. Conversant. Not depressed or anxious appearing.  Calm demeanor.   Diabetic foot exam: Normal inspection No skin breakdown No calluses  Normal DP pulses Normal sensation to light tough Nails normal   Laboratory and Imaging Data:  Assessment and Plan:   Essential hypertension  Diabetes mellitus type 2 in obese (HCC) - Plan: POCT glycosylated hemoglobin (Hb A1C)  BP unstable, increase ARB  Results for orders placed or performed in visit on 04/29/18  POCT glycosylated hemoglobin (Hb A1C)  Result Value Ref Range   Hemoglobin A1C 6.9 (A) 4.0 - 5.6 %   HbA1c POC (<> result, manual entry)     HbA1c, POC (prediabetic range)     HbA1c, POC (controlled  diabetic range)       Follow-up: Return in about 6 months (around 10/30/2018) for CPX and labs 1 week before.  Meds ordered this encounter  Medications  . losartan (COZAAR) 100 MG tablet    Sig: Take 1 tablet (100 mg total) by mouth daily.    Dispense:  90 tablet    Refill:  3   Orders Placed This Encounter  Procedures  . POCT glycosylated hemoglobin (Hb A1C)    Signed,  Lathyn Griggs T. Shantel Helwig, MD   Outpatient Encounter Medications as of 04/29/2018  Medication Sig  . atorvastatin (LIPITOR) 20 MG tablet Take 1 tablet (20 mg total) by mouth daily.  Marland Kitchen losartan (COZAAR) 100 MG tablet Take 1 tablet (100 mg total) by mouth daily.  . metFORMIN (GLUCOPHAGE-XR) 500 MG 24 hr tablet TAKE 1 TABLET(500 MG) BY MOUTH DAILY WITH BREAKFAST  . Multiple Vitamin (MULTIVITAMIN) tablet Take 1 tablet by mouth daily.    . ondansetron (ZOFRAN ODT) 4 MG disintegrating tablet Take 1 tablet (4 mg total) by mouth every 6 (six) hours as needed for nausea.  . [DISCONTINUED] losartan (COZAAR) 50 MG tablet TAKE 1 TABLET(50 MG) BY MOUTH DAILY   No facility-administered encounter medications on file as of 04/29/2018.

## 2018-04-29 ENCOUNTER — Other Ambulatory Visit: Payer: Self-pay

## 2018-04-29 ENCOUNTER — Ambulatory Visit (INDEPENDENT_AMBULATORY_CARE_PROVIDER_SITE_OTHER): Payer: 59 | Admitting: Family Medicine

## 2018-04-29 ENCOUNTER — Encounter: Payer: Self-pay | Admitting: Family Medicine

## 2018-04-29 VITALS — BP 150/80 | HR 98 | Temp 98.8°F | Ht 71.5 in | Wt 250.2 lb

## 2018-04-29 DIAGNOSIS — E1169 Type 2 diabetes mellitus with other specified complication: Secondary | ICD-10-CM

## 2018-04-29 DIAGNOSIS — E669 Obesity, unspecified: Secondary | ICD-10-CM | POA: Diagnosis not present

## 2018-04-29 DIAGNOSIS — I10 Essential (primary) hypertension: Secondary | ICD-10-CM | POA: Diagnosis not present

## 2018-04-29 LAB — POCT GLYCOSYLATED HEMOGLOBIN (HGB A1C): Hemoglobin A1C: 6.9 % — AB (ref 4.0–5.6)

## 2018-04-29 MED ORDER — LOSARTAN POTASSIUM 100 MG PO TABS: 100.0000 mg | ORAL_TABLET | Freq: Every day | ORAL | 3 refills | Status: DC

## 2018-08-07 ENCOUNTER — Other Ambulatory Visit: Payer: Self-pay

## 2018-08-08 ENCOUNTER — Ambulatory Visit (INDEPENDENT_AMBULATORY_CARE_PROVIDER_SITE_OTHER): Payer: 59 | Admitting: Internal Medicine

## 2018-08-08 ENCOUNTER — Encounter: Payer: Self-pay | Admitting: Internal Medicine

## 2018-08-08 VITALS — BP 158/90 | HR 100 | Ht 70.47 in | Wt 252.0 lb

## 2018-08-08 DIAGNOSIS — E042 Nontoxic multinodular goiter: Secondary | ICD-10-CM | POA: Diagnosis not present

## 2018-08-08 NOTE — Progress Notes (Addendum)
Patient ID: Carrie Ellis, female   DOB: 10-Nov-1965, 53 y.o.   MRN: [id number]    HPI  Carrie LaughterKimberly R Ellis is a 53 y.o.-year-old femaleemale, returning for follow-up for multiple thyroid nodules and thyrotoxicosis.  Last visit 6 months ago.  Her mother has terminal cancer and she is now in a facility.  She has a lot of stress.  Reviewed history: Pt has had a low TSH since 1990s>> saw Carrie Ellis >> was on medications >> then TFTs normalized >> stopped the medication (cannot remember name).   After this, she started to see Carrie Ellis and her TFTs started to become abnormal again in 2017.  Reviewed patient's TFTs: Lab Results  Component Value Date   TSH <0.01 (L) 01/31/2018   TSH 0.08 (L) 07/10/2017   TSH 0.05 (L) 03/12/2017   TSH 0.03 (L) 11/09/2016   TSH 0.03 (L) 09/18/2016   TSH 0.03 (L) 04/14/2015   TSH 0.07 (L) 03/29/2015   TSH 0.38 10/23/2011   TSH 0.29 (L) 05/05/2009   FREET4 1.17 01/31/2018   FREET4 0.87 07/10/2017   FREET4 1.00 03/12/2017   FREET4 1.00 11/09/2016   FREET4 1.6 09/18/2016   FREET4 1.14 04/14/2015   FREET4 1.0 05/05/2009   Lab Results  Component Value Date   T3FREE 4.3 (H) 01/31/2018   T3FREE 3.3 07/10/2017   T3FREE 3.4 03/12/2017   T3FREE 3.4 11/09/2016   T3FREE 3.4 09/18/2016   T3FREE 3.3 04/14/2015   2011: TSH 0.29, normal free hormones  TSI's were not high: Lab Results  Component Value Date   TSI <89 11/09/2016   Thyroid ultrasound (04/20/2006 compared with 05/13/2004) showed multinodular goiter with slight interval increase in size of the right inferior thyroid nodule and left inferior thyroid nodule, but otherwise stable size of the nodules, with slightly enlarged thyroid.  At last visit, we checked a thyroid uptake and scan (11/28/2016): She had toxic bilateral thyroid adenomas. Hyper functional BILATERAL thyroid nodules with suppression of uptake in remaining thyroid tissue compatible with hyper functional thyroid adenomas.  24 hour radio iodine uptake of 26%.  At previous visits, we discussed about the need for RAI treatment, but she was not sure about it and did not reply to my messages about scheduling this.  Pt denies: - feeling nodules in neck - hoarseness - dysphagia - choking - SOB with lying down  Pt does have a FH of thyroid ds - mother (hypothyroidism). No FH of thyroid cancer. No h/o radiation tx to head or neck.  No seaweed or kelp. No recent contrast studies. No herbal supplements. No Biotin use. No recent steroids use.   Pt. also has a history of HL, controlled diabetes, HTN, GERD.  ROS: Constitutional: no weight gain/no weight loss, no fatigue, no subjective hyperthermia, no subjective hypothermia Eyes: no blurry vision, no xerophthalmia ENT: no sore throat, + see HPI Cardiovascular: no CP/no SOB/no palpitations/no leg swelling Respiratory: no cough/no SOB/no wheezing Gastrointestinal: no N/no V/no D/no C/no acid reflux Musculoskeletal: no muscle aches/no joint aches Skin: no rashes, no hair loss Neurological: no tremors/no numbness/no tingling/no dizziness  I reviewed pt's medications, allergies, PMH, social hx, family hx, and changes were documented in the history of present illness. Otherwise, unchanged from my initial visit note.  Past Medical History:  Diagnosis Date  . Allergy   . Diabetes mellitus type 2 in obese (HCC) 04/14/2015  . Hypertension   . Hypothyroid   . Positive PPD, treated    9 months of treatment  Past Surgical History:  Procedure Laterality Date  . CHOLECYSTECTOMY  1999  . TONSILLECTOMY  1995   Social History   Social History  . Marital status: Married    Spouse name: N/A  . Number of children: 2   Occupational History  . Clerical Accounting [employer]   Social History Main Topics  . Smoking status: Never Smoker  . Smokeless tobacco: Never Used  . Alcohol use No  . Drug use: No  . Sexual activity:  Not Currently    Partners: Male    Birth control/ protection: OCP, Other-see comments     Comment: husband had vasectomy   Current Outpatient Medications on File Prior to Visit  Medication Sig Dispense Refill  . atorvastatin (LIPITOR) 20 MG tablet Take 1 tablet (20 mg total) by mouth daily. 90 tablet 3  . losartan (COZAAR) 100 MG tablet Take 1 tablet (100 mg total) by mouth daily. 90 tablet 3  . metFORMIN (GLUCOPHAGE-XR) 500 MG 24 hr tablet TAKE 1 TABLET(500 MG) BY MOUTH DAILY WITH BREAKFAST 90 tablet 3  . Multiple Vitamin (MULTIVITAMIN) tablet Take 1 tablet by mouth daily.      . ondansetron (ZOFRAN ODT) 4 MG disintegrating tablet Take 1 tablet (4 mg total) by mouth every 6 (six) hours as needed for nausea. 20 tablet 0   No current facility-administered medications on file prior to visit.    Allergies  Allergen Reactions  . Penicillins     REACTION: rash  . Ace Inhibitors Cough  . Clarithromycin Nausea Only    REACTION: Extreme nausea  . Diclofenac Nausea Only   Family History  Problem Relation Age of Onset  . Stroke Other   . Diabetes Other   . Cancer Mother   . Heart disease Mother   . Alcohol abuse Neg Hx   . Mental illness Neg Hx    PE: BP (!) 158/90   Pulse 100   Ht 5' 10.47" (1.79 m) Comment: measured without shoes  Wt 252 lb (114.3 kg)   BMI 35.68 kg/m  Wt Readings from Last 3 Encounters:  08/08/18 252 lb (114.3 kg)  04/29/18 250 lb 4 oz (113.5 kg)  04/02/18 249 lb 12 oz (113.3 kg)   Constitutional: overweight, in NAD Eyes: PERRLA, EOMI, no exophthalmos ENT: moist mucous membranes, + slight thyromegaly, no cervical lymphadenopathy Cardiovascular: RRR, No MRG Respiratory: CTA B Gastrointestinal: abdomen soft, NT, ND, BS+ Musculoskeletal: no deformities, strength intact in all 4 Skin: moist, warm, no rashes Neurological: no tremor with outstretched hands, DTR normal in all 4  ASSESSMENT: 1. Subclinical Thyrotoxicosis  2. MNG  PLAN:  1. Patient with a  long history of subclinical thyrotoxicosis, without thyrotoxic symptoms: No weight loss, heat intolerance, hyper defecation, palpitations, anxiety.  She does have whitecoat tachycardia.  Her heart rate is not high at home per her Fitbit.  At this visit, her pulse is 100. -I reviewed previous imaging studies reports: Thyroid ultrasound showed multinodular goiter and thyroid uptake and scan showed that 2 of the thyroid nodules were hyperfunctioning.  I asked her repeatedly at previous visit about radioactive iodine treatment but she was ambiguous about it.  I even ordered the thyroid uptake immediately for the RAI treatment but she did not have this either.  At last visit, however, she lost 20 pounds and her TFTs were worse(TSH undetectable and also, free T3 was high for the first time).  I again suggested RAI treatment but  she did not send me a message back through my chart... -at this visit, we discuss about possible consequences of uncontrolled hyper thyroidism to include tachyarrhythmia, hypercoagulability with increased risk of heart attack, strokes and also increased risk for osteoporosis. -We again discussed at today visit about the need for RAI treatment.  I explained that she will most likely become hypothyroid and may need levothyroxine.  We discussed that we could use methimazole but this is not usually permanent treatment. She agrees to have the RAI tx now. -We will repeat her TFTs today. If stable or worse >> will order the thyroid uptake needed before the RAI tx.  -I will see her back in 6 months  2. MNG -None of the nodules appear cold per review of her thyroid uptake and scan results -No neck compression symptoms -We will need another thyroid ultrasound approximately 1 year after she becomes euthyroid  Component     Latest Ref Rng & Units 08/08/2018  TSH     0.35 - 4.50 uIU/mL <0.01 (L)  Triiodothyronine,Free,Serum     2.3 - 4.2 pg/mL 3.8  T4,Free(Direct)     0.60 - 1.60 ng/dL 1.02   TSH is still undetectable.  Free T4 and free T3 now normal.  I would still suggest to go ahead with the RAI treatment.  We will order this along with the thyroid uptake (scan not needed).  Philemon Kingdom, MD PhD [facility]

## 2018-08-08 NOTE — Patient Instructions (Signed)
Please stop at the lab.  Please come back for a follow-up appointment in 6 months.  

## 2018-08-09 ENCOUNTER — Other Ambulatory Visit: Payer: Self-pay | Admitting: Internal Medicine

## 2018-08-09 DIAGNOSIS — E042 Nontoxic multinodular goiter: Secondary | ICD-10-CM

## 2018-08-09 DIAGNOSIS — E0590 Thyrotoxicosis, unspecified without thyrotoxic crisis or storm: Secondary | ICD-10-CM

## 2018-08-09 LAB — T4, FREE: Free T4: 1.02 ng/dL (ref 0.60–1.60)

## 2018-08-09 LAB — TSH: TSH: <0.01 u[IU]/mL — ABNORMAL LOW (ref 0.35–4.50)

## 2018-08-09 LAB — T3, FREE: T3, Free: 3.8 pg/mL (ref 2.3–4.2)

## 2018-08-09 NOTE — Addendum Note (Signed)
Addended by: Philemon Kingdom on: 08/09/2018 04:39 PM   Modules accepted: Orders

## 2018-08-13 ENCOUNTER — Encounter: Payer: Self-pay | Admitting: Family Medicine

## 2018-08-13 ENCOUNTER — Other Ambulatory Visit: Payer: Self-pay | Admitting: Family Medicine

## 2018-08-13 ENCOUNTER — Encounter: Payer: Self-pay | Admitting: Internal Medicine

## 2018-08-13 DIAGNOSIS — Z1231 Encounter for screening mammogram for malignant neoplasm of breast: Secondary | ICD-10-CM

## 2018-08-21 ENCOUNTER — Telehealth: Payer: Self-pay | Admitting: Internal Medicine

## 2018-08-21 NOTE — Telephone Encounter (Signed)
Patient called re: patient has not heard from our office about scheduling appointment for Thyroid Uptake/test. Please call patient at ph# [phone]. Patient states you may leave a detailed message on voice mail if patient is unavailable.

## 2018-08-21 NOTE — Telephone Encounter (Signed)
I see in the chart it has been ordered and it looks like it has been approved, Lattie Haw can you confirm that she can call to schedule?

## 2018-09-09 NOTE — Telephone Encounter (Signed)
Pt called and said she hasn't heard anything in a while and didn't know if she just needed to call to schedule labs or what she needed to do. Please look into and call back, she said if she doesn't answer to just leave a voicemail.

## 2018-09-19 ENCOUNTER — Other Ambulatory Visit (HOSPITAL_COMMUNITY): Payer: 59

## 2018-09-20 ENCOUNTER — Other Ambulatory Visit (HOSPITAL_COMMUNITY): Payer: 59

## 2018-09-23 ENCOUNTER — Encounter (HOSPITAL_COMMUNITY)
Admission: RE | Admit: 2018-09-23 | Discharge: 2018-09-23 | Disposition: A | Discharge status: Home / Self Care | Payer: 59 | Source: Ambulatory Visit | Attending: Internal Medicine | Admitting: Internal Medicine

## 2018-09-23 ENCOUNTER — Other Ambulatory Visit: Payer: Self-pay

## 2018-09-23 DIAGNOSIS — E042 Nontoxic multinodular goiter: Secondary | ICD-10-CM | POA: Insufficient documentation

## 2018-09-23 MED ORDER — SODIUM IODIDE I 131 CAPSULE
20.0000 | Freq: Once | INTRAVENOUS | Status: AC | PRN
  Administered 2018-09-23: 10:00:00 20 via ORAL

## 2018-09-24 ENCOUNTER — Encounter (HOSPITAL_COMMUNITY)
Admission: RE | Admit: 2018-09-24 | Discharge: 2018-09-24 | Disposition: A | Discharge status: Home / Self Care | Payer: 59 | Source: Ambulatory Visit | Attending: Internal Medicine | Admitting: Internal Medicine

## 2018-09-24 DIAGNOSIS — E042 Nontoxic multinodular goiter: Secondary | ICD-10-CM | POA: Diagnosis not present

## 2018-09-27 ENCOUNTER — Other Ambulatory Visit: Payer: Self-pay

## 2018-09-27 ENCOUNTER — Ambulatory Visit
Admission: RE | Admit: 2018-09-27 | Discharge: 2018-09-27 | Disposition: A | Discharge status: Home / Self Care | Payer: 59 | Source: Ambulatory Visit | Attending: Family Medicine | Admitting: Family Medicine

## 2018-09-27 DIAGNOSIS — Z1231 Encounter for screening mammogram for malignant neoplasm of breast: Secondary | ICD-10-CM

## 2018-10-10 ENCOUNTER — Encounter: Payer: Self-pay | Admitting: Internal Medicine

## 2018-10-11 NOTE — Telephone Encounter (Signed)
Carrie Ellis,  Are you able to look in to this and provide [place] with an update?

## 2018-10-11 NOTE — Telephone Encounter (Signed)
LM with Lovena Le at [facility] to set up the RAI treatment

## 2018-10-11 NOTE — Telephone Encounter (Signed)
Please review and advise.

## 2018-10-14 NOTE — Telephone Encounter (Signed)
RAI does not need to be authorized, just awaiting NM to set up appt.

## 2018-10-28 ENCOUNTER — Other Ambulatory Visit: Payer: Self-pay | Admitting: Family Medicine

## 2018-10-28 DIAGNOSIS — E1169 Type 2 diabetes mellitus with other specified complication: Secondary | ICD-10-CM

## 2018-10-28 DIAGNOSIS — Z0000 Encounter for general adult medical examination without abnormal findings: Secondary | ICD-10-CM

## 2018-10-28 DIAGNOSIS — R7989 Other specified abnormal findings of blood chemistry: Secondary | ICD-10-CM

## 2018-10-30 ENCOUNTER — Telehealth: Payer: Self-pay

## 2018-10-30 NOTE — Telephone Encounter (Signed)
Left detailed VM w COVID screen and back door lab info and front door info   

## 2018-11-01 ENCOUNTER — Other Ambulatory Visit: Payer: Self-pay

## 2018-11-01 ENCOUNTER — Ambulatory Visit (HOSPITAL_COMMUNITY)
Admission: RE | Admit: 2018-11-01 | Discharge: 2018-11-01 | Disposition: A | Discharge status: Home / Self Care | Payer: 59 | Source: Ambulatory Visit | Attending: Internal Medicine | Admitting: Internal Medicine

## 2018-11-01 ENCOUNTER — Other Ambulatory Visit (INDEPENDENT_AMBULATORY_CARE_PROVIDER_SITE_OTHER): Payer: 59

## 2018-11-01 DIAGNOSIS — E0520 Thyrotoxicosis with toxic multinodular goiter without thyrotoxic crisis or storm: Secondary | ICD-10-CM | POA: Diagnosis not present

## 2018-11-01 DIAGNOSIS — E1169 Type 2 diabetes mellitus with other specified complication: Secondary | ICD-10-CM

## 2018-11-01 DIAGNOSIS — E042 Nontoxic multinodular goiter: Secondary | ICD-10-CM | POA: Insufficient documentation

## 2018-11-01 DIAGNOSIS — E669 Obesity, unspecified: Secondary | ICD-10-CM

## 2018-11-01 DIAGNOSIS — R7989 Other specified abnormal findings of blood chemistry: Secondary | ICD-10-CM | POA: Diagnosis not present

## 2018-11-01 DIAGNOSIS — Z0000 Encounter for general adult medical examination without abnormal findings: Secondary | ICD-10-CM

## 2018-11-01 LAB — CBC WITH DIFFERENTIAL/PLATELET
Basophils Absolute: 0.1 10*3/uL (ref 0.0–0.1)
Basophils Relative: 1.1 % (ref 0.0–3.0)
Eosinophils Absolute: 0.1 10*3/uL (ref 0.0–0.7)
Eosinophils Relative: 2.5 % (ref 0.0–5.0)
HCT: 37.8 % (ref 36.0–46.0)
Hemoglobin: 12 g/dL (ref 12.0–15.0)
Lymphocytes Relative: 51.3 % — ABNORMAL HIGH (ref 12.0–46.0)
Lymphs Abs: 2.4 10*3/uL (ref 0.7–4.0)
MCHC: 31.8 g/dL (ref 30.0–36.0)
MCV: 74.2 fl — ABNORMAL LOW (ref 78.0–100.0)
Monocytes Absolute: 0.3 10*3/uL (ref 0.1–1.0)
Monocytes Relative: 7 % (ref 3.0–12.0)
Neutro Abs: 1.8 10*3/uL (ref 1.4–7.7)
Neutrophils Relative %: 38.1 % — ABNORMAL LOW (ref 43.0–77.0)
Platelets: 261 10*3/uL (ref 150.0–400.0)
RBC: 5.09 Mil/uL (ref 3.87–5.11)
RDW: 16.3 % — ABNORMAL HIGH (ref 11.5–15.5)
WBC: 4.7 10*3/uL (ref 4.0–10.5)

## 2018-11-01 LAB — HEPATIC FUNCTION PANEL
ALT: 13 U/L (ref 0–35)
AST: 14 U/L (ref 0–37)
Albumin: 4.1 g/dL (ref 3.5–5.2)
Alkaline Phosphatase: 95 U/L (ref 39–117)
Bilirubin, Direct: 0.1 mg/dL (ref 0.0–0.3)
Total Bilirubin: 0.5 mg/dL (ref 0.2–1.2)
Total Protein: 7.1 g/dL (ref 6.0–8.3)

## 2018-11-01 LAB — BASIC METABOLIC PANEL
BUN: 8 mg/dL (ref 6–23)
CO2: 27 mEq/L (ref 19–32)
Calcium: 9.2 mg/dL (ref 8.4–10.5)
Chloride: 103 mEq/L (ref 96–112)
Creatinine, Ser: 0.86 mg/dL (ref 0.40–1.20)
GFR: 83.47 mL/min (ref >60.00)
Glucose, Bld: 141 mg/dL — ABNORMAL HIGH (ref 70–99)
Potassium: 4.1 mEq/L (ref 3.5–5.1)
Sodium: 138 mEq/L (ref 135–145)

## 2018-11-01 LAB — LIPID PANEL
Cholesterol: 142 mg/dL (ref 0–200)
HDL: 59.8 mg/dL (ref >39.00)
LDL Cholesterol: 71 mg/dL (ref 0–99)
NonHDL: 81.77
Total CHOL/HDL Ratio: 2
Triglycerides: 56 mg/dL (ref 0.0–149.0)
VLDL: 11.2 mg/dL (ref 0.0–40.0)

## 2018-11-01 LAB — HCG, SERUM, QUALITATIVE: Preg, Serum: NEGATIVE

## 2018-11-01 LAB — MICROALBUMIN / CREATININE URINE RATIO
Creatinine,U: 154.6 mg/dL
Microalb Creat Ratio: 13.8 mg/g (ref 0.0–30.0)
Microalb, Ur: 21.3 mg/dL — ABNORMAL HIGH (ref 0.0–1.9)

## 2018-11-01 LAB — VITAMIN D 25 HYDROXY (VIT D DEFICIENCY, FRACTURES): VITD: 16.7 ng/mL — ABNORMAL LOW (ref 30.00–100.00)

## 2018-11-01 LAB — HEMOGLOBIN A1C: Hgb A1c MFr Bld: 7.2 % — ABNORMAL HIGH (ref 4.6–6.5)

## 2018-11-01 MED ORDER — SODIUM IODIDE I 131 CAPSULE
31.2000 | Freq: Once | INTRAVENOUS | Status: AC | PRN
  Administered 2018-11-01: 31.2 via ORAL

## 2018-11-03 NOTE — Progress Notes (Signed)
Carrie Chipman T. Tuck Dulworth, MD Primary Care and [place] at [facility] [place], [address] Phone: [phone]  FAX: [place] - 53 y.o. female  MRN [id number]  Date of Birth: 20-Jun-1965  Visit Date: 11/04/2018  PCP: Owens Loffler, MD  Referred by: Owens Loffler, MD  Chief Complaint  Patient presents with  . Annual Exam   Patient Care Team: Owens Loffler, MD as PCP - General Subjective:   Carrie Ellis is a 53 y.o. pleasant patient who presents with the following:  Health Maintenance Summary Reviewed and updated, unless pt declines services.  Tobacco History Reviewed. Non-smoker Alcohol: No concerns, no excessive use Exercise Habits: low level STD concerns: none Drug Use: None Birth control method: n/a Menses regular: n/a Lumps or breast concerns: no Breast Cancer Family History: none  Needs colon cancer screening - declines Goes to GYN  Mother passed away in Jun 30, 2022 Hospice - doing some counselling  COLOGUARD  Diabetes Mellitus: Tolerating Medications: yes Compliance with diet: fair, Body mass index is 35.61 kg/m. Exercise: minimal / intermittent Avg blood sugars at home: not checking Foot problems: none Hypoglycemia: none No nausea, vomitting, blurred vision, polyuria.  Lab Results  Component Value Date   HGBA1C 7.2 (H) 11/01/2018   HGBA1C 6.9 (A) 04/29/2018   HGBA1C 7.1 (H) 10/29/2017   Lab Results  Component Value Date   MICROALBUR 21.3 (H) 11/01/2018   LDLCALC 71 11/01/2018   CREATININE 0.86 11/01/2018    Wt Readings from Last 3 Encounters:  11/04/18 253 lb 8 oz (115 kg)  08/08/18 252 lb (114.3 kg)  04/29/18 250 lb 4 oz (113.5 kg)     Health Maintenance  Topic Date Due  . COLONOSCOPY  08/31/2015  . PAP SMEAR-Modifier  02/09/2018  . INFLUENZA VACCINE  09/14/2018  . OPHTHALMOLOGY EXAM  03/26/2019  . FOOT EXAM  04/29/2019  . HEMOGLOBIN A1C   05/01/2019  . MAMMOGRAM  09/26/2020  . TETANUS/TDAP  03/22/2026  . PNEUMOCOCCAL POLYSACCHARIDE VACCINE AGE 15-64 HIGH RISK  Completed  . HIV Screening  Completed    Immunization History  Administered Date(s) Administered  . Influenza Split 11/07/2011  . Influenza,inj,Quad PF,6+ Mos 11/10/2013, 11/20/2014, 10/06/2015, 10/29/2017  . Pneumococcal Polysaccharide-23 10/06/2015  . Td 02/14/2004  . Tdap 03/22/2016   Patient Active Problem List   Diagnosis Date Noted  . Thyrotoxicosis without thyroid storm 11/09/2016  . Multinodular goiter 11/09/2016  . Diabetes mellitus type 2 in obese ([place]) 04/14/2015  . Essential hypertension 12/31/2008  . ALLERGIC RHINITIS 12/31/2008   Past Medical History:  Diagnosis Date  . Allergy   . Diabetes mellitus type 2 in obese ([place]) 04/14/2015  . Hypertension   . Hypothyroid   . Positive PPD, treated    9 months of treatment   Past Surgical History:  Procedure Laterality Date  . CHOLECYSTECTOMY  1999  . TONSILLECTOMY  1995   Social History   Socioeconomic History  . Marital status: Married    Spouse name: Not on file  . Number of children: Not on file  . Years of education: Not on file  . Highest education level: Not on file  Occupational History  . 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 Financial resource strain: Not on file  . Food insecurity    Worry: Not on file    Inability: Not on  file  . Transportation needs    Medical: Not on file    Non-medical: Not on file  Tobacco Use  . Smoking status: Never Smoker  . Smokeless tobacco: Never Used  Substance and Sexual Activity  . Alcohol use: No  . Drug use: No  . Sexual activity: Not Currently    Partners: Male    Birth control/protection: Other-see comments    Comment: husband had vasectomy  Lifestyle  . Physical activity    Days per week: Not on file    Minutes per session: Not on file  .  Stress: Not on file  Relationships  . Social Musician on phone: Not on file    Gets together: Not on file    Attends religious service: Not on file    Active member of club or organization: Not on file    Attends meetings of clubs or organizations: Not on file    Relationship status: Not on file  . Intimate partner violence    Fear of current or ex partner: Not on file    Emotionally abused: Not on file    Physically abused: Not on file    Forced sexual activity: Not on file  Other Topics Concern  . Not on file  Social History Narrative  . Not on file   Family History  Problem Relation Age of Onset  . Stroke Other   . Diabetes Other   . Cancer Mother   . Heart disease Mother   . Alcohol abuse Neg Hx   . Mental illness Neg Hx    Allergies  Allergen Reactions  . Penicillins     REACTION: rash  . Ace Inhibitors Cough  . Clarithromycin Nausea Only    REACTION: Extreme nausea  . Diclofenac Nausea Only    Medication list has been reviewed and updated.   General: Denies fever, chills, sweats. No significant weight loss. Eyes: Denies blurring,significant itching ENT: Denies earache, sore throat, and hoarseness.  Cardiovascular: Denies chest pains, palpitations, dyspnea on exertion,  Respiratory: Denies cough, dyspnea at rest,wheeezing Breast: no concerns about lumps GI: Denies nausea, vomiting, diarrhea, constipation, change in bowel habits, abdominal pain, melena, hematochezia GU: Denies dysuria, hematuria, urinary hesitancy, nocturia, denies STD risk, no concerns about discharge Musculoskeletal: Denies back pain, joint pain Derm: Denies rash, itching Neuro: Denies  paresthesias, frequent falls, frequent headaches Psych: Denies depression, anxiety Endocrine: Denies cold intolerance, heat intolerance, polydipsia Heme: Denies enlarged lymph nodes Allergy: No hayfever  Objective:   BP 130/72   Pulse 94   Temp 98 F (36.7 C) (Temporal)   Ht 5' 10.75"  (1.797 m)   Wt 253 lb 8 oz (115 kg)   LMP 10/19/2018   SpO2 98%   BMI 35.61 kg/m  Ideal Body Weight: Weight in (lb) to have BMI = 25: 177.6 No exam data present Depression screen [facility] 2/9 11/04/2018 10/29/2017 09/20/2016 07/23/2015  Decreased Interest 0 0 0 0  Down, Depressed, Hopeless 0 0 0 0  PHQ - 2 Score 0 0 0 0   GEN: well developed, well nourished, no acute distress Eyes: conjunctiva and lids normal, PERRLA, EOMI ENT: TM clear, nares clear, oral exam WNL Neck: supple, no lymphadenopathy, no thyromegaly, no JVD Pulm: clear to auscultation and percussion, respiratory effort normal CV: regular rate and rhythm, S1-S2, no murmur, rub or gallop, no bruits Chest: no scars, masses, no lumps BREAST: breast exam declined GI: soft, non-tender; no hepatosplenomegaly, masses; active bowel sounds all  quadrants GU: GU exam declined Lymph: no cervical, axillary or inguinal adenopathy MSK: gait normal, muscle tone and strength WNL, no joint swelling, effusions, discoloration, crepitus  SKIN: clear, good turgor, color WNL, no rashes, lesions, or ulcerations Neuro: normal mental status, normal strength, sensation, and motion Psych: alert; oriented to person, place and time, normally interactive and not anxious or depressed in appearance.  All labs reviewed with patient. Results for orders placed or performed during the hospital encounter of 11/01/18  hCG, serum, qualitative  Result Value Ref Range   Preg, Serum NEGATIVE NEGATIVE   No results found.  Assessment and Plan:     ICD-10-CM   1. Healthcare maintenance  Z00.00   2. Nausea  R11.0 ondansetron (ZOFRAN ODT) 4 MG disintegrating tablet   Globally she is doing well.   Health Maintenance Exam: The patient's preventative maintenance and recommended screening tests for an annual wellness exam were reviewed in full today. Brought up to date unless services declined.  Counselled on the importance of diet, exercise, and its role in overall  health and mortality. The patient's FH and SH was reviewed, including their home life, tobacco status, and drug and alcohol status.  Follow-up in 1 year for physical exam or additional follow-up below.  Follow-up: No follow-ups on file. Or follow-up in 1 year if not noted.  Signed,  Elpidio GaleaSpencer T. Aundrea Higginbotham, MD   Allergies as of 11/04/2018      Reactions   Penicillins    REACTION: rash   Ace Inhibitors Cough   Clarithromycin Nausea Only   REACTION: Extreme nausea   Diclofenac Nausea Only      Medication List       Accurate as of November 04, 2018  8:43 AM. If you have any questions, ask your nurse or doctor.        atorvastatin 20 MG tablet Commonly known as: LIPITOR Take 1 tablet (20 mg total) by mouth daily.   losartan 100 MG tablet Commonly known as: COZAAR Take 1 tablet (100 mg total) by mouth daily.   metFORMIN 500 MG 24 hr tablet Commonly known as: GLUCOPHAGE-XR TAKE 1 TABLET(500 MG) BY MOUTH DAILY WITH BREAKFAST   multivitamin tablet Take 1 tablet by mouth daily.   ondansetron 4 MG disintegrating tablet Commonly known as: Zofran ODT Take 1 tablet (4 mg total) by mouth every 6 (six) hours as needed for nausea.

## 2018-11-04 ENCOUNTER — Other Ambulatory Visit: Payer: Self-pay

## 2018-11-04 ENCOUNTER — Ambulatory Visit (INDEPENDENT_AMBULATORY_CARE_PROVIDER_SITE_OTHER): Payer: 59 | Admitting: Family Medicine

## 2018-11-04 VITALS — BP 130/72 | HR 94 | Temp 98.0°F | Ht 70.75 in | Wt 253.5 lb

## 2018-11-04 DIAGNOSIS — Z0000 Encounter for general adult medical examination without abnormal findings: Secondary | ICD-10-CM | POA: Diagnosis not present

## 2018-11-04 DIAGNOSIS — R110 Nausea: Secondary | ICD-10-CM

## 2018-11-04 MED ORDER — ONDANSETRON 4 MG PO TBDP: 4.0000 mg | ORAL_TABLET | Freq: Four times a day (QID) | ORAL | 0 refills | Status: DC | PRN

## 2018-11-18 LAB — COLOGUARD: Cologuard: NEGATIVE

## 2018-11-19 ENCOUNTER — Ambulatory Visit (INDEPENDENT_AMBULATORY_CARE_PROVIDER_SITE_OTHER): Payer: 59

## 2018-11-19 DIAGNOSIS — Z23 Encounter for immunization: Secondary | ICD-10-CM

## 2018-11-21 ENCOUNTER — Encounter: Payer: Self-pay | Admitting: Family Medicine

## 2018-12-12 ENCOUNTER — Other Ambulatory Visit (INDEPENDENT_AMBULATORY_CARE_PROVIDER_SITE_OTHER): Payer: 59

## 2018-12-12 DIAGNOSIS — E042 Nontoxic multinodular goiter: Secondary | ICD-10-CM | POA: Diagnosis not present

## 2018-12-12 DIAGNOSIS — E0590 Thyrotoxicosis, unspecified without thyrotoxic crisis or storm: Secondary | ICD-10-CM | POA: Diagnosis not present

## 2018-12-13 LAB — T4, FREE: Free T4: 1.25 ng/dL (ref 0.60–1.60)

## 2018-12-13 LAB — TSH: TSH: <0.01 u[IU]/mL — ABNORMAL LOW (ref 0.35–4.50)

## 2018-12-13 LAB — T3, FREE: T3, Free: 3.3 pg/mL (ref 2.3–4.2)

## 2019-01-21 ENCOUNTER — Other Ambulatory Visit: Payer: Self-pay | Admitting: *Deleted

## 2019-01-21 MED ORDER — ATORVASTATIN CALCIUM 20 MG PO TABS: 20.0000 mg | ORAL_TABLET | Freq: Every day | ORAL | 3 refills | Status: DC

## 2019-01-30 ENCOUNTER — Other Ambulatory Visit: Payer: Self-pay

## 2019-01-31 ENCOUNTER — Ambulatory Visit (INDEPENDENT_AMBULATORY_CARE_PROVIDER_SITE_OTHER): Payer: 59 | Admitting: Internal Medicine

## 2019-01-31 ENCOUNTER — Other Ambulatory Visit: Payer: Self-pay | Admitting: Internal Medicine

## 2019-01-31 ENCOUNTER — Encounter: Payer: Self-pay | Admitting: Internal Medicine

## 2019-01-31 VITALS — BP 120/82 | HR 90 | Ht 70.75 in | Wt 264.0 lb

## 2019-01-31 DIAGNOSIS — E669 Obesity, unspecified: Secondary | ICD-10-CM | POA: Diagnosis not present

## 2019-01-31 DIAGNOSIS — E0590 Thyrotoxicosis, unspecified without thyrotoxic crisis or storm: Secondary | ICD-10-CM

## 2019-01-31 DIAGNOSIS — E1169 Type 2 diabetes mellitus with other specified complication: Secondary | ICD-10-CM

## 2019-01-31 DIAGNOSIS — E042 Nontoxic multinodular goiter: Secondary | ICD-10-CM

## 2019-01-31 LAB — T3, FREE: T3, Free: 3 pg/mL (ref 2.3–4.2)

## 2019-01-31 LAB — POCT GLYCOSYLATED HEMOGLOBIN (HGB A1C): Hemoglobin A1C: 6.9 % — AB (ref 4.0–5.6)

## 2019-01-31 LAB — T4, FREE: Free T4: 0.91 ng/dL (ref 0.60–1.60)

## 2019-01-31 LAB — TSH: TSH: 0.81 u[IU]/mL (ref 0.35–4.50)

## 2019-01-31 NOTE — Addendum Note (Signed)
Addended by: Cardell Peach I on: 01/31/2019 11:02 AM   Modules accepted: Orders

## 2019-01-31 NOTE — Patient Instructions (Signed)
Please stop at the lab.  Please come back for a follow-up appointment in 4 months.   

## 2019-01-31 NOTE — Progress Notes (Addendum)
Patient ID: Carrie Ellis, female   DOB: [birth year], 53 y.o.   MRN: [id number]   This visit occurred during the SARS-CoV-2 public health emergency.  Safety protocols were in place, including screening questions prior to the visit, additional usage of staff PPE, and extensive cleaning of exam room while observing appropriate contact time as indicated for disinfecting solutions.   HPI  Carrie Ellis is a 53 y.o.-year-old female, returning for follow-up for multiple thyroid nodules and thyrotoxicosis.  Last visit 6 months ago.  Reviewed history: Pt has had a low TSH since 1990s>> saw Dr. Jeanann Lewandowsky >> was on medications >> then TFTs normalized >> stopped the medication (cannot remember name).   After this, she started to see Dr. Edilia Bo and her TFTs started to become abnormal again in 2017.  Reviewed her TFTs: Lab Results  Component Value Date   TSH <0.01 (L) 12/12/2018   TSH <0.01 (L) 08/08/2018   TSH <0.01 (L) 01/31/2018   TSH 0.08 (L) 07/10/2017   TSH 0.05 (L) 03/12/2017   TSH 0.03 (L) 11/09/2016   TSH 0.03 (L) 09/18/2016   TSH 0.03 (L) 04/14/2015   TSH 0.07 (L) 03/29/2015   TSH 0.38 10/23/2011   FREET4 1.25 12/12/2018   FREET4 1.02 08/08/2018   FREET4 1.17 01/31/2018   FREET4 0.87 07/10/2017   FREET4 1.00 03/12/2017   FREET4 1.00 11/09/2016   FREET4 1.6 09/18/2016   FREET4 1.14 04/14/2015   FREET4 1.0 05/05/2009   Lab Results  Component Value Date   T3FREE 3.3 12/12/2018   T3FREE 3.8 08/08/2018   T3FREE 4.3 (H) 01/31/2018   T3FREE 3.3 07/10/2017   T3FREE 3.4 03/12/2017   T3FREE 3.4 11/09/2016   T3FREE 3.4 09/18/2016   T3FREE 3.3 04/14/2015   2011: TSH 0.29, normal free hormones  Her TSI's were not elevated:: Lab Results  Component Value Date   TSI <89 11/09/2016   Thyroid ultrasound (04/20/2006 compared with 05/13/2004) showed multinodular goiter with slight interval increase in size of the right inferior thyroid nodule and left inferior  thyroid nodule, but otherwise stable size of the nodules, with slightly enlarged thyroid.  At last visit, we checked a thyroid uptake and scan (11/28/2016): She had toxic bilateral thyroid adenomas. Hyper functional BILATERAL thyroid nodules with suppression of uptake in remaining thyroid tissue compatible with hyper functional thyroid adenomas. 24 hour radio iodine uptake of 26%.  At previous visits, we discussed about the need for RAI treatment, but she was not sure about it and did not reply to my messages about scheduling this.  However, at last visit, she finally decided to have this.  We initially checked a thyroid uptake (09/24/2018) which was elevated: 21% (5 to 15%), 37.2% at 24 hours (10 to 30%).  She had RAI treatment with 31.2 mCi on 11/04/2018.  Pt denies: - feeling nodules in neck - hoarseness - dysphagia - choking - SOB with lying down  Pt does have a FH of thyroid ds - mother (hypothyroidism). No FH of thyroid cancer. No h/o radiation tx to head or neck.  No herbal supplements. No Biotin use. No recent steroids use.   Pt. also has a history of HL, HTN, GERD, controlled diabetes (latest HbA1c was 7.2% in 10/2018-managed by PCP).  She takes a MVI.  ROS: Constitutional: + weight gain (11 lbs since last OV)/no weight loss, no fatigue, no subjective hyperthermia, no subjective hypothermia Eyes: no blurry vision, no xerophthalmia ENT: no sore throat, + see HPI Cardiovascular: no CP/no  SOB/no palpitations/no leg swelling Respiratory: no cough/no SOB/no wheezing Gastrointestinal: no N/no V/no D/no C/no acid reflux Musculoskeletal: no muscle aches/no joint aches Skin: no rashes, no hair loss Neurological: no tremors/no numbness/no tingling/no dizziness, + HA  I reviewed pt's medications, allergies, PMH, social hx, family hx, and changes were documented in the history of present illness. Otherwise, unchanged from my initial visit note.  Past Medical History:  Diagnosis  Date  . Allergy   . Diabetes mellitus type 2 in obese (HCC) 04/14/2015  . Hypertension   . Hypothyroid   . Positive PPD, treated    9 months of treatment   Past Surgical History:  Procedure Laterality Date  . CHOLECYSTECTOMY  1999  . TONSILLECTOMY  1995   Social History   Social History  . Marital status: Married    Spouse name: N/A  . Number of children: 2   Occupational History  . 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 Smoking status: Never Smoker  . Smokeless tobacco: Never Used  . Alcohol use No  . Drug use: No  . Sexual activity: Not Currently    Partners: Male    Birth control/ protection: OCP, Other-see comments     Comment: husband had vasectomy   Current Outpatient Medications on File Prior to Visit  Medication Sig Dispense Refill  . atorvastatin (LIPITOR) 20 MG tablet Take 1 tablet (20 mg total) by mouth daily. 90 tablet 3  . losartan (COZAAR) 100 MG tablet Take 1 tablet (100 mg total) by mouth daily. 90 tablet 3  . metFORMIN (GLUCOPHAGE-XR) 500 MG 24 hr tablet TAKE 1 TABLET(500 MG) BY MOUTH DAILY WITH BREAKFAST 90 tablet 3  . Multiple Vitamin (MULTIVITAMIN) tablet Take 1 tablet by mouth daily.      . ondansetron (ZOFRAN ODT) 4 MG disintegrating tablet Take 1 tablet (4 mg total) by mouth every 6 (six) hours as needed for nausea. 20 tablet 0   No current facility-administered medications on file prior to visit.   Allergies  Allergen Reactions  . Penicillins     REACTION: rash  . Ace Inhibitors Cough  . Clarithromycin Nausea Only    REACTION: Extreme nausea  . Diclofenac Nausea Only   Family History  Problem Relation Age of Onset  . Stroke Other   . Diabetes Other   . Cancer Mother   . Heart disease Mother   . Alcohol abuse Neg Hx   . Mental illness Neg Hx    PE: BP 120/82   Pulse 90   Ht 5' 10.75" (1.797 m)   Wt 264 lb (119.7 kg)   SpO2 99%   BMI 37.08 kg/m  Wt Readings from  Last 3 Encounters:  01/31/19 264 lb (119.7 kg)  11/04/18 253 lb 8 oz (115 kg)  08/08/18 252 lb (114.3 kg)   Constitutional: overweight, in NAD Eyes: PERRLA, EOMI, no exophthalmos ENT: moist mucous membranes, no thyromegaly, no cervical lymphadenopathy Cardiovascular: RRR, No MRG Respiratory: CTA B Gastrointestinal: abdomen soft, NT, ND, BS+ Musculoskeletal: no deformities, strength intact in all 4 Skin: moist, warm, no rashes Neurological: no tremor with outstretched hands, DTR normal in all 4  ASSESSMENT: 1. Subclinical Thyrotoxicosis  2. MNG  PLAN:  1. Patient with a long history of subclinical thyrotoxicosis, without thyrotoxic symptoms, no weight loss, heat intolerance, hyper defecation, palpitations, anxiety.  However, she does have whitecoat tachycardia.  Her heart rate was not high at home  per her Fitbit. -Reviewed previous imaging studies reports: Thyroid ultrasound shows multinodular goiter and thyroid uptake and scan showed that 2 of the thyroid nodules are hyperfunctioning.  I suggested radioactive iodine treatment in the past but she could not decide for it.  However, since last visit, she finally had a thyroid uptake and then RAI treatment on 11/04/2018. She feels better after the tx. -At this visit, we discussed that after RAI treatment, patients may remain euthyroid, can be still hyperthyroid, but the majority of the patient become hypothyroid and they need levothyroxine. -At last check, her TSH was still suppressed and free thyroid hormones were normal.  We discussed that the TSH is the last 2 normalized after the treatment -We will recheck her TFTs today. -If we need to start levothyroxine, we discussed about the knees correctly: Fasting, with water, in a.m., separated by breakfast by more than 30 minutes at separated by iron, calcium, mild vitamins and BVI's by more than 4 hours. -I will see her back in 4 months  2. MNG -None of the nodules appear: Per review of her  thyroid uptake and scan report -No neck compression symptoms -We will need another thyroid function approximately 1 year after she becomes euthyroid.  3. DM2 - this continues to be managed by PCP - HbA1c was checked today by mistake: 6.9% (improved)  Office Visit on 01/31/2019  Component Date Value Ref Range Status  . TSH 01/31/2019 0.81  0.35 - 4.50 uIU/mL Final  . Free T4 01/31/2019 0.91  0.60 - 1.60 ng/dL Final   Comment: Specimens from patients who are undergoing biotin therapy and /or ingesting biotin supplements may contain high levels of biotin.  The higher biotin concentration in these specimens interferes with this Free T4 assay.  Specimens that contain high levels  of biotin may cause false high results for this Free T4 assay.  Please interpret results in light of the total clinical presentation of the patient.    . T3, Free 01/31/2019 3.0  2.3 - 4.2 pg/mL Final  . Hemoglobin A1C 01/31/2019 6.9* 4.0 - 5.6 % Final   Thyroid tests are excellent.  We will repeat them in 1.5 to 2 months.  Carlus Pavlov, MD PhD [facility]

## 2019-02-10 ENCOUNTER — Encounter: Payer: Self-pay | Admitting: Radiology

## 2019-03-14 ENCOUNTER — Other Ambulatory Visit: Payer: Self-pay | Admitting: *Deleted

## 2019-03-14 MED ORDER — METFORMIN HCL ER 500 MG PO TB24: ORAL_TABLET | ORAL | 1 refills | Status: DC

## 2019-03-31 ENCOUNTER — Other Ambulatory Visit: Payer: Self-pay

## 2019-03-31 ENCOUNTER — Other Ambulatory Visit (INDEPENDENT_AMBULATORY_CARE_PROVIDER_SITE_OTHER): Payer: 59

## 2019-03-31 DIAGNOSIS — E0590 Thyrotoxicosis, unspecified without thyrotoxic crisis or storm: Secondary | ICD-10-CM | POA: Diagnosis not present

## 2019-03-31 LAB — T3, FREE: T3, Free: 3.1 pg/mL (ref 2.3–4.2)

## 2019-03-31 LAB — TSH: TSH: 1.44 u[IU]/mL (ref 0.35–4.50)

## 2019-03-31 LAB — T4, FREE: Free T4: 0.95 ng/dL (ref 0.60–1.60)

## 2019-04-02 ENCOUNTER — Encounter: Payer: Self-pay | Admitting: Family Medicine

## 2019-04-02 ENCOUNTER — Ambulatory Visit (INDEPENDENT_AMBULATORY_CARE_PROVIDER_SITE_OTHER): Payer: 59 | Admitting: Family Medicine

## 2019-04-02 ENCOUNTER — Other Ambulatory Visit: Payer: Self-pay

## 2019-04-02 VITALS — BP 140/80 | HR 86 | Temp 97.2°F | Ht 70.75 in | Wt 269.2 lb

## 2019-04-02 DIAGNOSIS — S29012A Strain of muscle and tendon of back wall of thorax, initial encounter: Secondary | ICD-10-CM | POA: Diagnosis not present

## 2019-04-02 DIAGNOSIS — S46819A Strain of other muscles, fascia and tendons at shoulder and upper arm level, unspecified arm, initial encounter: Secondary | ICD-10-CM | POA: Diagnosis not present

## 2019-04-02 MED ORDER — CYCLOBENZAPRINE HCL 10 MG PO TABS: 5.0000 mg | ORAL_TABLET | Freq: Every evening | ORAL | 1 refills | Status: AC | PRN

## 2019-04-02 NOTE — Progress Notes (Signed)
Carrie Adcox T. Lashelle Koy, MD Primary Care and Sports Medicine [facility] at [facility] [address] [place], [address] Phone: [phone]  FAX: [phone]  Carrie Ellis - 54 y.o. female  MRN [id number]  Date of Birth: December 15, 1965  Visit Date: 04/02/2019  PCP: Carrie Beat, MD  Referred by: Carrie Beat, MD  Chief Complaint  Patient presents with  . Back Pain    Since Sunday    This visit occurred during the SARS-CoV-2 public health emergency.  Safety protocols were in place, including screening questions prior to the visit, additional usage of staff PPE, and extensive cleaning of exam room while observing appropriate contact time as indicated for disinfecting solutions.   Subjective:   Carrie Ellis is a 54 y.o. very pleasant female patient who presents with the following:  She has essentially been having some escalating pain in the lower trap and rhomboid region since around Thursday when she was making an abrupt movement in her kitchen.  She felt something catch in that region.  Since then she has been having some severe muscle spasm, she is not been able to sleep all that well.  She is not having any numbness or tingling associated with this and no weakness.  Was making something on Snday, and did a noise and she made an abrupt move.  Felt something catch in her back.  Did everythin and during the end of the day hd some severe muscle spasm.  Took some tylenol and still was having some problems. Could not get comfortable.  Lower trap and rhomboids that began on late Sunday  Review of Systems is noted in the HPI, as appropriate  Objective:   BP 140/80   Pulse 86   Temp (!) 97.2 F (36.2 C) (Temporal)   Ht 5' 10.75" (1.797 m)   Wt 269 lb 4 oz (122.1 kg)   SpO2 98%   BMI 37.82 kg/m   GEN: No acute distress; alert,appropriate. PULM: Breathing comfortably in no respiratory distress PSYCH: Normally interactive.    Range of motion at  the waist: Flexion: normal Extension: normal Lateral bending: normal Rotation: all normal  No echymosis or edema Rises to examination table with no difficulty Gait: non antalgic  Inspection/Deformity: N Paraspinus Tenderness: None in the lumbar region, but the patient has bilateral tenderness in the region of the rhomboid and lower traps  Laboratory and Imaging Data:  Assessment and Plan:     ICD-10-CM   1. Rhomboid muscle strain, initial encounter  S29.012A   2. Trapezius strain, unspecified laterality, initial encounter  S46.819A    Acute muscular injury, NSAIDs, Tylenol, heat, massage, muscle relaxant.  In all likelihood, this will resolve in a relatively short amount of time.  Also gave her rhomboid rehabilitation program with some gentle stretching and range of motion  Patient Instructions  Alleve 2 tabs by mouth two times a day over the counter: Take at least for 2 - 3 weeks. This is equal to a prescripton strength dose (GENERIC CHEAPER EQUIVALENT IS NAPROXEN SODIUM)   Take Tylenol/Acetaminophen ES (500mg ) 2 tabs by mouth three times a day max as needed.   Gentle stretching.  Heat. Massage can a lot.     Follow-up: No follow-ups on file.  Meds ordered this encounter  Medications  . cyclobenzaprine (FLEXERIL) 10 MG tablet    Sig: Take 0.5-1 tablets (5-10 mg total) by mouth at bedtime as needed for muscle spasms.    Dispense:  30  tablet    Refill:  1   There are no discontinued medications. No orders of the defined types were placed in this encounter.   Signed,  Carrie Deed. Glender Augusta, MD   Outpatient Encounter Medications as of 04/02/2019  Medication Sig  . atorvastatin (LIPITOR) 20 MG tablet Take 1 tablet (20 mg total) by mouth daily.  Marland Kitchen losartan (COZAAR) 100 MG tablet Take 1 tablet (100 mg total) by mouth daily.  . metFORMIN (GLUCOPHAGE-XR) 500 MG 24 hr tablet Take one tablet by mouth at breakfast  . Multiple Vitamin (MULTIVITAMIN)  tablet Take 1 tablet by mouth daily.    . ondansetron (ZOFRAN ODT) 4 MG disintegrating tablet Take 1 tablet (4 mg total) by mouth every 6 (six) hours as needed for nausea.  . cyclobenzaprine (FLEXERIL) 10 MG tablet Take 0.5-1 tablets (5-10 mg total) by mouth at bedtime as needed for muscle spasms.   No facility-administered encounter medications on file as of 04/02/2019.

## 2019-04-02 NOTE — Patient Instructions (Signed)
Alleve 2 tabs by mouth two times a day over the counter: Take at least for 2 - 3 weeks. This is equal to a prescripton strength dose (GENERIC CHEAPER EQUIVALENT IS NAPROXEN SODIUM)   Take Tylenol/Acetaminophen ES (500mg ) 2 tabs by mouth three times a day max as needed.   Gentle stretching.  Heat. Massage can a lot.

## 2019-04-21 ENCOUNTER — Other Ambulatory Visit: Payer: Self-pay | Admitting: *Deleted

## 2019-04-21 MED ORDER — LOSARTAN POTASSIUM 100 MG PO TABS: 100.0000 mg | ORAL_TABLET | Freq: Every day | ORAL | 1 refills | Status: DC

## 2019-04-22 NOTE — Progress Notes (Signed)
Last pap 2016- normal  No sti testing today Mammogram- 09/27/2018-normal

## 2019-04-23 ENCOUNTER — Ambulatory Visit (INDEPENDENT_AMBULATORY_CARE_PROVIDER_SITE_OTHER): Payer: 59 | Admitting: Family Medicine

## 2019-04-23 ENCOUNTER — Other Ambulatory Visit: Payer: Self-pay

## 2019-04-23 ENCOUNTER — Encounter: Payer: Self-pay | Admitting: Family Medicine

## 2019-04-23 VITALS — BP 160/90 | HR 70 | Wt 267.4 lb

## 2019-04-23 DIAGNOSIS — Z1151 Encounter for screening for human papillomavirus (HPV): Secondary | ICD-10-CM

## 2019-04-23 DIAGNOSIS — Z01419 Encounter for gynecological examination (general) (routine) without abnormal findings: Secondary | ICD-10-CM

## 2019-04-23 DIAGNOSIS — Z124 Encounter for screening for malignant neoplasm of cervix: Secondary | ICD-10-CM | POA: Diagnosis not present

## 2019-04-23 NOTE — Progress Notes (Signed)
   GYNECOLOGY ANNUAL PREVENTATIVE CARE ENCOUNTER NOTE  Subjective:   Carrie Ellis is a 54 y.o. G35P2002 female here for a routine annual gynecologic exam.  Current complaints: none.   Denies abnormal vaginal bleeding, discharge, pelvic pain, problems with intercourse or other gynecologic concerns.    Gynecologic History Patient's last menstrual period was 12/16/2018. Contraception: none Last Pap: 2016. Results were: normal Last mammogram: 2020. Results were: normal  The following portions of the patient's history were reviewed and updated as appropriate: allergies, current medications, past family history, past medical history, past social history, past surgical history and problem list.  Review of Systems Pertinent items are noted in HPI.   Objective:  BP (!) 160/90   Pulse 70   Wt 267 lb 6.4 oz (121.3 kg)   LMP 12/16/2018   BMI 37.56 kg/m  CONSTITUTIONAL: Well-developed, well-nourished female in no acute distress.  HENT:  Normocephalic, atraumatic, External right and left ear normal. Oropharynx is clear and moist EYES:  No scleral icterus.  NECK: Normal range of motion, supple, no masses.  Normal thyroid.  SKIN: Skin is warm and dry. No rash noted. Not diaphoretic. No erythema. No pallor. NEUROLOGIC: Alert and oriented to person, place, and time. Normal reflexes, muscle tone coordination. No cranial nerve deficit noted. PSYCHIATRIC: Normal mood and affect. Normal behavior. Normal judgment and thought content. CARDIOVASCULAR: Normal heart rate noted, regular rhythm. 2+ distal pulses. RESPIRATORY: Effort and breath sounds normal, no problems with respiration noted. BREASTS: Symmetric in size. No masses, skin changes, nipple drainage, or lymphadenopathy. ABDOMEN: Soft,  no distention noted.  No tenderness, rebound or guarding.  PELVIC: Normal appearing external genitalia; normal appearing vaginal mucosa and cervix.  No abnormal discharge noted.  Pap smear obtained.  Normal  uterine size, no other palpable masses, no uterine or adnexal tenderness. MUSCULOSKELETAL: Normal range of motion.    Assessment and Plan:  1) Annual gynecologic examination with pap smear:  Will follow up results of pap smear and manage accordingly. Routine preventative health maintenance measures emphasized.   Please refer to After Visit Summary for other counseling recommendations.   Return in about 1 year (around 04/22/2020) for Yearly wellness exam.  Federico Flake, MD, MPH, ABFM Attending Physician Center for [facility]

## 2019-04-23 NOTE — Patient Instructions (Signed)

## 2019-04-24 LAB — CYTOLOGY - PAP
Comment: NEGATIVE
Diagnosis: NEGATIVE
High risk HPV: NEGATIVE

## 2019-05-05 ENCOUNTER — Encounter: Payer: Self-pay | Admitting: Family Medicine

## 2019-05-05 ENCOUNTER — Ambulatory Visit (INDEPENDENT_AMBULATORY_CARE_PROVIDER_SITE_OTHER): Payer: 59 | Admitting: Family Medicine

## 2019-05-05 ENCOUNTER — Other Ambulatory Visit: Payer: Self-pay

## 2019-05-05 VITALS — BP 150/88 | HR 106 | Temp 97.2°F | Ht 70.75 in | Wt 269.1 lb

## 2019-05-05 DIAGNOSIS — E1169 Type 2 diabetes mellitus with other specified complication: Secondary | ICD-10-CM | POA: Diagnosis not present

## 2019-05-05 DIAGNOSIS — I10 Essential (primary) hypertension: Secondary | ICD-10-CM

## 2019-05-05 DIAGNOSIS — E669 Obesity, unspecified: Secondary | ICD-10-CM

## 2019-05-05 LAB — POCT GLYCOSYLATED HEMOGLOBIN (HGB A1C): Hemoglobin A1C: 7.6 % — AB (ref 4.0–5.6)

## 2019-05-05 MED ORDER — HYDROCHLOROTHIAZIDE 12.5 MG PO TABS: 12.5000 mg | ORAL_TABLET | Freq: Every day | ORAL | 3 refills | Status: DC

## 2019-05-05 NOTE — Progress Notes (Signed)
Callahan Peddie T. Betzabe Bevans, MD Primary Care and Sports Medicine [facility] at [facility] [address] [place], [address] Phone: [phone]  FAX: [phone]  Carrie Ellis - 54 y.o. female  MRN [id number]  Date of Birth: 12-Mar-1965  Visit Date: 05/05/2019  PCP: Hannah Beat, MD  Referred by: Hannah Beat, MD  Chief Complaint  Patient presents with  . Diabetes    This visit occurred during the SARS-CoV-2 public health emergency.  Safety protocols were in place, including screening questions prior to the visit, additional usage of staff PPE, and extensive cleaning of exam room while observing appropriate contact time as indicated for disinfecting solutions.   Subjective:   Carrie Ellis is a 54 y.o. very pleasant female patient who presents with the following:  Diabetes Mellitus: Tolerating Medications: yes Compliance with diet: fair, Body mass index is 37.79 kg/m.  Recently has gotten worse with COVID-19.  She is also eating some higher starch foods. Exercise: minimal / intermittent Avg blood sugars at home: not checking Foot problems: none Hypoglycemia: none No nausea, vomitting, blurred vision, polyuria.  Lab Results  Component Value Date   HGBA1C 7.6 (A) 05/05/2019   HGBA1C 6.9 (A) 01/31/2019   HGBA1C 7.2 (H) 11/01/2018   Lab Results  Component Value Date   MICROALBUR 21.3 (H) 11/01/2018   LDLCALC 71 11/01/2018   CREATININE 0.86 11/01/2018    Wt Readings from Last 3 Encounters:  05/05/19 269 lb 1 oz (122 kg)  04/23/19 267 lb 6.4 oz (121.3 kg)  04/02/19 269 lb 4 oz (122.1 kg)     HTN: Tolerating all medications without side effects: She is having some additional urination during the day, she is going about 1 time every 1 hour. No CP, no sob. No HA.  BP Readings from Last 3 Encounters:  05/05/19 (!) 150/88  04/23/19 (!) 160/90  04/02/19 140/80   160/105  Basic Metabolic Panel:    Component Value  Date/Time   NA 138 11/01/2018 0808   K 4.1 11/01/2018 0808   CL 103 11/01/2018 0808   CO2 27 11/01/2018 0808   BUN 8 11/01/2018 0808   CREATININE 0.86 11/01/2018 0808   CREATININE 0.79 09/18/2016 1117   GLUCOSE 141 (H) 11/01/2018 0808   CALCIUM 9.2 11/01/2018 0808     Review of Systems is noted in the HPI, as appropriate  Objective:   BP (!) 150/88   Pulse (!) 106   Temp (!) 97.2 F (36.2 C) (Temporal)   Ht 5' 10.75" (1.797 m)   Wt 269 lb 1 oz (122 kg)   SpO2 96%   BMI 37.79 kg/m   GEN: No acute distress; alert,appropriate. PULM: Breathing comfortably in no respiratory distress PSYCH: Normally interactive.   Laboratory and Imaging Data: Results for orders placed or performed in visit on 05/05/19  POCT glycosylated hemoglobin (Hb A1C)  Result Value Ref Range   Hemoglobin A1C 7.6 (A) 4.0 - 5.6 %   HbA1c POC (<> result, manual entry)     HbA1c, POC (prediabetic range)     HbA1c, POC (controlled diabetic range)       Assessment and Plan:     ICD-10-CM   1. Essential hypertension  I10   2. Diabetes mellitus type 2 in obese (HCC)  E11.69 POCT glycosylated hemoglobin (Hb A1C)   E66.9    Diabetes is mildly elevated.  Work on diet.  Blood pressure is elevated, at this point I am more concerned about  that comparatively.  Patient Instructions  Mychart in 2 weeks if urinating is much worse, and we can change to something else.     Follow-up: Return in about 3 months (around 08/05/2019).  Meds ordered this encounter  Medications  . hydrochlorothiazide (HYDRODIURIL) 12.5 MG tablet    Sig: Take 1 tablet (12.5 mg total) by mouth daily.    Dispense:  30 tablet    Refill:  3   There are no discontinued medications. Orders Placed This Encounter  Procedures  . POCT glycosylated hemoglobin (Hb A1C)    Signed,  Janziel Hockett T. Quavon Keisling, MD   Outpatient Encounter Medications as of 05/05/2019  Medication Sig  . atorvastatin (LIPITOR) 20 MG tablet Take 1 tablet (20 mg  total) by mouth daily.  . cyclobenzaprine (FLEXERIL) 10 MG tablet Take 0.5-1 tablets (5-10 mg total) by mouth at bedtime as needed for muscle spasms.  Marland Kitchen losartan (COZAAR) 100 MG tablet Take 1 tablet (100 mg total) by mouth daily.  . metFORMIN (GLUCOPHAGE-XR) 500 MG 24 hr tablet Take one tablet by mouth at breakfast  . Multiple Vitamin (MULTIVITAMIN) tablet Take 1 tablet by mouth daily.    . ondansetron (ZOFRAN ODT) 4 MG disintegrating tablet Take 1 tablet (4 mg total) by mouth every 6 (six) hours as needed for nausea.  . hydrochlorothiazide (HYDRODIURIL) 12.5 MG tablet Take 1 tablet (12.5 mg total) by mouth daily.   No facility-administered encounter medications on file as of 05/05/2019.

## 2019-05-05 NOTE — Patient Instructions (Signed)
Mychart in 2 weeks if urinating is much worse, and we can change to something else.

## 2019-05-12 ENCOUNTER — Encounter: Payer: Self-pay | Admitting: Family Medicine

## 2019-06-02 ENCOUNTER — Other Ambulatory Visit: Payer: Self-pay

## 2019-06-04 ENCOUNTER — Ambulatory Visit (INDEPENDENT_AMBULATORY_CARE_PROVIDER_SITE_OTHER): Payer: 59 | Admitting: Internal Medicine

## 2019-06-04 ENCOUNTER — Encounter: Payer: Self-pay | Admitting: Internal Medicine

## 2019-06-04 ENCOUNTER — Other Ambulatory Visit: Payer: Self-pay

## 2019-06-04 VITALS — BP 120/90 | HR 102 | Ht 70.75 in | Wt 270.0 lb

## 2019-06-04 DIAGNOSIS — E0590 Thyrotoxicosis, unspecified without thyrotoxic crisis or storm: Secondary | ICD-10-CM

## 2019-06-04 DIAGNOSIS — E042 Nontoxic multinodular goiter: Secondary | ICD-10-CM | POA: Diagnosis not present

## 2019-06-04 LAB — T3, FREE: T3, Free: 3.3 pg/mL (ref 2.3–4.2)

## 2019-06-04 LAB — T4, FREE: Free T4: 0.99 ng/dL (ref 0.60–1.60)

## 2019-06-04 LAB — TSH: TSH: 1.14 u[IU]/mL (ref 0.35–4.50)

## 2019-06-04 NOTE — Progress Notes (Signed)
Patient ID: Carrie Ellis, female   DOB: 06-07-1965, 54 y.o.   MRN: [id number]   This visit occurred during the SARS-CoV-2 public health emergency.  Safety protocols were in place, including screening questions prior to the visit, additional usage of staff PPE, and extensive cleaning of exam room while observing appropriate contact time as indicated for disinfecting solutions.   HPI  Carrie Ellis is a 54 y.o.-year-old female, returning for follow-up for multiple thyroid nodules and thyrotoxicosis.  Last visit 4 months ago.  Reviewed history: Pt has had a low TSH since 1990s>> saw Dr. Margaretmary Bayley >> was on medications >> then TFTs normalized >> stopped the medication (cannot remember name).   After this, she started to see Dr. Dallas Schimke and her TFTs started to become abnormal again in 2017.  Reviewed her TFTs: Lab Results  Component Value Date   TSH 1.44 03/31/2019   TSH 0.81 01/31/2019   TSH <0.01 (L) 12/12/2018   TSH <0.01 (L) 08/08/2018   TSH <0.01 (L) 01/31/2018   TSH 0.08 (L) 07/10/2017   TSH 0.05 (L) 03/12/2017   TSH 0.03 (L) 11/09/2016   TSH 0.03 (L) 09/18/2016   TSH 0.03 (L) 04/14/2015   FREET4 0.95 03/31/2019   FREET4 0.91 01/31/2019   FREET4 1.25 12/12/2018   FREET4 1.02 08/08/2018   FREET4 1.17 01/31/2018   FREET4 0.87 07/10/2017   FREET4 1.00 03/12/2017   FREET4 1.00 11/09/2016   FREET4 1.6 09/18/2016   FREET4 1.14 04/14/2015   Lab Results  Component Value Date   T3FREE 3.1 03/31/2019   T3FREE 3.0 01/31/2019   T3FREE 3.3 12/12/2018   T3FREE 3.8 08/08/2018   T3FREE 4.3 (H) 01/31/2018   T3FREE 3.3 07/10/2017   T3FREE 3.4 03/12/2017   T3FREE 3.4 11/09/2016   T3FREE 3.4 09/18/2016   T3FREE 3.3 04/14/2015   2011: TSH 0.29, normal free hormones  Her TSI antibodies were not elevated: Lab Results  Component Value Date   TSI <89 11/09/2016   Thyroid ultrasound (04/20/2006 compared with 05/13/2004) showed multinodular goiter with slight  interval increase in size of the right inferior thyroid nodule and left inferior thyroid nodule, but otherwise stable size of the nodules, with slightly enlarged thyroid.  Thyroid uptake and scan (11/28/2016): She had toxic bilateral thyroid adenomas. Hyper functional BILATERAL thyroid nodules with suppression of uptake in remaining thyroid tissue compatible with hyper functional thyroid adenomas. 24 hour radio iodine uptake of 26%.  She had a hard time deciding whether to have the RAI treatment or not and she finally decided for this in 2020:  Thyroid uptake (09/24/2018): elevated: 21% (5 to 15%), 37.2% at 24 hours (10 to 30%).  RAI treatment (11/04/2018): 31.2 mCi  Pt denies: - feeling nodules in neck - hoarseness - dysphagia - choking - SOB with lying down  Pt does have a FH of thyroid ds - mother (hypothyroidism). No FH of thyroid cancer. No h/o radiation tx to head or neck.  No recent contrast studies. No herbal supplements. No Biotin use. No recent steroids use.   Pt. also has a history of HL, HTN, GERD, diabetes (latest HbA1c was 7.6% in 04/2019 -managed by PCP).  She takes a MVI.  She started HCTZ since last OV.  ROS: Constitutional: + weight gain/no weight loss, no fatigue, no subjective hyperthermia, no subjective hypothermia Eyes: no blurry vision, no xerophthalmia ENT: no sore throat, + see HPI Cardiovascular: no CP/no SOB/no palpitations/no leg swelling Respiratory: no cough/no SOB/no wheezing Gastrointestinal: no N/no  V/no D/no C/no acid reflux Musculoskeletal: no muscle aches/no joint aches Skin: no rashes, no hair loss Neurological: no tremors/no numbness/no tingling/no dizziness  I reviewed pt's medications, allergies, PMH, social hx, family hx, and changes were documented in the history of present illness. Otherwise, unchanged from my initial visit note.  Past Medical History:  Diagnosis Date  . Allergy   . Diabetes mellitus type 2 in obese (HCC) 04/14/2015   . Hypertension   . Hypothyroid   . Positive PPD, treated    9 months of treatment   Past Surgical History:  Procedure Laterality Date  . CHOLECYSTECTOMY  1999  . TONSILLECTOMY  1995   Social History   Social History  . Marital status: Married    Spouse name: N/A  . Number of children: 2   Occupational History  . Clerical Accounting Tech [employer]   Social History Main Topics  . Smoking status: Never Smoker  . Smokeless tobacco: Never Used  . Alcohol use No  . Drug use: No  . Sexual activity: Not Currently    Partners: Male    Birth control/ protection: OCP, Other-see comments     Comment: husband had vasectomy   Current Outpatient Medications on File Prior to Visit  Medication Sig Dispense Refill  . atorvastatin (LIPITOR) 20 MG tablet Take 1 tablet (20 mg total) by mouth daily. 90 tablet 3  . cyclobenzaprine (FLEXERIL) 10 MG tablet Take 0.5-1 tablets (5-10 mg total) by mouth at bedtime as needed for muscle spasms. 30 tablet 1  . hydrochlorothiazide (HYDRODIURIL) 12.5 MG tablet Take 1 tablet (12.5 mg total) by mouth daily. 30 tablet 3  . losartan (COZAAR) 100 MG tablet Take 1 tablet (100 mg total) by mouth daily. 90 tablet 1  . metFORMIN (GLUCOPHAGE-XR) 500 MG 24 hr tablet Take one tablet by mouth at breakfast 90 tablet 1  . Multiple Vitamin (MULTIVITAMIN) tablet Take 1 tablet by mouth daily.      . ondansetron (ZOFRAN ODT) 4 MG disintegrating tablet Take 1 tablet (4 mg total) by mouth every 6 (six) hours as needed for nausea. 20 tablet 0   No current facility-administered medications on file prior to visit.   Allergies  Allergen Reactions  . Penicillins     REACTION: rash  . Ace Inhibitors Cough  . Clarithromycin Nausea Only    REACTION: Extreme nausea  . Diclofenac Nausea Only   Family History  Problem Relation Age of Onset  . Stroke Other   . Diabetes Other   . Cancer Mother   . Heart disease Mother   . Alcohol abuse  Neg Hx   . Mental illness Neg Hx    PE: BP 120/90   Pulse (!) 102   Ht 5' 10.75" (1.797 m)   Wt 270 lb (122.5 kg)   SpO2 97%   BMI 37.92 kg/m  Wt Readings from Last 3 Encounters:  06/04/19 270 lb (122.5 kg)  05/05/19 269 lb 1 oz (122 kg)  04/23/19 267 lb 6.4 oz (121.3 kg)   Constitutional: overweight, in NAD Eyes: PERRLA, EOMI, no exophthalmos ENT: moist mucous membranes, no thyromegaly, no cervical lymphadenopathy Cardiovascular: tachycardia, RR, No MRG Respiratory: CTA B Gastrointestinal: abdomen soft, NT, ND, BS+ Musculoskeletal: no deformities, strength intact in all 4 Skin: moist, warm, no rashes Neurological: no tremor with outstretched hands, DTR normal in all 4  ASSESSMENT: 1. Subclinical Thyrotoxicosis  2. MNG  PLAN:  1. Patient with a long  history of subclinical thyrotoxicosis, without thyrotoxic symptoms: No weight loss, heat intolerance or medication, palpitations, anxiety.  However, she does have whitecoat tachycardia.  In the past, she checked her heart rate at home with her Fitbit and this was not high. -We reviewed her previous imaging study reports: The thyroid ultrasound showed multinodular goiter and thyroid uptake and scan showed that 2 of the thyroid nodules were hyperfunctioning.  She finally had RAI treatment on 11/04/2018.  She felt better after the treatment and her TFTs remains normal, so we did not have to start levothyroxine. -Latest TFTs were reviewed from 03/2019: Normal, and we will recheck them today.  If she does need to take levothyroxine, we discussed about how to take this correctly.  I advised that the multivitamins later in the day in that case. -We discussed about signs and symptoms of hypothyroidism. She has weight gain in the last few months >> gained 6 lbs since last OV and 11 before then.  Now that she is vaccinated against coronavirus, she plans to restart going to the gym.  I also advised him to improve her diet. -I will see her back in  6 months.  2. MNG -The nodules appear to be cold on the thyroid uptake and scan -She denies neck compression symptoms -We will check another ultrasound 1 year after she became euthyroid, which will be at next visit  Office Visit on 06/04/2019  Component Date Value Ref Range Status  . TSH 06/04/2019 1.14  0.35 - 4.50 uIU/mL Final  . Free T4 06/04/2019 0.99  0.60 - 1.60 ng/dL Final   Comment: Specimens from patients who are undergoing biotin therapy and /or ingesting biotin supplements may contain high levels of biotin.  The higher biotin concentration in these specimens interferes with this Free T4 assay.  Specimens that contain high levels  of biotin may cause false high results for this Free T4 assay.  Please interpret results in light of the total clinical presentation of the patient.    . T3, Free 06/04/2019 3.3  2.3 - 4.2 pg/mL Final   Normal TFTs!  Philemon Kingdom, MD PhD [facility]

## 2019-06-04 NOTE — Patient Instructions (Signed)
Please stop at the lab.  Please come back for a follow-up appointment in 6 months.  

## 2019-06-16 LAB — HM DIABETES EYE EXAM

## 2019-08-11 ENCOUNTER — Ambulatory Visit (INDEPENDENT_AMBULATORY_CARE_PROVIDER_SITE_OTHER): Payer: 59 | Admitting: Family Medicine

## 2019-08-11 ENCOUNTER — Other Ambulatory Visit: Payer: Self-pay

## 2019-08-11 ENCOUNTER — Encounter: Payer: Self-pay | Admitting: Family Medicine

## 2019-08-11 VITALS — BP 130/88 | HR 96 | Temp 97.6°F | Ht 70.75 in | Wt 266.0 lb

## 2019-08-11 DIAGNOSIS — I10 Essential (primary) hypertension: Secondary | ICD-10-CM | POA: Diagnosis not present

## 2019-08-11 DIAGNOSIS — R110 Nausea: Secondary | ICD-10-CM | POA: Diagnosis not present

## 2019-08-11 DIAGNOSIS — E1169 Type 2 diabetes mellitus with other specified complication: Secondary | ICD-10-CM | POA: Diagnosis not present

## 2019-08-11 DIAGNOSIS — E669 Obesity, unspecified: Secondary | ICD-10-CM

## 2019-08-11 LAB — POCT GLYCOSYLATED HEMOGLOBIN (HGB A1C): Hemoglobin A1C: 10.3 % — AB (ref 4.0–5.6)

## 2019-08-11 MED ORDER — METFORMIN HCL ER 500 MG PO TB24: ORAL_TABLET | ORAL | 1 refills | Status: DC

## 2019-08-11 MED ORDER — ONDANSETRON 4 MG PO TBDP: 4.0000 mg | ORAL_TABLET | Freq: Four times a day (QID) | ORAL | 1 refills | Status: DC | PRN

## 2019-08-11 NOTE — Patient Instructions (Signed)
Increase metformin for to 2 tablets for 2 weeks,   Then increase to 3 tablets for 2 weeks,   Then increase to 4 tablets after that.

## 2019-08-11 NOTE — Progress Notes (Signed)
Janaa Acero T. Verdean Murin, MD, [place] at [facility] [place], [address]  Phone: [phone]  FAX: [place] - 54 y.o. female  MRN [id number]  Date of Birth: May 27, 1965  Date: 08/11/2019  PCP: Owens Loffler, MD  Referral: Owens Loffler, MD  Chief Complaint  Patient presents with  . Follow-up    3 months    This visit occurred during the SARS-CoV-2 public health emergency.  Safety protocols were in place, including screening questions prior to the visit, additional usage of staff PPE, and extensive cleaning of exam room while observing appropriate contact time as indicated for disinfecting solutions.   Subjective:   Carrie Ellis is a 53 y.o. very pleasant female patient with Body mass index is 37.36 kg/m. who presents with the following:  Diabetes Mellitus: Tolerating Medications: yes Compliance with diet: fair, Body mass index is 37.36 kg/m. Exercise: minimal / intermittent Avg blood sugars at home: She brings them in today  foot problems: none Hypoglycemia: none No nausea, vomitting, blurred vision, polyuria.  Since had some increase recently   She brings in her blood sugars, and on her recorded values over the last few months they have increased to generally the upper 100s to occasionally 300s.  Lab Results  Component Value Date   HGBA1C 10.3 (A) 08/11/2019   HGBA1C 7.6 (A) 05/05/2019   HGBA1C 6.9 (A) 01/31/2019   Lab Results  Component Value Date   MICROALBUR 21.3 (H) 11/01/2018   LDLCALC 71 11/01/2018   CREATININE 0.86 11/01/2018    Wt Readings from Last 3 Encounters:  08/11/19 266 lb (120.7 kg)  06/04/19 270 lb (122.5 kg)  05/05/19 269 lb 1 oz (122 kg)     Review of Systems is noted in the HPI, as appropriate  Objective:   BP 130/88   Pulse 96   Temp 97.6 F (36.4 C) (Skin)   Ht 5' 10.75" (1.797 m)   Wt 266  lb (120.7 kg)   LMP 12/16/2018   SpO2 98%   BMI 37.36 kg/m   GEN: No acute distress; alert,appropriate. PULM: Breathing comfortably in no respiratory distress PSYCH: Normally interactive.   Laboratory and Imaging Data: Results for orders placed or performed in visit on 08/11/19  POCT glycosylated hemoglobin (Hb A1C)  Result Value Ref Range   Hemoglobin A1C 10.3 (A) 4.0 - 5.6 %   HbA1c POC (<> result, manual entry)     HbA1c, POC (prediabetic range)     HbA1c, POC (controlled diabetic range)       Assessment and Plan:     ICD-10-CM   1. Diabetes mellitus type 2 in obese (HCC)  E11.69 POCT glycosylated hemoglobin (Hb A1C)   E66.9   2. Essential hypertension  I10   3. Nausea  R11.0 ondansetron (ZOFRAN ODT) 4 MG disintegrating tablet   Worsening of blood sugars in general and A1c.  Titrate up her Metformin dose and recheck in 3 months.  Work on weight loss quite a bit.  Hypertension has stabilized  Patient Instructions  Increase metformin for to 2 tablets for 2 weeks,   Then increase to 3 tablets for 2 weeks,   Then increase to 4 tablets after that.     Follow-up: Return in about 3 months (around 11/11/2019) for complete physical .  Meds ordered this encounter  Medications  . metFORMIN (GLUCOPHAGE-XR) 500 MG 24 hr tablet  Sig: Take four tablets by mouth at breakfast    Dispense:  360 tablet    Refill:  1  . ondansetron (ZOFRAN ODT) 4 MG disintegrating tablet    Sig: Take 1 tablet (4 mg total) by mouth every 6 (six) hours as needed for nausea.    Dispense:  30 tablet    Refill:  1   Medications Discontinued During This Encounter  Medication Reason  . metFORMIN (GLUCOPHAGE-XR) 500 MG 24 hr tablet Reorder  . ondansetron (ZOFRAN ODT) 4 MG disintegrating tablet Reorder   Orders Placed This Encounter  Procedures  . POCT glycosylated hemoglobin (Hb A1C)    Signed,  Amoura Ransier T. Lavoris Sparling, MD   Outpatient Encounter Medications as of 08/11/2019  Medication Sig  .  atorvastatin (LIPITOR) 20 MG tablet Take 1 tablet (20 mg total) by mouth daily.  . cyclobenzaprine (FLEXERIL) 10 MG tablet Take 0.5-1 tablets (5-10 mg total) by mouth at bedtime as needed for muscle spasms.  . hydrochlorothiazide (HYDRODIURIL) 12.5 MG tablet Take 1 tablet (12.5 mg total) by mouth daily.  Marland Kitchen losartan (COZAAR) 100 MG tablet Take 1 tablet (100 mg total) by mouth daily.  . metFORMIN (GLUCOPHAGE-XR) 500 MG 24 hr tablet Take four tablets by mouth at breakfast  . Multiple Vitamin (MULTIVITAMIN) tablet Take 1 tablet by mouth daily.    . [DISCONTINUED] metFORMIN (GLUCOPHAGE-XR) 500 MG 24 hr tablet Take one tablet by mouth at breakfast  . ondansetron (ZOFRAN ODT) 4 MG disintegrating tablet Take 1 tablet (4 mg total) by mouth every 6 (six) hours as needed for nausea.  . [DISCONTINUED] ondansetron (ZOFRAN ODT) 4 MG disintegrating tablet Take 1 tablet (4 mg total) by mouth every 6 (six) hours as needed for nausea. (Patient not taking: Reported on 08/11/2019)   No facility-administered encounter medications on file as of 08/11/2019.

## 2019-09-10 ENCOUNTER — Encounter: Payer: Self-pay | Admitting: Family Medicine

## 2019-10-08 ENCOUNTER — Encounter: Payer: Self-pay | Admitting: Family Medicine

## 2019-10-09 MED ORDER — GLIPIZIDE ER 10 MG PO TB24: 10.0000 mg | ORAL_TABLET | Freq: Every day | ORAL | 3 refills | Status: DC

## 2019-10-09 MED ORDER — METFORMIN HCL ER 500 MG PO TB24: ORAL_TABLET | ORAL | 1 refills | Status: DC

## 2019-10-23 ENCOUNTER — Other Ambulatory Visit: Payer: Self-pay | Admitting: Family Medicine

## 2019-10-27 ENCOUNTER — Other Ambulatory Visit: Payer: Self-pay | Admitting: Family Medicine

## 2019-10-27 DIAGNOSIS — E669 Obesity, unspecified: Secondary | ICD-10-CM

## 2019-10-27 DIAGNOSIS — E559 Vitamin D deficiency, unspecified: Secondary | ICD-10-CM

## 2019-10-27 DIAGNOSIS — E1169 Type 2 diabetes mellitus with other specified complication: Secondary | ICD-10-CM

## 2019-10-27 DIAGNOSIS — Z1159 Encounter for screening for other viral diseases: Secondary | ICD-10-CM

## 2019-10-27 DIAGNOSIS — E785 Hyperlipidemia, unspecified: Secondary | ICD-10-CM

## 2019-10-27 DIAGNOSIS — Z79899 Other long term (current) drug therapy: Secondary | ICD-10-CM

## 2019-10-31 ENCOUNTER — Other Ambulatory Visit: Payer: Self-pay | Admitting: Family Medicine

## 2019-10-31 DIAGNOSIS — Z1231 Encounter for screening mammogram for malignant neoplasm of breast: Secondary | ICD-10-CM

## 2019-11-05 ENCOUNTER — Other Ambulatory Visit: Payer: Self-pay

## 2019-11-05 ENCOUNTER — Other Ambulatory Visit (INDEPENDENT_AMBULATORY_CARE_PROVIDER_SITE_OTHER): Payer: 59

## 2019-11-05 DIAGNOSIS — Z1159 Encounter for screening for other viral diseases: Secondary | ICD-10-CM

## 2019-11-05 DIAGNOSIS — E1169 Type 2 diabetes mellitus with other specified complication: Secondary | ICD-10-CM | POA: Diagnosis not present

## 2019-11-05 DIAGNOSIS — E559 Vitamin D deficiency, unspecified: Secondary | ICD-10-CM | POA: Diagnosis not present

## 2019-11-05 DIAGNOSIS — E669 Obesity, unspecified: Secondary | ICD-10-CM

## 2019-11-05 DIAGNOSIS — Z79899 Other long term (current) drug therapy: Secondary | ICD-10-CM

## 2019-11-05 DIAGNOSIS — E785 Hyperlipidemia, unspecified: Secondary | ICD-10-CM

## 2019-11-05 LAB — HEPATIC FUNCTION PANEL
ALT: 12 U/L (ref 0–35)
AST: 13 U/L (ref 0–37)
Albumin: 4.3 g/dL (ref 3.5–5.2)
Alkaline Phosphatase: 77 U/L (ref 39–117)
Bilirubin, Direct: 0.1 mg/dL (ref 0.0–0.3)
Total Bilirubin: 0.5 mg/dL (ref 0.2–1.2)
Total Protein: 7.7 g/dL (ref 6.0–8.3)

## 2019-11-05 LAB — CBC WITH DIFFERENTIAL/PLATELET
Basophils Absolute: 0 10*3/uL (ref 0.0–0.1)
Basophils Relative: 0.9 % (ref 0.0–3.0)
Eosinophils Absolute: 0.1 10*3/uL (ref 0.0–0.7)
Eosinophils Relative: 2.9 % (ref 0.0–5.0)
HCT: 40.4 % (ref 36.0–46.0)
Hemoglobin: 13.2 g/dL (ref 12.0–15.0)
Lymphocytes Relative: 42.3 % (ref 12.0–46.0)
Lymphs Abs: 2 10*3/uL (ref 0.7–4.0)
MCHC: 32.7 g/dL (ref 30.0–36.0)
MCV: 80.4 fl (ref 78.0–100.0)
Monocytes Absolute: 0.3 10*3/uL (ref 0.1–1.0)
Monocytes Relative: 5.4 % (ref 3.0–12.0)
Neutro Abs: 2.3 10*3/uL (ref 1.4–7.7)
Neutrophils Relative %: 48.5 % (ref 43.0–77.0)
Platelets: 253 10*3/uL (ref 150.0–400.0)
RBC: 5.02 Mil/uL (ref 3.87–5.11)
RDW: 13.9 % (ref 11.5–15.5)
WBC: 4.8 10*3/uL (ref 4.0–10.5)

## 2019-11-05 LAB — BASIC METABOLIC PANEL
BUN: 9 mg/dL (ref 6–23)
CO2: 26 mEq/L (ref 19–32)
Calcium: 9.1 mg/dL (ref 8.4–10.5)
Chloride: 104 mEq/L (ref 96–112)
Creatinine, Ser: 0.83 mg/dL (ref 0.40–1.20)
GFR: 86.62 mL/min (ref >60.00)
Glucose, Bld: 167 mg/dL — ABNORMAL HIGH (ref 70–99)
Potassium: 3.9 mEq/L (ref 3.5–5.1)
Sodium: 138 mEq/L (ref 135–145)

## 2019-11-05 LAB — LIPID PANEL
Cholesterol: 152 mg/dL (ref 0–200)
HDL: 61.9 mg/dL (ref >39.00)
LDL Cholesterol: 77 mg/dL (ref 0–99)
NonHDL: 90.35
Total CHOL/HDL Ratio: 2
Triglycerides: 69 mg/dL (ref 0.0–149.0)
VLDL: 13.8 mg/dL (ref 0.0–40.0)

## 2019-11-05 LAB — HEMOGLOBIN A1C: Hgb A1c MFr Bld: 8.3 % — ABNORMAL HIGH (ref 4.6–6.5)

## 2019-11-05 LAB — MICROALBUMIN / CREATININE URINE RATIO
Creatinine,U: 134.7 mg/dL
Microalb Creat Ratio: 12.7 mg/g (ref 0.0–30.0)
Microalb, Ur: 17.1 mg/dL — ABNORMAL HIGH (ref 0.0–1.9)

## 2019-11-05 LAB — VITAMIN D 25 HYDROXY (VIT D DEFICIENCY, FRACTURES): VITD: 20.61 ng/mL — ABNORMAL LOW (ref 30.00–100.00)

## 2019-11-06 LAB — HEPATITIS C ANTIBODY
Hepatitis C Ab: NONREACTIVE
SIGNAL TO CUT-OFF: 0.02 (ref <1.00)

## 2019-11-11 NOTE — Progress Notes (Signed)
Almendra Loria T. Kreig Parson, MD, CAQ Sports Medicine  Primary Care and Sports Medicine [facility] at [address] [facility] [address] [place], [address]  Phone: [phone]  FAX: [phone]  WANDY BOSSLER - 54 y.o. female  MRN [id number]  Date of Birth: 1965-03-28  Date: 11/12/2019  PCP: Hannah Beat, MD  Referral: Hannah Beat, MD  Chief Complaint  Patient presents with  . Annual Exam    This visit occurred during the SARS-CoV-2 public health emergency.  Safety protocols were in place, including screening questions prior to the visit, additional usage of staff PPE, and extensive cleaning of exam room while observing appropriate contact time as indicated for disinfecting solutions.   Patient Care Team: Hannah Beat, MD as PCP - General Subjective:   Dayle Sherpa Naclerio is a 54 y.o. pleasant patient who presents with the following:  Health Maintenance Summary Reviewed and updated, unless pt declines services.  Tobacco History Reviewed. Non-smoker Alcohol: No concerns, no excessive use Exercise Habits: Some activity, rec at least 30 mins 5 times a week STD concerns: none Drug Use: None Lumps or breast concerns: no  Eye exam Foot exam mammo is scheduled  Cologuard is up to date  Diabetes Mellitus: Tolerating Medications: yes Compliance with diet: diet is much, Body mass index is 37.42 kg/m. Exercise: minimal / intermittent Avg blood sugars at home: 130 -  Foot problems: none Hypoglycemia: none No nausea, vomitting, blurred vision, polyuria.  Lab Results  Component Value Date   HGBA1C 8.3 (H) 11/05/2019   HGBA1C 10.3 (A) 08/11/2019   HGBA1C 7.6 (A) 05/05/2019   Lab Results  Component Value Date   MICROALBUR 17.1 (H) 11/05/2019   LDLCALC 77 11/05/2019   CREATININE 0.83 11/05/2019    Wt Readings from Last 3 Encounters:  11/12/19 264 lb 8 oz (120 kg)  08/11/19 266 lb (120.7 kg)  06/04/19 270 lb (122.5 kg)    HTN:  Tolerating all medications without side effects Stable and at goal No CP, no sob. No HA.  BP Readings from Last 3 Encounters:  11/12/19 (!) 140/92  08/11/19 130/88  06/04/19 120/90    Lipids: Doing well, stable. Tolerating meds fine with no SE. Panel reviewed with patient.  Lipids:  Lab Results  Component Value Date   CHOL 152 11/05/2019   Lab Results  Component Value Date   HDL 61.90 11/05/2019   Lab Results  Component Value Date   LDLCALC 77 11/05/2019   Lab Results  Component Value Date   TRIG 69.0 11/05/2019   Lab Results  Component Value Date   CHOLHDL 2 11/05/2019     Health Maintenance  Topic Date Due  . FOOT EXAM  04/29/2019  . HEMOGLOBIN A1C  05/04/2020  . OPHTHALMOLOGY EXAM  06/15/2020  . MAMMOGRAM  09/26/2020  . Fecal DNA (Cologuard)  11/10/2021  . PAP SMEAR-Modifier  04/22/2024  . TETANUS/TDAP  03/22/2026  . INFLUENZA VACCINE  Completed  . PNEUMOCOCCAL POLYSACCHARIDE VACCINE AGE 7-64 HIGH RISK  Completed  . COVID-19 Vaccine  Completed  . Hepatitis C Screening  Completed  . HIV Screening  Completed    Immunization History  Administered Date(s) Administered  . Influenza Split 11/07/2011  . Influenza,inj,Quad PF,6+ Mos 11/10/2013, 11/20/2014, 10/06/2015, 10/29/2017, 11/19/2018, 11/12/2019  . PFIZER SARS-COV-2 Vaccination 04/25/2019, 05/19/2019  . Pneumococcal Polysaccharide-23 10/06/2015  . Td 02/14/2004  . Tdap 03/22/2016   Patient Active Problem List   Diagnosis Date Noted  . Thyrotoxicosis without thyroid storm 11/09/2016  .  Multinodular goiter 11/09/2016  . Diabetes mellitus type 2 in obese (HCC) 04/14/2015  . Essential hypertension 12/31/2008  . ALLERGIC RHINITIS 12/31/2008    Past Medical History:  Diagnosis Date  . Allergy   . Diabetes mellitus type 2 in obese (HCC) 04/14/2015  . Hypertension   . Hypothyroid   . Positive PPD, treated    9 months of treatment    Past Surgical History:  Procedure Laterality Date  .  CHOLECYSTECTOMY  1999  . TONSILLECTOMY  1995    Family History  Problem Relation Age of Onset  . Stroke Other   . Diabetes Other   . Cancer Mother   . Heart disease Mother   . Alcohol abuse Neg Hx   . Mental illness Neg Hx     Past Medical History, Surgical History, Social History, Family History, Problem List, Medications, and Allergies have been reviewed and updated if relevant.  Review of Systems: Pertinent positives are listed above.  Otherwise, a full 14 point review of systems has been done in full and it is negative except where it is noted positive.  Objective:   BP (!) 140/92   Pulse 98   Temp (!) 97.2 F (36.2 C) (Temporal)   Ht 5' 10.5" (1.791 m)   Wt 264 lb 8 oz (120 kg)   LMP 12/16/2018   SpO2 98%   BMI 37.42 kg/m  Ideal Body Weight: Weight in (lb) to have BMI = 25: 176.4 No exam data present Depression screen [facility] 2/9 11/12/2019 11/04/2018 10/29/2017 09/20/2016 07/23/2015  Decreased Interest 0 0 0 0 0  Down, Depressed, Hopeless 0 0 0 0 0  PHQ - 2 Score 0 0 0 0 0     GEN: well developed, well nourished, no acute distress Eyes: conjunctiva and lids normal, PERRLA, EOMI ENT: TM clear, nares clear, oral exam WNL Neck: supple, no lymphadenopathy, no thyromegaly, no JVD Pulm: clear to auscultation and percussion, respiratory effort normal CV: regular rate and rhythm, S1-S2, no murmur, rub or gallop, no bruits Chest: no scars, masses, no lumps BREAST: breast exam declined GI: soft, non-tender; no hepatosplenomegaly, masses; active bowel sounds all quadrants GU: GU exam declined Lymph: no cervical, axillary or inguinal adenopathy MSK: gait normal, muscle tone and strength WNL, no joint swelling, effusions, discoloration, crepitus  SKIN: clear, good turgor, color WNL, no rashes, lesions, or ulcerations Neuro: normal mental status, normal strength, sensation, and motion Psych: alert; oriented to person, place and time, normally interactive and not anxious or depressed  in appearance.   All labs reviewed with patient. Results for orders placed or performed in visit on 11/12/19  HM DIABETES EYE EXAM  Result Value Ref Range   HM Diabetic Eye Exam No Retinopathy No Retinopathy   No results found.  Assessment and Plan:     ICD-10-CM   1. Healthcare maintenance  Z00.00   2. Need for influenza vaccination  Z23 Flu Vaccine QUAD 6+ mos PF IM (Fluarix Quad PF)   From a general health maintenance standpoint, she is doing okay.  Her A1c is improved and she has all of her blood sugars.  There has been significant improvement in the last 2 months compared to the prior month.  I suspect that her upcoming A1c would be improved compared to her current 8.  At this point continue with diet and activity management.  Follow-up 6 months diabetes.  Blood pressure does appear somewhat high today, but on prior readings as well as at home this  does look normal.  Following with diet as well as activity, at this point I do not want make any changes we will check at her follow-up.  Health Maintenance Exam: The patient's preventative maintenance and recommended screening tests for an annual wellness exam were reviewed in full today. Brought up to date unless services declined.  Counselled on the importance of diet, exercise, and its role in overall health and mortality. The patient's FH and SH was reviewed, including their home life, tobacco status, and drug and alcohol status.  Follow-up in 1 year for physical exam or additional follow-up below.  Follow-up: No follow-ups on file. Or follow-up in 1 year if not noted.  Future Appointments  Date Time Provider Department Center  11/13/2019  4:10 PM [facility] MM 2 [facility]  12/04/2019  3:20 PM Carlus Pavlov, MD [facility] None  05/17/2020  8:00 AM Kada Friesen, Karleen Hampshire, MD [facility]    No orders of the defined types were placed in this encounter.  There are no discontinued medications. Orders Placed This  Encounter  Procedures  . Flu Vaccine QUAD 6+ mos PF IM (Fluarix Quad PF)  . HM DIABETES EYE EXAM    Signed,  Tobi Groesbeck T. Parley Pidcock, MD   Allergies as of 11/12/2019      Reactions   Penicillins    REACTION: rash   Ace Inhibitors Cough   Clarithromycin Nausea Only   REACTION: Extreme nausea   Diclofenac Nausea Only      Medication List       Accurate as of November 12, 2019 11:59 PM. If you have any questions, ask your nurse or doctor.        atorvastatin 20 MG tablet Commonly known as: LIPITOR Take 1 tablet (20 mg total) by mouth daily.   cyclobenzaprine 10 MG tablet Commonly known as: FLEXERIL Take 0.5-1 tablets (5-10 mg total) by mouth at bedtime as needed for muscle spasms.   glipiZIDE 10 MG 24 hr tablet Commonly known as: GLUCOTROL XL Take 1 tablet (10 mg total) by mouth daily with breakfast.   hydrochlorothiazide 12.5 MG tablet Commonly known as: HYDRODIURIL Take 1 tablet (12.5 mg total) by mouth daily.   losartan 100 MG tablet Commonly known as: COZAAR TAKE 1 TABLET(100 MG) BY MOUTH DAILY   metFORMIN 500 MG 24 hr tablet Commonly known as: GLUCOPHAGE-XR Take two tablets by mouth at breakfast   multivitamin tablet Take 1 tablet by mouth daily.   ondansetron 4 MG disintegrating tablet Commonly known as: Zofran ODT Take 1 tablet (4 mg total) by mouth every 6 (six) hours as needed for nausea.

## 2019-11-12 ENCOUNTER — Encounter: Payer: Self-pay | Admitting: Family Medicine

## 2019-11-12 ENCOUNTER — Ambulatory Visit (INDEPENDENT_AMBULATORY_CARE_PROVIDER_SITE_OTHER): Payer: 59 | Admitting: Family Medicine

## 2019-11-12 ENCOUNTER — Other Ambulatory Visit: Payer: Self-pay

## 2019-11-12 VITALS — BP 140/92 | HR 98 | Temp 97.2°F | Ht 70.5 in | Wt 264.5 lb

## 2019-11-12 DIAGNOSIS — Z23 Encounter for immunization: Secondary | ICD-10-CM

## 2019-11-12 DIAGNOSIS — Z0000 Encounter for general adult medical examination without abnormal findings: Secondary | ICD-10-CM

## 2019-11-13 ENCOUNTER — Ambulatory Visit
Admission: RE | Admit: 2019-11-13 | Discharge: 2019-11-13 | Disposition: A | Discharge status: Home / Self Care | Payer: 59 | Source: Ambulatory Visit | Attending: Family Medicine | Admitting: Family Medicine

## 2019-11-13 DIAGNOSIS — Z1231 Encounter for screening mammogram for malignant neoplasm of breast: Secondary | ICD-10-CM

## 2019-12-04 ENCOUNTER — Encounter: Payer: Self-pay | Admitting: Internal Medicine

## 2019-12-04 ENCOUNTER — Other Ambulatory Visit: Payer: Self-pay

## 2019-12-04 ENCOUNTER — Ambulatory Visit (INDEPENDENT_AMBULATORY_CARE_PROVIDER_SITE_OTHER): Payer: 59 | Admitting: Internal Medicine

## 2019-12-04 VITALS — BP 178/94 | HR 100 | Ht 70.5 in | Wt 266.8 lb

## 2019-12-04 DIAGNOSIS — E042 Nontoxic multinodular goiter: Secondary | ICD-10-CM | POA: Diagnosis not present

## 2019-12-04 DIAGNOSIS — E0590 Thyrotoxicosis, unspecified without thyrotoxic crisis or storm: Secondary | ICD-10-CM

## 2019-12-04 LAB — TSH: TSH: 1.2 u[IU]/mL (ref 0.35–4.50)

## 2019-12-04 LAB — T4, FREE: Free T4: 1.09 ng/dL (ref 0.60–1.60)

## 2019-12-04 LAB — T3, FREE: T3, Free: 3.4 pg/mL (ref 2.3–4.2)

## 2019-12-04 NOTE — Patient Instructions (Signed)
Please stop at the lab.  Please come back for a follow-up appointment in 1 year.  

## 2019-12-04 NOTE — Progress Notes (Addendum)
Patient ID: Carrie Ellis, female   DOB: 1965-05-25, 54 y.o.   MRN: [id number]   This visit occurred during the SARS-CoV-2 public health emergency.  Safety protocols were in place, including screening questions prior to the visit, additional usage of staff PPE, and extensive cleaning of exam room while observing appropriate contact time as indicated for disinfecting solutions.   HPI  Carrie Ellis is a 54 y.o.-year-old female, returning for follow-up for multiple thyroid nodules and h/o thyrotoxicosis, now s/p RAI treatment 10/2018.  Last visit 6 months ago.  Reviewed history: Pt has had a low TSH since 1990s>> saw Dr. Margaretmary Bayley >> was on medications >> then TFTs normalized >> stopped the medication (cannot remember name).   After this, she started to see Dr. Dallas Schimke and her TFTs started to become abnormal again in 2017.  Reviewed her TFTs: Lab Results  Component Value Date   TSH 1.14 06/04/2019   TSH 1.44 03/31/2019   TSH 0.81 01/31/2019   TSH <0.01 (L) 12/12/2018   TSH <0.01 (L) 08/08/2018   TSH <0.01 (L) 01/31/2018   TSH 0.08 (L) 07/10/2017   TSH 0.05 (L) 03/12/2017   TSH 0.03 (L) 11/09/2016   TSH 0.03 (L) 09/18/2016   FREET4 0.99 06/04/2019   FREET4 0.95 03/31/2019   FREET4 0.91 01/31/2019   FREET4 1.25 12/12/2018   FREET4 1.02 08/08/2018   FREET4 1.17 01/31/2018   FREET4 0.87 07/10/2017   FREET4 1.00 03/12/2017   FREET4 1.00 11/09/2016   FREET4 1.6 09/18/2016   Lab Results  Component Value Date   T3FREE 3.3 06/04/2019   T3FREE 3.1 03/31/2019   T3FREE 3.0 01/31/2019   T3FREE 3.3 12/12/2018   T3FREE 3.8 08/08/2018   T3FREE 4.3 (H) 01/31/2018   T3FREE 3.3 07/10/2017   T3FREE 3.4 03/12/2017   T3FREE 3.4 11/09/2016   T3FREE 3.4 09/18/2016   2011: TSH 0.29, normal free hormones  Her TSI antibodies were not elevated: Lab Results  Component Value Date   TSI <89 11/09/2016   Reviewed the rest of the investigation and management: Thyroid  ultrasound (04/20/2006 compared with 05/13/2004) showed multinodular goiter with slight interval increase in size of the right inferior thyroid nodule and left inferior thyroid nodule, but otherwise stable size of the nodules, with slightly enlarged thyroid.  Thyroid uptake and scan (11/28/2016): She had toxic bilateral thyroid adenomas. Hyper functional BILATERAL thyroid nodules with suppression of uptake in remaining thyroid tissue compatible with hyper functional thyroid adenomas. 24 hour radio iodine uptake of 26%.  She had a hard time deciding whether to have the RAI treatment or not and she finally decided for this in 2020:  Thyroid uptake (09/24/2018): elevated: 21% (5 to 15%), 37.2% at 24 hours (10 to 30%).  RAI treatment (11/04/2018): 31.2 mCi  Pt denies: - feeling nodules in neck - hoarseness - dysphagia - choking - SOB with lying down  Pt does have a FH of thyroid ds - mother (hypothyroidism). No FH of thyroid cancer. No h/o radiation tx to head or neck except for RAI treatment 11/04/2018.  No seaweed or kelp. No recent contrast studies. No herbal supplements. No Biotin use. No recent steroids use.   Pt. also has a history of HL, HTN, GERD, diabetes (latest HbA1c was 8% << 7.6% in 04/2019 -managed by PCP - recently started Glipizide ER).  She takes a multivitamin.  She started HCTZ since last OV.  ROS: Constitutional: no weight gain/no weight loss, no fatigue, + hot flushes, no subjective  hypothermia Eyes: no blurry vision, no xerophthalmia ENT: no sore throat, + see HPI Cardiovascular: no CP/no SOB/no palpitations/no leg swelling Respiratory: no cough/no SOB/no wheezing Gastrointestinal: no N/no V/no D/no C/no acid reflux Musculoskeletal: no muscle aches/no joint aches Skin: no rashes, no hair loss Neurological: no tremors/no numbness/no tingling/no dizziness  I reviewed pt's medications, allergies, PMH, social hx, family hx, and changes were documented in the history  of present illness. Otherwise, unchanged from my initial visit note.  Past Medical History:  Diagnosis Date  . Allergy   . Diabetes mellitus type 2 in obese (HCC) 04/14/2015  . Hypertension   . Hypothyroid   . Positive PPD, treated    9 months of treatment   Past Surgical History:  Procedure Laterality Date  . CHOLECYSTECTOMY  1999  . TONSILLECTOMY  1995   Social History   Social History  . Marital status: Married    Spouse name: N/A  . Number of children: 2   Occupational History  . Clerical Accounting Tech [employer]   Social History Main Topics  . Smoking status: Never Smoker  . Smokeless tobacco: Never Used  . Alcohol use No  . Drug use: No  . Sexual activity: Not Currently    Partners: Male    Birth control/ protection: OCP, Other-see comments     Comment: husband had vasectomy   Current Outpatient Medications on File Prior to Visit  Medication Sig Dispense Refill  . atorvastatin (LIPITOR) 20 MG tablet Take 1 tablet (20 mg total) by mouth daily. 90 tablet 3  . cyclobenzaprine (FLEXERIL) 10 MG tablet Take 0.5-1 tablets (5-10 mg total) by mouth at bedtime as needed for muscle spasms. 30 tablet 1  . glipiZIDE (GLUCOTROL XL) 10 MG 24 hr tablet Take 1 tablet (10 mg total) by mouth daily with breakfast. 30 tablet 3  . hydrochlorothiazide (HYDRODIURIL) 12.5 MG tablet Take 1 tablet (12.5 mg total) by mouth daily. 30 tablet 3  . losartan (COZAAR) 100 MG tablet TAKE 1 TABLET(100 MG) BY MOUTH DAILY 90 tablet 1  . metFORMIN (GLUCOPHAGE-XR) 500 MG 24 hr tablet Take two tablets by mouth at breakfast 360 tablet 1  . Multiple Vitamin (MULTIVITAMIN) tablet Take 1 tablet by mouth daily.      . ondansetron (ZOFRAN ODT) 4 MG disintegrating tablet Take 1 tablet (4 mg total) by mouth every 6 (six) hours as needed for nausea. 30 tablet 1   No current facility-administered medications on file prior to visit.   Allergies  Allergen Reactions  .  Penicillins     REACTION: rash  . Ace Inhibitors Cough  . Clarithromycin Nausea Only    REACTION: Extreme nausea  . Diclofenac Nausea Only   Family History  Problem Relation Age of Onset  . Stroke Other   . Diabetes Other   . Cancer Mother   . Heart disease Mother   . Alcohol abuse Neg Hx   . Mental illness Neg Hx    PE: BP (!) 178/94   Pulse 100   Ht 5' 10.5" (1.791 m)   Wt 266 lb 12.8 oz (121 kg)   LMP 12/16/2018   SpO2 98%   BMI 37.74 kg/m  She had a stressful phone call before the visit and she also rushed to the visit. Wt Readings from Last 3 Encounters:  12/04/19 266 lb 12.8 oz (121 kg)  11/12/19 264 lb 8 oz (120 kg)  08/11/19 266 lb (120.7 kg)  Constitutional: overweight, in NAD Eyes: PERRLA, EOMI, no exophthalmos ENT: moist mucous membranes, no thyromegaly, no cervical lymphadenopathy Cardiovascular: tachycardia, RR, No MRG Respiratory: CTA B Gastrointestinal: abdomen soft, NT, ND, BS+ Musculoskeletal: no deformities, strength intact in all 4 Skin: moist, warm, no rashes Neurological: no tremor with outstretched hands, DTR normal in all 4  ASSESSMENT: 1. Subclinical Thyrotoxicosis  2. MNG  PLAN:  1. Patient with a long history of subclinical thyrotoxicosis, without thyrotoxic symptoms: No weight loss, heat intolerance, palpitations, anxiety.  However, she does have whitecoat tachycardia.  In the past, she checked her heart rate at home with her feet bit and this was not high. -Reviewed previous investigation: Her thyroid ultrasound showed multinodular goiter and a thyroid uptake and scan showed that 2 of the thyroid nodules were hyperfunctioning.  She finally had an RAI treatment on 11/04/2018.  She felt better after the treatment and her TFTs remains normal so we did not have to start levothyroxine. -At today's visit, we reviewed together her latest TFTs from 05/2019 and these were normal.  We will recheck these today. -We discussed about how to take  levothyroxine in case she needs to start.  She is taking multivitamins, and will need to separate these by at least 4 hours from levothyroxine. -I will see her back in 1 year but possibly sooner for labs  2. MNG -The nodules appear to be hyperfunctioning on the thyroid uptake and scan, but she had RAI treatment since then -No neck compression symptoms -We will check another ultrasound now, after a year of euthyroidism  Orders Placed This Encounter  Procedures  . US THYROID  . TSH  . T4, free  . T3, free   Component     Latest Ref Rng & Units 12/04/2019  TSH     0.35 - 4.50 uIU/mL 1.20  Triiodothyronine,Free,Serum     2.3 - 4.2 pg/mL 3.4  T4,Free(Direct)     0.60 - 1.60 ng/dL 4.70  TFTs are normal.    Thyroid ultrasound (12/17/2019): Parenchymal Echotexture: Moderately heterogenous Isthmus: 0.9 cm Right lobe: 5.6 cm x 2.0 cm x 2.1 cm Left lobe: 6.1 cm x 1.4 cm x 2.2 cm __________________________________________________________________  Nodule # 1: Location: Right; Inferior Maximum size: 3.0 cm; Other 2 dimensions: 1.8 cm x 1.5 cm. This nodule has decreased in size from the prior of 3.9 cm in 2008 Composition: mixed cystic and solid (1) Echogenicity: hypoechoic (2) ACR TI-RADS recommendations: Nodule meets criteria for biopsy ________________________________________________________  Nodule # 2: Location: Isthmus; mid Maximum size: 1.1 cm; Other 2 dimensions: 0.9 cm x 0.8 cm Composition: solid/almost completely solid (2) Echogenicity: hyperechoic (1) Shape: taller-than-wide (3) ACR TI-RADS recommendations: Nodule meets criteria for surveillance _________________________________________________________  Nodule # 3: Location: Isthmus; mid Maximum size: 1.4 cm; Other 2 dimensions: 1.2 cm x 0.8 cm Composition: cannot determine (2) Echogenicity: hypoechoic (2) ACR TI-RADS recommendations:  Nodule meets criteria for  surveillance _________________________________________________________  Nodule # 4: Location: Left; Superior Maximum size: 0.8 cm; Other 2 dimensions: 0.7 cm x 0.4 cm Composition: cannot determine (2) Echogenicity: isoechoic (1) ACR TI-RADS recommendations:  Nodule does not meet criteria for surveillance or biopsy ________________________________________________________  Nodule # 5: Location: Left; Mid Maximum size: 0.6 cm; Other 2 dimensions: 0.6 cm x 0.6 cm Composition: cannot determine (2) Echogenicity: hypoechoic (2) ACR TI-RADS recommendations:  Nodule does not meet criteria for surveillance or biopsy _________________________________________________________  No adenopathy  IMPRESSION: Multinodular thyroid.  Right inferior nodule, (labeled 1, 3.0 cm, TR 3) if  a thyroid nodule and not parathyroid, does meet criteria for biopsy. However, the nodule has decreased to 3.0 cm from a prior 3.9 cm, suggesting benign natural history.  If there is motivation for biopsy, would also suggest consideration first of nuclear medicine sestamibi scan, as this could represent a parathyroid gland/adenoma given the location and appearance, versus biopsy.  Isthmic thyroid nodule (labeled 2, 1.1 cm, TR 4) and the isthmic nodule (labeled 3, 1.4 cm, TR 4) both meet criteria for surveillance, as designated by the newly established ACR TI-RADS criteria. Surveillance ultrasound study recommended to be performed annually up to 5 years.  Recommendations follow those established by the new ACR TI-RADS criteria (J Am Coll Radiol 2017;14:587-595).  Electronically Signed   By: Gilmer MorJaime  Wagner D.O.   On: 12/17/2019 08:52  No history of hyperparathyroidism/hypercalcemia, so no suspicion for parathyroid adenoma. Lab Results  Component Value Date   CALCIUM 9.1 11/05/2019   CALCIUM 9.2 11/01/2018   CALCIUM 8.8 12/28/2017   CALCIUM 9.2 10/29/2017   CALCIUM 9.0 09/18/2016   CALCIUM  9.0 03/29/2015   CALCIUM 8.7 10/23/2011   CALCIUM 9.4 06/09/2010   CALCIUM 9.2 05/05/2009   Will discuss with patient about the possible biopsy of the right inferior nodule.  Thyroid nodule FNA (12/25/2019): A. THYROID, RLP, FINE NEEDLE ASPIRATION:   FINAL MICROSCOPIC DIAGNOSIS:  - Consistent with benign follicular nodule (Bethesda category II)   SPECIMEN ADEQUACY:  Satisfactory for evaluation   Carlus Pavlovristina Arna Luis, MD PhD [facility]

## 2019-12-16 ENCOUNTER — Ambulatory Visit
Admission: RE | Admit: 2019-12-16 | Discharge: 2019-12-16 | Disposition: A | Discharge status: Home / Self Care | Payer: 59 | Source: Ambulatory Visit | Attending: Internal Medicine | Admitting: Internal Medicine

## 2019-12-16 DIAGNOSIS — E042 Nontoxic multinodular goiter: Secondary | ICD-10-CM

## 2019-12-17 ENCOUNTER — Encounter: Payer: Self-pay | Admitting: Internal Medicine

## 2019-12-18 ENCOUNTER — Other Ambulatory Visit: Payer: Self-pay | Admitting: Internal Medicine

## 2019-12-18 DIAGNOSIS — E042 Nontoxic multinodular goiter: Secondary | ICD-10-CM

## 2019-12-25 ENCOUNTER — Ambulatory Visit
Admission: RE | Admit: 2019-12-25 | Discharge: 2019-12-25 | Disposition: A | Discharge status: Home / Self Care | Payer: 59 | Source: Ambulatory Visit | Attending: Internal Medicine | Admitting: Internal Medicine

## 2019-12-25 ENCOUNTER — Other Ambulatory Visit (HOSPITAL_COMMUNITY)
Admission: RE | Admit: 2019-12-25 | Discharge: 2019-12-25 | Disposition: A | Discharge status: Home / Self Care | Payer: 59 | Source: Ambulatory Visit | Attending: Interventional Radiology | Admitting: Interventional Radiology

## 2019-12-25 DIAGNOSIS — E042 Nontoxic multinodular goiter: Secondary | ICD-10-CM | POA: Diagnosis present

## 2019-12-25 DIAGNOSIS — E041 Nontoxic single thyroid nodule: Secondary | ICD-10-CM | POA: Diagnosis not present

## 2019-12-26 LAB — CYTOLOGY - NON PAP

## 2020-01-29 ENCOUNTER — Other Ambulatory Visit: Payer: Self-pay | Admitting: Family Medicine

## 2020-02-19 ENCOUNTER — Encounter: Payer: Self-pay | Admitting: Family Medicine

## 2020-02-19 NOTE — Telephone Encounter (Signed)
[facility] Night - Client Nonclinical Telephone Record AccessNurse Client [facility] Night - Client Client Site [facility] - Night Physician Hannah Beat - MD Contact Type Call Who Is Calling Patient / Member / Family / Caregiver Caller Name Audris Speaker Caller Phone Number [phone] Patient Name Carrie Ellis Patient DOB 02/01/1966 Call Type Message Only Information Provided Reason for Call Request to Schedule Office Appointment Initial Comment Caller states that she would like to be seen today for an ear ache. Additional Comment Provided office hours. Declined triage. Disp. Time Disposition Final User 02/19/2020 8:09:09 AM General Information Provided Yes Tye Maryland Call Closed By: Tye Maryland Transaction Date/Time: 02/19/2020 8:06:16 AM (ET)

## 2020-02-23 ENCOUNTER — Encounter: Payer: Self-pay | Admitting: Family Medicine

## 2020-02-23 ENCOUNTER — Other Ambulatory Visit: Payer: Self-pay

## 2020-02-23 ENCOUNTER — Ambulatory Visit (INDEPENDENT_AMBULATORY_CARE_PROVIDER_SITE_OTHER): Payer: 59 | Admitting: Family Medicine

## 2020-02-23 VITALS — BP 146/90 | HR 96 | Temp 97.9°F | Ht 70.5 in | Wt 268.5 lb

## 2020-02-23 DIAGNOSIS — H60312 Diffuse otitis externa, left ear: Secondary | ICD-10-CM | POA: Diagnosis not present

## 2020-02-23 MED ORDER — NEOMYCIN-POLYMYXIN-HC 1 % OT SOLN: 3.0000 [drp] | Freq: Four times a day (QID) | OTIC | 0 refills | Status: DC

## 2020-02-23 NOTE — Progress Notes (Signed)
Carrie Tamplin T. Lili Harts, MD, CAQ Sports Medicine  Primary Care and Sports Medicine [facility] [address] [place], [address]  Phone: [phone]   FAX: [phone]  Carrie Ellis - 55 y.o. female   MRN [id number]   Date of Birth: 05/04/1965  Date: 02/23/2020   PCP: Carrie Beat, MD   Referral: Carrie Beat, MD  Chief Complaint  Patient presents with   Ear Pain    Left    This visit occurred during the SARS-CoV-2 public health emergency.  Safety protocols were in place, including screening questions prior to the visit, additional usage of staff PPE, and extensive cleaning of exam room while observing appropriate contact time as indicated for disinfecting solutions.   Subjective:   Carrie Ellis is a 55 y.o. very pleasant female patient with Body mass index is 37.98 kg/m. who presents with the following:  She presents with ear pain today.  This is isolated to the left side.  There is no significant right-sided ear pain.  This has been causing her some discomfort to the point where it occasionally wakes her up from sleep over the last 2 weeks.  She has used some Tylenol in the a.m. as well and in the afternoon.  Very recently she has noticed some discharge from the ear.  She has not had any other forms of recent systemic illness.  L ear x 2 weeks Has been using a warm compress Tylenol once in the am and then at AM Some drainage.   OE - on the L  Review of Systems is noted in the HPI, as appropriate  Objective:   BP (!) 146/90    Pulse 96    Temp 97.9 F (36.6 C) (Temporal)    Ht 5' 10.5" (1.791 m)    Wt 268 lb 8 oz (121.8 kg)    LMP 12/16/2018    SpO2 97%    BMI 37.98 kg/m   GEN: No acute distress; alert,appropriate. PULM: Breathing comfortably in no respiratory distress PSYCH: Normally interactive.   Right TM is entirely normal and no tenderness with movement of the ear itself at all.  Bilaterally  there is no lymphadenopathy.  Left TM, there is some serous fluid, but anatomy is easily seen.  There is some tenderness with movement of the ear, pinna, as well as the tragus.  There is some slight irritation along the canal.  Laboratory and Imaging Data:  Assessment and Plan:     ICD-10-CM   1. Acute diffuse otitis externa of left ear  H60.312    On exam, this appears to be most consistent with an otitis externa.  No clear cause.  I am going to have her use some Cortisporin for a week.  If her symptoms persist through next Monday, then a round of some systemic antimicrobials would probably be reasonable.  Meds ordered this encounter  Medications   NEOMYCIN-POLYMYXIN-HYDROCORTISONE (CORTISPORIN) 1 % SOLN OTIC solution    Sig: Place 3 drops into the left ear every 6 (six) hours.    Dispense:  10 mL    Refill:  0  \ Signed,  Gleason Ardoin T. Mahealani Sulak, MD   Outpatient Encounter Medications as of 02/23/2020  Medication Sig   atorvastatin (LIPITOR) 20 MG tablet Take 1 tablet (20 mg total) by mouth daily.   cyclobenzaprine (FLEXERIL) 10 MG tablet Take 0.5-1 tablets (5-10 mg total) by mouth at bedtime as needed for muscle spasms.   glipiZIDE (GLUCOTROL  XL) 10 MG 24 hr tablet TAKE 1 TABLET(10 MG) BY MOUTH DAILY WITH BREAKFAST   losartan (COZAAR) 100 MG tablet TAKE 1 TABLET(100 MG) BY MOUTH DAILY   metFORMIN (GLUCOPHAGE-XR) 500 MG 24 hr tablet Take two tablets by mouth at breakfast   Multiple Vitamin (MULTIVITAMIN) tablet Take 1 tablet by mouth daily.   NEOMYCIN-POLYMYXIN-HYDROCORTISONE (CORTISPORIN) 1 % SOLN OTIC solution Place 3 drops into the left ear every 6 (six) hours.   ondansetron (ZOFRAN ODT) 4 MG disintegrating tablet Take 1 tablet (4 mg total) by mouth every 6 (six) hours as needed for nausea.   hydrochlorothiazide (HYDRODIURIL) 12.5 MG tablet Take 1 tablet (12.5 mg total) by mouth daily. (Patient not taking: No sig reported)   No facility-administered encounter medications  on file as of 02/23/2020.

## 2020-03-02 ENCOUNTER — Encounter: Payer: Self-pay | Admitting: Radiology

## 2020-04-17 ENCOUNTER — Other Ambulatory Visit: Payer: Self-pay | Admitting: Family Medicine

## 2020-05-16 ENCOUNTER — Encounter: Payer: Self-pay | Admitting: Family Medicine

## 2020-05-16 NOTE — Progress Notes (Signed)
Sheyli Horwitz T. Jaselle Pryer, MD, CAQ Sports Medicine  Primary Care and Sports Medicine [facility] [address] [place], [address]  Phone: [phone]  FAX: [phone]  Carrie Ellis - 55 y.o. female  MRN [id number]  Date of Birth: 29-Jun-1965  Date: 05/17/2020  PCP: Hannah Beat, MD  Referral: Hannah Beat, MD  Chief Complaint  Patient presents with  . Diabetes    This visit occurred during the SARS-CoV-2 public health emergency.  Safety protocols were in place, including screening questions prior to the visit, additional usage of staff PPE, and extensive cleaning of exam room while observing appropriate contact time as indicated for disinfecting solutions.   Subjective:   Carrie Ellis is a 55 y.o. very pleasant female patient with Body mass index is 37.23 kg/m. who presents with the following:  Diabetes Mellitus: Tolerating Medications: yes Compliance with diet: very good, Body mass index is 37.23 kg/m. Exercise: started working out a lot and progressing as she is able Avg blood sugars at home: not checking Foot problems: none Hypoglycemia: none No nausea, vomitting, blurred vision, polyuria.  Lab Results  Component Value Date   HGBA1C 7.1 (A) 05/17/2020   HGBA1C 8.3 (H) 11/05/2019   HGBA1C 10.3 (A) 08/11/2019   Lab Results  Component Value Date   MICROALBUR 17.1 (H) 11/05/2019   LDLCALC 77 11/05/2019   CREATININE 0.83 11/05/2019    Wt Readings from Last 3 Encounters:  05/17/20 263 lb 3 oz (119.4 kg)  02/23/20 268 lb 8 oz (121.8 kg)  12/04/19 266 lb 12.8 oz (121 kg)    HTN: Tolerating all medications without side effects No CP, no sob. No HA. - Stopped HCTZ  BP Readings from Last 3 Encounters:  05/17/20 (!) 160/104  02/23/20 (!) 146/90  12/04/19 (!) 178/94   165/108  Basic Metabolic Panel:    Component Value Date/Time   NA 138 11/05/2019 0812   K 3.9 11/05/2019 0812   CL 104  11/05/2019 0812   CO2 26 11/05/2019 0812   BUN 9 11/05/2019 0812   CREATININE 0.83 11/05/2019 0812   CREATININE 0.79 09/18/2016 1117   GLUCOSE 167 (H) 11/05/2019 0812   CALCIUM 9.1 11/05/2019 0812     Review of Systems is noted in the HPI, as appropriate  Objective:   BP (!) 160/104   Pulse 100   Temp 98.8 F (37.1 C) (Temporal)   Ht 5' 10.5" (1.791 m)   Wt 263 lb 3 oz (119.4 kg)   LMP 12/16/2018   SpO2 97%   BMI 37.23 kg/m   GEN: No acute distress; alert,appropriate. PULM: Breathing comfortably in no respiratory distress PSYCH: Normally interactive.  CV: RRR, no m/g/r   Laboratory and Imaging Data: Results for orders placed or performed in visit on 05/17/20  POCT glycosylated hemoglobin (Hb A1C)  Result Value Ref Range   Hemoglobin A1C 7.1 (A) 4.0 - 5.6 %   HbA1c POC (<> result, manual entry)     HbA1c, POC (prediabetic range)     HbA1c, POC (controlled diabetic range)       Assessment and Plan:     ICD-10-CM   1. Diabetes mellitus type 2 in obese (HCC)  E11.69 POCT glycosylated hemoglobin (Hb A1C)   E66.9   2. Essential hypertension  I10   3. Nausea  R11.0 ondansetron (ZOFRAN ODT) 4 MG disintegrating tablet   BS have normalized  BP is very much not in control. D/c HCTZ due to  cramps and add Norvasc  Patient Instructions  Check your blood pressure twice a week.   Give it 6 weeks.  If above 140/90, let me know in a mychart message.     Meds ordered this encounter  Medications  . ondansetron (ZOFRAN ODT) 4 MG disintegrating tablet    Sig: Take 1 tablet (4 mg total) by mouth every 6 (six) hours as needed for nausea.    Dispense:  30 tablet    Refill:  1  . scopolamine (TRANSDERM-SCOP, 1.5 MG,) 1 MG/3DAYS    Sig: Place 1 patch (1.5 mg total) onto the skin every 3 (three) days.    Dispense:  10 patch    Refill:  0  . amLODipine (NORVASC) 10 MG tablet    Sig: Take 1 tablet (10 mg total) by mouth daily.    Dispense:  90 tablet    Refill:  3    Medications Discontinued During This Encounter  Medication Reason  . NEOMYCIN-POLYMYXIN-HYDROCORTISONE (CORTISPORIN) 1 % SOLN OTIC solution Completed Course  . hydrochlorothiazide (HYDRODIURIL) 12.5 MG tablet   . ondansetron (ZOFRAN ODT) 4 MG disintegrating tablet Reorder   Orders Placed This Encounter  Procedures  . POCT glycosylated hemoglobin (Hb A1C)    Follow-up: No follow-ups on file.  Signed,  Elpidio Galea. Floyed Masoud, MD   Outpatient Encounter Medications as of 05/17/2020  Medication Sig  . amLODipine (NORVASC) 10 MG tablet Take 1 tablet (10 mg total) by mouth daily.  Marland Kitchen atorvastatin (LIPITOR) 20 MG tablet Take 1 tablet (20 mg total) by mouth daily.  Marland Kitchen glipiZIDE (GLUCOTROL XL) 10 MG 24 hr tablet TAKE 1 TABLET(10 MG) BY MOUTH DAILY WITH BREAKFAST  . losartan (COZAAR) 100 MG tablet TAKE 1 TABLET(100 MG) BY MOUTH DAILY  . metFORMIN (GLUCOPHAGE-XR) 500 MG 24 hr tablet Take two tablets by mouth at breakfast  . Multiple Vitamin (MULTIVITAMIN) tablet Take 1 tablet by mouth daily.  Marland Kitchen scopolamine (TRANSDERM-SCOP, 1.5 MG,) 1 MG/3DAYS Place 1 patch (1.5 mg total) onto the skin every 3 (three) days.  . [DISCONTINUED] ondansetron (ZOFRAN ODT) 4 MG disintegrating tablet Take 1 tablet (4 mg total) by mouth every 6 (six) hours as needed for nausea.  . ondansetron (ZOFRAN ODT) 4 MG disintegrating tablet Take 1 tablet (4 mg total) by mouth every 6 (six) hours as needed for nausea.  . [DISCONTINUED] hydrochlorothiazide (HYDRODIURIL) 12.5 MG tablet Take 1 tablet (12.5 mg total) by mouth daily. (Patient not taking: No sig reported)  . [DISCONTINUED] NEOMYCIN-POLYMYXIN-HYDROCORTISONE (CORTISPORIN) 1 % SOLN OTIC solution Place 3 drops into the left ear every 6 (six) hours.   No facility-administered encounter medications on file as of 05/17/2020.

## 2020-05-17 ENCOUNTER — Ambulatory Visit (INDEPENDENT_AMBULATORY_CARE_PROVIDER_SITE_OTHER): Payer: 59 | Admitting: Family Medicine

## 2020-05-17 ENCOUNTER — Other Ambulatory Visit: Payer: Self-pay

## 2020-05-17 ENCOUNTER — Encounter: Payer: Self-pay | Admitting: Family Medicine

## 2020-05-17 VITALS — BP 160/104 | HR 100 | Temp 98.8°F | Ht 70.5 in | Wt 263.2 lb

## 2020-05-17 DIAGNOSIS — R110 Nausea: Secondary | ICD-10-CM

## 2020-05-17 DIAGNOSIS — I10 Essential (primary) hypertension: Secondary | ICD-10-CM

## 2020-05-17 DIAGNOSIS — E669 Obesity, unspecified: Secondary | ICD-10-CM | POA: Diagnosis not present

## 2020-05-17 DIAGNOSIS — E1169 Type 2 diabetes mellitus with other specified complication: Secondary | ICD-10-CM

## 2020-05-17 LAB — POCT GLYCOSYLATED HEMOGLOBIN (HGB A1C): Hemoglobin A1C: 7.1 % — AB (ref 4.0–5.6)

## 2020-05-17 MED ORDER — ONDANSETRON 4 MG PO TBDP: 4.0000 mg | ORAL_TABLET | Freq: Four times a day (QID) | ORAL | 1 refills | Status: DC | PRN

## 2020-05-17 MED ORDER — AMLODIPINE BESYLATE 10 MG PO TABS: 10.0000 mg | ORAL_TABLET | Freq: Every day | ORAL | 3 refills | Status: DC

## 2020-05-17 MED ORDER — SCOPOLAMINE 1 MG/3DAYS TD PT72: 1.0000 | MEDICATED_PATCH | TRANSDERMAL | 0 refills | Status: DC

## 2020-05-17 NOTE — Patient Instructions (Signed)
Check your blood pressure twice a week.   Give it 6 weeks.  If above 140/90, let me know in a mychart message.

## 2020-06-14 ENCOUNTER — Other Ambulatory Visit: Payer: Self-pay

## 2020-06-14 ENCOUNTER — Encounter: Payer: Self-pay | Admitting: Family Medicine

## 2020-06-14 ENCOUNTER — Ambulatory Visit (INDEPENDENT_AMBULATORY_CARE_PROVIDER_SITE_OTHER): Payer: 59 | Admitting: Family Medicine

## 2020-06-14 VITALS — BP 143/92 | HR 112 | Ht 73.0 in | Wt 265.2 lb

## 2020-06-14 DIAGNOSIS — Z01419 Encounter for gynecological examination (general) (routine) without abnormal findings: Secondary | ICD-10-CM | POA: Diagnosis not present

## 2020-06-16 ENCOUNTER — Encounter: Payer: Self-pay | Admitting: Family Medicine

## 2020-06-16 NOTE — Progress Notes (Signed)
   GYNECOLOGY ANNUAL PREVENTATIVE CARE ENCOUNTER NOTE  Subjective:   Carrie Ellis is a 55 y.o. [id number] female here for a routine annual gynecologic exam.  Current complaints: none.   Denies abnormal vaginal bleeding, discharge, pelvic pain, problems with intercourse or other gynecologic concerns.    Gynecologic History Patient's last menstrual period was 09/24/2019 (exact date). Contraception: none Last Pap: WNL- UTD- last in 2021 Last mammogram: 2021. Results were: normal  The following portions of the patient's history were reviewed and updated as appropriate: allergies, current medications, past family history, past medical history, past social history, past surgical history and problem list.  Review of Systems Pertinent items are noted in HPI.   Objective:  BP (!) 143/92   Pulse (!) 112   Ht 6\' 1"  (1.854 m)   Wt 265 lb 3.2 oz (120.3 kg)   LMP 09/24/2019 (Exact Date)   BMI 34.99 kg/m  CONSTITUTIONAL: Well-developed, well-nourished female in no acute distress.  HENT:  Normocephalic, atraumatic, External right and left ear normal. Oropharynx is clear and moist EYES:  No scleral icterus.  NECK: Normal range of motion, supple, no masses.  Normal thyroid.  SKIN: Skin is warm and dry. No rash noted. Not diaphoretic. No erythema. No pallor. NEUROLOGIC: Alert and oriented to person, place, and time. Normal reflexes, muscle tone coordination. No cranial nerve deficit noted. PSYCHIATRIC: Normal mood and affect. Normal behavior. Normal judgment and thought content. CARDIOVASCULAR: Normal heart rate noted, regular rhythm. 2+ distal pulses. RESPIRATORY: Effort and breath sounds normal, no problems with respiration noted. BREASTS: Symmetric in size. No masses, skin changes, nipple drainage, or lymphadenopathy. ABDOMEN: Soft,  no distention noted.  No tenderness, rebound or guarding.  PELVIC: not indicated MUSCULOSKELETAL: Normal range of motion.    Assessment and Plan:  1)  Annual gynecologic examination without pap smear:  Will follow up results of pap smear and manage accordingly.   Routine preventative health maintenance measures emphasized.  1. Well woman exam with routine gynecological exam Reviewed health maintenance Applauded increased exercise  Reviewed kinesthetic exercise to help with regimen. She is currently doing cardio 3-4 day per week Planning on cruise in may!! Colon cancer screening UTD - MM 3D SCREEN BREAST BILATERAL; Future   Please refer to After Visit Summary for other counseling recommendations.   Return in about 1 year (around 06/14/2021) for Yearly wellness exam.  08/14/2021, MD, MPH, ABFM Attending [facility] for [facility]

## 2020-07-06 LAB — HM DIABETES EYE EXAM

## 2020-07-07 DIAGNOSIS — E119 Type 2 diabetes mellitus without complications: Secondary | ICD-10-CM | POA: Diagnosis not present

## 2020-07-14 ENCOUNTER — Telehealth: Payer: Self-pay | Admitting: Family Medicine

## 2020-07-14 NOTE — Telephone Encounter (Signed)
Mr. Carrie Ellis called and stated that his wife was given a 65 supply but the pharmacy only gave them 60 and she is out of medication, and when they called the pharmacy they told her to call her provider. Its for the Amlodipine   LAST APPOINTMENT DATE: 07/08/2020   NEXT APPOINTMENT DATE:@10 /06/2020  MEDICATION: amlodipine  PHARMACY: [id number]   Let patient know to contact pharmacy at the end of the day to make sure medication is ready.  Please notify patient to allow 48-72 hours to process  Encourage patient to contact the pharmacy for refills or they can request refills through [facility]  CLINICAL FILLS OUT ALL BELOW:   LAST REFILL:  QTY:  REFILL DATE:    OTHER COMMENTS:    Okay for refill?  Please advise

## 2020-07-14 NOTE — Telephone Encounter (Signed)
Completed in another encounter.

## 2020-07-14 NOTE — Telephone Encounter (Signed)
Can you call [facility] for the patient.  I sent in Norvasc 10 mg, #90 with 3 refills on the above date.  I don't completely understand the question.  She should have a full year's supply available.

## 2020-07-15 NOTE — Telephone Encounter (Signed)
Called pharmacy have refills but she had ran out early. She does have refills left and can get early but will need to pay cash price. I have called patient and let her know that. She will pay out of pocket for refill for 30 days per patient request I have called pharmacy and let them know to get 30 refill ready for her to pick up.

## 2020-07-28 ENCOUNTER — Other Ambulatory Visit: Payer: Self-pay | Admitting: Family Medicine

## 2020-08-24 DIAGNOSIS — Z20822 Contact with and (suspected) exposure to covid-19: Secondary | ICD-10-CM | POA: Diagnosis not present

## 2020-09-05 NOTE — Progress Notes (Signed)
Carrie Chaney T. Meili Kleckley, MD, CAQ Sports Medicine [facility] at [facility] [address] [place], [address]  Phone: [phone]  FAX: [phone]  Carrie Ellis - 55 y.o. female  MRN [id number]  Date of Birth: March 03, 1965  Date: 09/06/2020  PCP: Hannah Beat, MD  Referral: Hannah Beat, MD  Chief Complaint  Patient presents with   Chest Wall Pain    Left-Started hurting after daughters help pull her up off air mattress    This visit occurred during the SARS-CoV-2 public health emergency.  Safety protocols were in place, including screening questions prior to the visit, additional usage of staff PPE, and extensive cleaning of exam room while observing appropriate contact time as indicated for disinfecting solutions.   Subjective:   Carrie TERCERO is a 55 y.o. very pleasant female patient with Body mass index is 36.71 kg/m. who presents with the following:  L sided chest wall pain for 2 weeks. Husband tested positive for covid, and then 12th had a negative test.  Isolated her husband.  Put up her mattress had to get up and had a dificult time getting up.  Rocked up.  Then later on I the night started to have a tug of war all isolated to the left anterior chest wall.  Has been ongoing since then.    Tried some tylenol.  Did not help.  Tried a salonpas.    Review of Systems is noted in the HPI, as appropriate   Objective:   BP 120/80   Pulse (!) 107   Temp 98 F (36.7 C) (Temporal)   Ht 5' 10.5" (1.791 m)   Wt 259 lb 8 oz (117.7 kg)   SpO2 98%   BMI 36.71 kg/m   GEN: No acute distress; alert,appropriate. PULM: Breathing comfortably in no respiratory distress PSYCH: Normally interactive.    Left anterior chest wall with tenderness to palpation.  She also has some pain with sternal compression.  Inferior is on the left as well as all right-sided chest pain is nontender. This portion of the physical examination was  chaperoned by Terese Door, CMA.   Shoulder and elbow exams are entirely normal with full range of motion and strength.  Radiology: No results found.  Assessment and Plan:     ICD-10-CM   1. Costochondral chest pain  R07.89      Acute musculoskeletal chest pain with intercostal muscle injury.  This should do well and heal on its own.  Scheduled NSAIDs for now.    Signed,  Elpidio Galea. Kailer Heindel, MD   Outpatient Encounter Medications as of 09/06/2020  Medication Sig   amLODipine (NORVASC) 10 MG tablet Take 1 tablet (10 mg total) by mouth daily.   atorvastatin (LIPITOR) 20 MG tablet Take 1 tablet (20 mg total) by mouth daily.   glipiZIDE (GLUCOTROL XL) 10 MG 24 hr tablet TAKE 1 TABLET(10 MG) BY MOUTH DAILY WITH BREAKFAST   metFORMIN (GLUCOPHAGE-XR) 500 MG 24 hr tablet Take 2 tablets (1,000 mg total) by mouth daily with breakfast.   Multiple Vitamin (MULTIVITAMIN) tablet Take 1 tablet by mouth daily.   ondansetron (ZOFRAN ODT) 4 MG disintegrating tablet Take 1 tablet (4 mg total) by mouth every 6 (six) hours as needed for nausea.   losartan (COZAAR) 100 MG tablet TAKE 1 TABLET(100 MG) BY MOUTH DAILY (Patient not taking: No sig reported)   [DISCONTINUED] scopolamine (TRANSDERM-SCOP, 1.5 MG,) 1 MG/3DAYS Place 1 patch (1.5 mg total) onto the skin every 3 (  three) days.   No facility-administered encounter medications on file as of 09/06/2020.

## 2020-09-06 ENCOUNTER — Other Ambulatory Visit: Payer: Self-pay

## 2020-09-06 ENCOUNTER — Ambulatory Visit (INDEPENDENT_AMBULATORY_CARE_PROVIDER_SITE_OTHER): Payer: 59 | Admitting: Family Medicine

## 2020-09-06 ENCOUNTER — Encounter: Payer: Self-pay | Admitting: Family Medicine

## 2020-09-06 VITALS — BP 120/80 | HR 107 | Temp 98.0°F | Ht 70.5 in | Wt 259.5 lb

## 2020-09-06 DIAGNOSIS — R0789 Other chest pain: Secondary | ICD-10-CM

## 2020-09-06 NOTE — Patient Instructions (Signed)
You should be able to take generic ibuprofen orally. (Over the counter Motrin, Advil, or Generic Ibuprofen 200 mg tablets. 3-4 tablets by mouth 3 times a day. This equals a prescription strength dose.)  

## 2020-10-04 DIAGNOSIS — M940 Chondrocostal junction syndrome [Tietze]: Secondary | ICD-10-CM | POA: Diagnosis not present

## 2020-11-15 ENCOUNTER — Ambulatory Visit: Payer: 59

## 2020-11-17 ENCOUNTER — Other Ambulatory Visit: Payer: Self-pay

## 2020-11-17 ENCOUNTER — Ambulatory Visit: Payer: 59 | Admitting: Family Medicine

## 2020-11-17 ENCOUNTER — Ambulatory Visit
Admission: RE | Admit: 2020-11-17 | Discharge: 2020-11-17 | Disposition: A | Discharge status: Home / Self Care | Payer: 59 | Source: Ambulatory Visit | Attending: Family Medicine | Admitting: Family Medicine

## 2020-11-17 DIAGNOSIS — Z01419 Encounter for gynecological examination (general) (routine) without abnormal findings: Secondary | ICD-10-CM

## 2020-12-07 ENCOUNTER — Other Ambulatory Visit: Payer: Self-pay

## 2020-12-07 ENCOUNTER — Encounter: Payer: Self-pay | Admitting: Internal Medicine

## 2020-12-07 ENCOUNTER — Ambulatory Visit (INDEPENDENT_AMBULATORY_CARE_PROVIDER_SITE_OTHER): Payer: 59 | Admitting: Internal Medicine

## 2020-12-07 VITALS — BP 128/88 | HR 101 | Ht 70.5 in | Wt 256.2 lb

## 2020-12-07 DIAGNOSIS — E0590 Thyrotoxicosis, unspecified without thyrotoxic crisis or storm: Secondary | ICD-10-CM | POA: Diagnosis not present

## 2020-12-07 DIAGNOSIS — E042 Nontoxic multinodular goiter: Secondary | ICD-10-CM

## 2020-12-07 LAB — T4, FREE: Free T4: 1.15 ng/dL (ref 0.60–1.60)

## 2020-12-07 LAB — TSH: TSH: 1.59 u[IU]/mL (ref 0.35–5.50)

## 2020-12-07 LAB — T3, FREE: T3, Free: 3 pg/mL (ref 2.3–4.2)

## 2020-12-07 NOTE — Patient Instructions (Addendum)
Please stop at the lab.  Hold the multivitamin in the day we check labs.  Please come back for a follow-up appointment in 1 year.

## 2020-12-07 NOTE — Progress Notes (Addendum)
Patient ID: Carrie Ellis, female   DOB: March 18, 1965, 55 y.o.   MRN: [id number]   This visit occurred during the SARS-CoV-2 public health emergency.  Safety protocols were in place, including screening questions prior to the visit, additional usage of staff PPE, and extensive cleaning of exam room while observing appropriate contact time as indicated for disinfecting solutions.   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  Last visit 1 year ago.  Interim hx: She is feeling well at this visit, without complaints.   No tremors, palpitations, heat intolerance.  Reviewed history: Pt has had a low TSH since 1990s >> saw Dr. Margaretmary Ellis >> was on medications >> then TFTs normalized >> stopped the medication (cannot remember name).   After this, she started to see Dr. Dallas Ellis and her TFTs started to become abnormal again in 2017.  Reviewed her TFTs: Lab Results  Component Value Date   TSH 1.20 12/04/2019   TSH 1.14 06/04/2019   TSH 1.44 03/31/2019   TSH 0.81 01/31/2019   TSH <0.01 (L) 12/12/2018   TSH <0.01 (L) 08/08/2018   TSH <0.01 (L) 01/31/2018   TSH 0.08 (L) 07/10/2017   TSH 0.05 (L) 03/12/2017   TSH 0.03 (L) 11/09/2016   FREET4 1.09 12/04/2019   FREET4 0.99 06/04/2019   FREET4 0.95 03/31/2019   FREET4 0.91 01/31/2019   FREET4 1.25 12/12/2018   FREET4 1.02 08/08/2018   FREET4 1.17 01/31/2018   FREET4 0.87 07/10/2017   FREET4 1.00 03/12/2017   FREET4 1.00 11/09/2016   Lab Results  Component Value Date   T3FREE 3.4 12/04/2019   T3FREE 3.3 06/04/2019   T3FREE 3.1 03/31/2019   T3FREE 3.0 01/31/2019   T3FREE 3.3 12/12/2018   T3FREE 3.8 08/08/2018   T3FREE 4.3 (H) 01/31/2018   T3FREE 3.3 07/10/2017   T3FREE 3.4 03/12/2017   T3FREE 3.4 11/09/2016   2011: TSH 0.29, normal free hormones  Her TSI antibodies were not elevated: Lab Results   Component Value Date   TSI <89 11/09/2016   Reviewed the rest of the investigation and management: Thyroid ultrasound (04/20/2006 compared with 05/13/2004) showed multinodular goiter with slight interval increase in size of the right inferior thyroid nodule and left inferior thyroid nodule, but otherwise stable size of the nodules, with slightly enlarged thyroid.  Thyroid uptake and scan (11/28/2016): She had toxic bilateral thyroid adenomas. Hyper functional BILATERAL thyroid nodules with suppression of uptake in remaining thyroid tissue compatible with hyper functional thyroid adenomas. 24 hour radio iodine uptake of 26%.  She had a hard time deciding whether to have the RAI treatment or not and she finally decided for this in 2020:  Thyroid uptake (09/24/2018): elevated: 21% (5 to 15%), 37.2% at 24 hours (10 to 30%).  RAI treatment (11/04/2018): 31.2 mCi  Thyroid ultrasound (12/17/2019): Parenchymal Echotexture: Moderately heterogenous Isthmus: 0.9 cm Right lobe: 5.6 cm x 2.0 cm x 2.1 cm Left lobe: 6.1 cm x 1.4 cm x 2.2 cm __________________________________________________________________   Nodule # 1: Location: Right; Inferior Maximum size: 3.0 cm; Other 2 dimensions: 1.8 cm x 1.5 cm. This nodule has decreased in size from the prior of 3.9 cm in 2008  Composition: mixed cystic and solid (1) Echogenicity: hypoechoic (2) ACR TI-RADS recommendations: Nodule meets criteria for biopsy  ________________________________________________________   Nodule # 2: Location: Isthmus; mid Maximum size: 1.1 cm; Other 2 dimensions: 0.9 cm x  0.8 cm Composition: solid/almost completely solid (2) Echogenicity: hyperechoic (1) Shape: taller-than-wide (3) ACR TI-RADS recommendations: Nodule meets criteria for surveillance _________________________________________________________   Nodule # 3:  Location: Isthmus; mid Maximum size: 1.4 cm; Other 2 dimensions: 1.2 cm x 0.8 cm Composition:  cannot determine (2)  Echogenicity: hypoechoic (2) ACR TI-RADS recommendations: Nodule meets criteria for surveillance _________________________________________________________   Nodule # 4:  Location: Left; Superior  Maximum size: 0.8 cm; Other 2 dimensions: 0.7 cm x 0.4 cm Composition: cannot determine (2) Echogenicity: isoechoic (1) ACR TI-RADS recommendations: Nodule does not meet criteria for surveillance or biopsy  ________________________________________________________   Nodule # 5: Location: Left; Mid Maximum size: 0.6 cm; Other 2 dimensions: 0.6 cm x 0.6 cm Composition: cannot determine (2) Echogenicity: hypoechoic (2) ACR TI-RADS recommendations:  Nodule does not meet criteria for surveillance or biopsy _________________________________________________________   No adenopathy   IMPRESSION: Multinodular thyroid.   Right inferior nodule, (labeled 1, 3.0 cm, TR 3) if a thyroid nodule and not parathyroid, does meet criteria for biopsy. However, the nodule has decreased to 3.0 cm from a prior 3.9 cm, suggesting benign natural history.  If there is motivation for biopsy, would also suggest consideration first of nuclear medicine sestamibi scan, as this could represent a parathyroid gland/adenoma given the location and appearance, versus biopsy.   Isthmic thyroid nodule (labeled 2, 1.1 cm, TR 4) and the isthmic nodule (labeled 3, 1.4 cm, TR 4) both meet criteria for surveillance, as designated by the newly established ACR TI-RADS criteria. Surveillance ultrasound study recommended to be performed annually up to 5 years.  Thyroid nodule FNA (12/25/2019):  A. THYROID, RLP, FINE NEEDLE ASPIRATION:   FINAL MICROSCOPIC DIAGNOSIS:  - Consistent with benign follicular nodule (Bethesda category II)   SPECIMEN ADEQUACY:  Satisfactory for evaluation   Pt denies: - feeling nodules in neck - hoarseness - dysphagia - choking - SOB with lying down  Pt does have a FH  of thyroid ds - mother (hypothyroidism). No FH of thyroid cancer. No h/o radiation tx to head or neck except for RAI treatment 11/04/2018.  No herbal supplements. No Biotin use. No recent steroids use.   Pt. also has a history of HL, HTN, GERD, diabetes (latest HbA1c was 7.1%  << 8% << 7.6% in 04/2019 -managed by PCP - on Glipizide ER, Metformin).  She takes a multivitamin with Biotin 150 mcg daily.  ROS: + see HPI  I reviewed pt's medications, allergies, PMH, social hx, family hx, and changes were documented in the history of present illness. Otherwise, unchanged from my initial visit note.  Past Medical History:  Diagnosis Date   Diabetes mellitus type 2 in obese (HCC) 04/14/2015   Hypertension    Hypothyroid    Positive PPD, treated    9 months of treatment   Past Surgical History:  Procedure Laterality Date   CHOLECYSTECTOMY  1999   TONSILLECTOMY  1995   Social History   Social History   Marital status: Married    Spouse name: N/A   Number of children: 2   Occupational History   Production designer, theatre/television/film Tech [employer]   Social History Main Topics   Smoking status: Never Smoker   Smokeless tobacco: Never Used   Alcohol use No   Drug use: No   Sexual activity: Not Currently    Partners: Male    Birth control/ protection: OCP, Other-see comments     Comment: husband had vasectomy  Current Outpatient Medications on File Prior to Visit  Medication Sig Dispense Refill   amLODipine (NORVASC) 10 MG tablet Take 1 tablet (10 mg total) by mouth daily. 90 tablet 3   atorvastatin (LIPITOR) 20 MG tablet Take 1 tablet (20 mg total) by mouth daily. 90 tablet 3   glipiZIDE (GLUCOTROL XL) 10 MG 24 hr tablet TAKE 1 TABLET(10 MG) BY MOUTH DAILY WITH BREAKFAST 30 tablet 5   losartan (COZAAR) 100 MG tablet TAKE 1 TABLET(100 MG) BY MOUTH DAILY (Patient not taking: No sig reported) 90 tablet 1   metFORMIN (GLUCOPHAGE-XR) 500 MG 24 hr tablet Take 2  tablets (1,000 mg total) by mouth daily with breakfast. 180 tablet 1   Multiple Vitamin (MULTIVITAMIN) tablet Take 1 tablet by mouth daily.     ondansetron (ZOFRAN ODT) 4 MG disintegrating tablet Take 1 tablet (4 mg total) by mouth every 6 (six) hours as needed for nausea. 30 tablet 1   No current facility-administered medications on file prior to visit.   Allergies  Allergen Reactions   Penicillins     REACTION: rash   Ace Inhibitors Cough   Clarithromycin Nausea Only    REACTION: Extreme nausea   Diclofenac Nausea Only   Family History  Problem Relation Age of Onset   Stroke Other    Diabetes Other    Cancer Mother    Heart disease Mother    Alcohol abuse Neg Hx    Mental illness Neg Hx    PE: BP 128/88 (BP Location: Right Arm, Patient Position: Sitting, Cuff Size: Normal)   Pulse (!) 101   Ht 5' 10.5" (1.791 m)   Wt 256 lb 3.2 oz (116.2 kg)   SpO2 98%   BMI 36.24 kg/m   Wt Readings from Last 3 Encounters:  12/07/20 256 lb 3.2 oz (116.2 kg)  09/06/20 259 lb 8 oz (117.7 kg)  06/14/20 265 lb 3.2 oz (120.3 kg)   Constitutional: overweight, in NAD Eyes: PERRLA, EOMI, no exophthalmos ENT: moist mucous membranes, no thyromegaly, no cervical lymphadenopathy Cardiovascular: tachycardia, RR, No MRG Respiratory: CTA B Gastrointestinal: abdomen soft, NT, ND, BS+ Musculoskeletal: no deformities, strength intact in all 4 Skin: moist, warm, no rashes Neurological: no tremor with outstretched hands, DTR normal in all 4  ASSESSMENT: 1. Subclinical Thyrotoxicosis  2. MNG  3. DM2 -per PCP  PLAN:  1. Patient with a long history of subclinical thyrotoxicosis without thyrotoxic symptoms: No weight loss, heat intolerance, palpitations, anxiety.  She does have whitecoat tachycardia.  Her heart rate checked at home with her Fitbit is usually not elevated. -Her thyroid ultrasound showed a multinodular goiter with a thyroid uptake and scan showing that 2 of the thyroid nodules are  hyperfunctioning.  She had RAI treatment on 11/04/2018.  She felt better after the treatment and her TFTs normalized so we did not have to start levothyroxine. -At last visit, we rechecked her TFTs and they were normal -in 11/2019  -We will repeat these today; she took her multivitamin today, but this only contains 150 mcg of biotin, which is a low dose. -We discussed about how to take levothyroxine correctly in case we need to start.  She is taking multivitamins, and will need to separate these by at least 4 hours from levothyroxine. -We will see her back in a year but possibly sooner for labs  2. MNG -The nodules appear to be hyperfunctioning on the thyroid uptake and scan, but she had RAI treatment for these -No neck compression  symptoms -I reviewed the results of her thyroid ultrasound from 12/17/2019: An FNA was recommended for the right inferior nodule, and follow-up was needed for her isthmic nodule -We biopsied these nodules on 12/25/2019 and this was benign. -We will repeat her thyroid ultrasound now to follow-up on the rest of the nodules.  3. DM2 - managed by PCP - HbA1c checked accidentally today >> 9.3% (much higher) -At this visit, we discussed about the fact that her diabetes worsened since last HbA1c checked. -She is checking blood sugars at home but not in the last 2 weeks.  I gave her blood sugar logs and advised her to check consistently. -Upon questioning, she would prefer to have her PCP manage her diabetes.  She has an appointment with him coming up.  I advised him to take the blood sugar logs at this visit.  Component     Latest Ref Rng & Units 12/07/2020  TSH     0.35 - 5.50 uIU/mL 1.59  T4,Free(Direct)     0.60 - 1.60 ng/dL 4.40  Triiodothyronine,Free,Serum     2.3 - 4.2 pg/mL 3.0  Thyroid tests are excellent.  No need for levothyroxine.  Thyroid U/S (12/24/2020): Parenchymal Echotexture: Mildly heterogeneous  Isthmus: 0.6 cm  Right lobe: 5.0 x 1.6 x 2.0 cm   Left lobe: 6.1 x 1.6 x 1.9 cm _______________________________________________________________________   Nodule 1: 0.9 x 0.8 x 0.6 cm solid isoechoic nodule within the isthmus is slightly smaller since prior examination. It is not meet criteria for FNA or imaging surveillance.   _________________________________________________________   Nodule 2: 0.9 x 0.9 x 0.7 cm solid hypoechoic nodule in the inferior left thyroid lobe has decreased in size since the prior examination and does not meet criteria for FNA or imaging surveillance.   _________________________________________________________   Nodule 3: 2.7 x 1.6 x 1.0 cm right inferior thyroid nodule is not significantly changed in size since prior examination. Prior FNA was performed on 12/25/2019. Please correlate with FNA results.   _________________________________________________________   Nodule 4: 0.9 x 0.6 x 0.5 cm isoechoic solid nodule in the superior left thyroid lobe is not significantly changed in size since prior examination. It does not meet criteria for imaging surveillance or FNA.   _________________________________________________________   Nodule 5: 0.7 x 0.7 x 0.6 cm solid hypoechoic nodule in the mid left thyroid lobe does not demonstrate threshold growth since 04/20/2006 where it measured 1.1 x 1.0 x 0.6 cm. This is consistent with a benign etiology.   IMPRESSION: 1. Previously biopsied right inferior thyroid nodule is not significantly changed in size. Please correlate with prior FNA results. 2. Remaining thyroid nodules do not meet criteria for FNA or imaging surveillance.   The above is in keeping with the ACR TI-RADS recommendations - J Am Coll Radiol 2017;14:587-595.     Electronically Signed   By: Acquanetta Belling M.D.   On: 12/24/2020 15:30  Carlus Pavlov, MD PhD [facility] Endocrinology

## 2020-12-20 ENCOUNTER — Other Ambulatory Visit: Payer: Self-pay

## 2020-12-20 ENCOUNTER — Ambulatory Visit (INDEPENDENT_AMBULATORY_CARE_PROVIDER_SITE_OTHER): Payer: 59 | Admitting: Family Medicine

## 2020-12-20 ENCOUNTER — Encounter: Payer: Self-pay | Admitting: Family Medicine

## 2020-12-20 VITALS — BP 114/76 | HR 86 | Temp 97.4°F | Ht 70.5 in | Wt 255.5 lb

## 2020-12-20 DIAGNOSIS — E1169 Type 2 diabetes mellitus with other specified complication: Secondary | ICD-10-CM

## 2020-12-20 DIAGNOSIS — I10 Essential (primary) hypertension: Secondary | ICD-10-CM | POA: Diagnosis not present

## 2020-12-20 DIAGNOSIS — E669 Obesity, unspecified: Secondary | ICD-10-CM

## 2020-12-20 LAB — POCT GLYCOSYLATED HEMOGLOBIN (HGB A1C): Hemoglobin A1C: 9.4 % — AB (ref 4.0–5.6)

## 2020-12-20 MED ORDER — METFORMIN HCL ER 500 MG PO TB24: 2000.0000 mg | ORAL_TABLET | Freq: Every day | ORAL | 1 refills | Status: DC

## 2020-12-20 NOTE — Patient Instructions (Signed)
With your Metformin, increase to 3 tablets for 2 weeks, then increase to 4 tablets a day.

## 2020-12-20 NOTE — Progress Notes (Signed)
Carrie Adamski T. Neita Landrigan, MD, CAQ Sports Medicine [facility] at [facility] [address] [place], [address]  Phone: [phone]  FAX: [phone]  Carrie Ellis - 55 y.o. female  MRN [id number]  Date of Birth: 1965/03/01  Date: 12/20/2020  PCP: Hannah Beat, MD  Referral: Hannah Beat, MD  Chief Complaint  Patient presents with   Diabetes   Hypertension    This visit occurred during the SARS-CoV-2 public health emergency.  Safety protocols were in place, including screening questions prior to the visit, additional usage of staff PPE, and extensive cleaning of exam room while observing appropriate contact time as indicated for disinfecting solutions.   Subjective:   Carrie Ellis is a 55 y.o. very pleasant female patient with Body mass index is 36.14 kg/m. who presents with the following:  Diabetes Mellitus: Tolerating Medications: yes Compliance with diet: fair/poor Body mass index is 36.14 kg/m. Exercise: minimal / intermittent Avg blood sugars at home: not checking Foot problems: none Hypoglycemia: none No nausea, vomitting, blurred vision, polyuria.  Kim's not been doing particularly well with her diet.  She understands that she has been eating a lot of carbs, and she has been eating cookies more than normal.  She has been quite compliant with her medication.  Historically, her blood sugars have been very well controlled just on 1000 g of metformin and 10 mg of glipizide.  Lab Results  Component Value Date   HGBA1C 9.4 (A) 12/20/2020   HGBA1C 7.1 (A) 05/17/2020   HGBA1C 8.3 (H) 11/05/2019   Lab Results  Component Value Date   MICROALBUR 17.1 (H) 11/05/2019   LDLCALC 77 11/05/2019   CREATININE 0.83 11/05/2019    Wt Readings from Last 3 Encounters:  12/20/20 255 lb 8 oz (115.9 kg)  12/07/20 256 lb 3.2 oz (116.2 kg)  09/06/20 259 lb 8 oz (117.7 kg)     HTN: Tolerating all medications without side  effects Stable and at goal No CP, no sob. No HA.  BP Readings from Last 3 Encounters:  12/20/20 114/76  12/07/20 128/88  09/06/20 120/80    Basic Metabolic Panel:    Component Value Date/Time   NA 138 11/05/2019 0812   K 3.9 11/05/2019 0812   CL 104 11/05/2019 0812   CO2 26 11/05/2019 0812   BUN 9 11/05/2019 0812   CREATININE 0.83 11/05/2019 0812   CREATININE 0.79 09/18/2016 1117   GLUCOSE 167 (H) 11/05/2019 0812   CALCIUM 9.1 11/05/2019 0812     Review of Systems is noted in the HPI, as appropriate  Objective:   BP 114/76   Pulse 86   Temp (!) 97.4 F (36.3 C) (Temporal)   Ht 5' 10.5" (1.791 m)   Wt 255 lb 8 oz (115.9 kg)   SpO2 98%   BMI 36.14 kg/m   GEN: No acute distress; alert,appropriate. PULM: Breathing comfortably in no respiratory distress PSYCH: Normally interactive.  CV: RRR, no m/g/r   Laboratory and Imaging Data: Results for orders placed or performed in visit on 12/20/20  POCT glycosylated hemoglobin (Hb A1C)  Result Value Ref Range   Hemoglobin A1C 9.4 (A) 4.0 - 5.6 %   HbA1c POC (<> result, manual entry)     HbA1c, POC (prediabetic range)     HbA1c, POC (controlled diabetic range)       Assessment and Plan:     ICD-10-CM   1. Diabetes mellitus type 2 in obese (HCC)  E11.69 POCT glycosylated  hemoglobin (Hb A1C)   E66.9     2. Essential hypertension  I10      Blood pressure, stable.  Diabetes, fair to poor control.  She does have some glucometer readings that are often above 180, multiple in the low 200s.  I think that we have to change her medication, and she is only on a relatively low-dose of metformin, so we will increase this up to 2000 mg.  Patient Instructions  With your Metformin, increase to 3 tablets for 2 weeks, then increase to 4 tablets a day.   Meds ordered this encounter  Medications   metFORMIN (GLUCOPHAGE-XR) 500 MG 24 hr tablet    Sig: Take 4 tablets (2,000 mg total) by mouth daily with breakfast.    Dispense:   360 tablet    Refill:  1   Medications Discontinued During This Encounter  Medication Reason   metFORMIN (GLUCOPHAGE-XR) 500 MG 24 hr tablet    Orders Placed This Encounter  Procedures   POCT glycosylated hemoglobin (Hb A1C)    Follow-up: Return in about 3 months (around 03/22/2021) for Physical .  Dragon [employer] One speech-to-text software was used for transcription in this dictation.  Possible transcriptional errors can occur using Animal nutritionist.   Signed,  Elpidio Galea. Cora Stetson, MD   Outpatient Encounter Medications as of 12/20/2020  Medication Sig   amLODipine (NORVASC) 10 MG tablet Take 1 tablet (10 mg total) by mouth daily.   atorvastatin (LIPITOR) 20 MG tablet Take 1 tablet (20 mg total) by mouth daily.   glipiZIDE (GLUCOTROL XL) 10 MG 24 hr tablet TAKE 1 TABLET(10 MG) BY MOUTH DAILY WITH BREAKFAST   Multiple Vitamin (MULTIVITAMIN) tablet Take 1 tablet by mouth daily.   ondansetron (ZOFRAN ODT) 4 MG disintegrating tablet Take 1 tablet (4 mg total) by mouth every 6 (six) hours as needed for nausea.   [DISCONTINUED] metFORMIN (GLUCOPHAGE-XR) 500 MG 24 hr tablet Take 2 tablets (1,000 mg total) by mouth daily with breakfast.   metFORMIN (GLUCOPHAGE-XR) 500 MG 24 hr tablet Take 4 tablets (2,000 mg total) by mouth daily with breakfast.   No facility-administered encounter medications on file as of 12/20/2020.

## 2020-12-23 MED ORDER — ATORVASTATIN CALCIUM 20 MG PO TABS: 20.0000 mg | ORAL_TABLET | Freq: Every day | ORAL | 3 refills | Status: DC

## 2020-12-23 NOTE — Telephone Encounter (Signed)
Last Lipid 11/05/2019.  Ok to refill?

## 2020-12-24 ENCOUNTER — Ambulatory Visit
Admission: RE | Admit: 2020-12-24 | Discharge: 2020-12-24 | Disposition: A | Discharge status: Home / Self Care | Payer: 59 | Source: Ambulatory Visit | Attending: Internal Medicine | Admitting: Internal Medicine

## 2020-12-24 DIAGNOSIS — E042 Nontoxic multinodular goiter: Secondary | ICD-10-CM

## 2021-01-21 ENCOUNTER — Encounter: Payer: Self-pay | Admitting: Family Medicine

## 2021-01-24 ENCOUNTER — Other Ambulatory Visit: Payer: Self-pay | Admitting: Family Medicine

## 2021-03-04 ENCOUNTER — Encounter: Payer: Self-pay | Admitting: Family Medicine

## 2021-03-15 ENCOUNTER — Other Ambulatory Visit: Payer: Self-pay | Admitting: Family Medicine

## 2021-03-15 DIAGNOSIS — R110 Nausea: Secondary | ICD-10-CM

## 2021-03-15 DIAGNOSIS — E039 Hypothyroidism, unspecified: Secondary | ICD-10-CM

## 2021-03-15 DIAGNOSIS — E559 Vitamin D deficiency, unspecified: Secondary | ICD-10-CM

## 2021-03-15 DIAGNOSIS — Z79899 Other long term (current) drug therapy: Secondary | ICD-10-CM

## 2021-03-15 DIAGNOSIS — E785 Hyperlipidemia, unspecified: Secondary | ICD-10-CM

## 2021-03-15 DIAGNOSIS — E1169 Type 2 diabetes mellitus with other specified complication: Secondary | ICD-10-CM

## 2021-03-15 NOTE — Telephone Encounter (Signed)
Last office visit 12/20/2020 for DM & HTN.  Last refilled 05/17/2020 for #30 with 1 refill.  CPE scheduled 04/27/2021.

## 2021-03-16 ENCOUNTER — Other Ambulatory Visit: Payer: 59

## 2021-03-23 ENCOUNTER — Encounter: Payer: 59 | Admitting: Family Medicine

## 2021-04-12 ENCOUNTER — Other Ambulatory Visit: Payer: Self-pay | Admitting: Family Medicine

## 2021-04-15 ENCOUNTER — Ambulatory Visit
Admission: EM | Admit: 2021-04-15 | Discharge: 2021-04-15 | Disposition: A | Discharge status: Home / Self Care | Payer: 59 | Attending: Urgent Care | Admitting: Urgent Care

## 2021-04-15 ENCOUNTER — Other Ambulatory Visit: Payer: Self-pay

## 2021-04-15 ENCOUNTER — Encounter: Payer: Self-pay | Admitting: Urgent Care

## 2021-04-15 DIAGNOSIS — J3489 Other specified disorders of nose and nasal sinuses: Secondary | ICD-10-CM | POA: Diagnosis not present

## 2021-04-15 DIAGNOSIS — R0981 Nasal congestion: Secondary | ICD-10-CM

## 2021-04-15 MED ORDER — IPRATROPIUM BROMIDE 0.03 % NA SOLN: NASAL | 12 refills | Status: DC

## 2021-04-15 MED ORDER — LEVOCETIRIZINE DIHYDROCHLORIDE 5 MG PO TABS
5.0000 mg | ORAL_TABLET | Freq: Every day | ORAL | 0 refills | Status: DC
Start: 2021-04-15 — End: 2022-05-01

## 2021-04-15 MED ORDER — MONTELUKAST SODIUM 10 MG PO TABS: 10.0000 mg | ORAL_TABLET | Freq: Every day | ORAL | 0 refills | Status: DC

## 2021-04-15 MED ORDER — LEVOCETIRIZINE DIHYDROCHLORIDE 5 MG PO TABS
5.0000 mg | ORAL_TABLET | Freq: Every day | ORAL | 0 refills | Status: DC
Start: 2021-04-15 — End: 2021-04-15

## 2021-04-15 NOTE — ED Triage Notes (Signed)
Triaged by provider  

## 2021-04-15 NOTE — ED Provider Notes (Signed)
Carrie Ellis    CSN: [id number] Arrival date & time: 04/15/21  1706      History   Chief Complaint Chief Complaint  Patient presents with   Nasal Congestion    HPI Carrie Ellis is a 56 y.o. female.   Pleasant 56yo female presents with a 5 day hx of nasal congestion and drainage. She states she that she is prone to seasonal allergies but feels that her symptoms started earlier than normal. She states it was just mild congestion starting on Monday that has progressed to worsening congestion, associated now with dry cough, rhinorrhea and sneezing. She denies sore throat, headache, fever, sinus pain or ear pain. She has no headache. She has taken OTC claritin and mucinex without relief. She denies any sick contacts and states she is UTD on vaccinations.    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 Authorizing Provider  amLODipine (NORVASC) 10 MG tablet TAKE 1 TABLET(10 MG) BY MOUTH DAILY 04/12/21   Copland, Karleen Hampshire, MD  atorvastatin (LIPITOR) 20 MG tablet Take 1 tablet (20 mg total) by mouth daily. 12/23/20   Copland, Karleen Hampshire, MD  glipiZIDE (GLUCOTROL XL) 10 MG 24 hr tablet TAKE 1 TABLET(10 MG) BY  MOUTH DAILY WITH BREAKFAST 01/24/21   Copland, Karleen Hampshire, MD  ipratropium (ATROVENT) 0.03 % nasal spray Spray two sprays in each nostril up to four times daily as needed 04/15/21   Luisalberto Beegle L, PA  levocetirizine (XYZAL) 5 MG tablet Take 1 tablet (5 mg total) by mouth daily. 04/15/21   Karnell Vanderloop L, PA  metFORMIN (GLUCOPHAGE-XR) 500 MG 24 hr tablet Take 4 tablets (2,000 mg total) by mouth daily with breakfast. 12/20/20   Copland, Karleen Hampshire, MD  montelukast (SINGULAIR) 10 MG tablet Take 1 tablet (10 mg total) by mouth at bedtime. 04/15/21   Mattisen Pohlmann, Alphonzo Lemmings L, PA  Multiple Vitamin (MULTIVITAMIN) tablet Take 1 tablet by mouth daily.    [provider]  ondansetron (ZOFRAN-ODT) 4 MG disintegrating tablet DISSOLVE 1 TABLET(4 MG) ON THE TONGUE EVERY 6 HOURS AS NEEDED FOR NAUSEA 03/15/21   Copland, Karleen Hampshire, MD    Family History Family History  Problem Relation Age of Onset   Stroke Other    Diabetes Other    Cancer Mother    Heart disease Mother    Alcohol abuse Neg Hx    Mental illness Neg  Hx     Social History Social History   Tobacco Use   Smoking status: Never   Smokeless tobacco: Never  Vaping Use   Vaping Use: Never used  Substance Use Topics   Alcohol use: No   Drug use: No     Allergies   Penicillins, Ace inhibitors, Clarithromycin, and Diclofenac   Review of Systems Review of Systems  Constitutional:  Negative for fatigue and fever.  HENT:  Positive for congestion, rhinorrhea and sneezing.   Respiratory:  Positive for cough.     Physical Exam Triage Vital Signs ED Triage Vitals [04/15/21 1843]  Enc Vitals Group     BP 138/85     Pulse Rate (!) 101     Resp 18     Temp 98.7 F (37.1 C)     Temp Source Oral     SpO2 97 %     Weight      Height      Head Circumference      Peak Flow      Pain Score      Pain Loc      Pain Edu?      Excl. in GC?    No data found.  Updated Vital Signs BP 138/85 (BP Location: Left Arm)    Pulse (!) 101    Temp 98.7  F (37.1 C) (Oral)    Resp 18    SpO2 97%   Visual Acuity Right Eye Distance:   Left Eye Distance:   Bilateral Distance:    Right Eye Near:   Left Eye Near:    Bilateral Near:     Physical Exam Vitals and nursing note reviewed.  Constitutional:      General: She is not in acute distress.    Appearance: Normal appearance. She is well-developed. She is obese. She is not ill-appearing, toxic-appearing or diaphoretic.  HENT:     Head: Normocephalic and atraumatic.     Right Ear: Tympanic membrane, ear canal and external ear normal. There is no impacted cerumen.     Left Ear: Tympanic membrane, ear canal and external ear normal. There is no impacted cerumen.     Nose: Congestion and rhinorrhea present.     Mouth/Throat:     Mouth: Mucous membranes are moist.     Pharynx: Oropharynx is clear. No oropharyngeal exudate or posterior oropharyngeal erythema.  Eyes:     General: No scleral icterus.       Right eye: No discharge.        Left eye: No discharge.     Extraocular Movements: Extraocular movements intact.     Conjunctiva/sclera: Conjunctivae normal.     Pupils: Pupils are equal, round, and reactive to light.  Cardiovascular:     Rate and Rhythm: Normal rate and regular rhythm.     Pulses: Normal pulses.     Heart sounds: Normal heart sounds. No murmur heard. Pulmonary:     Effort: Pulmonary effort is normal. No respiratory distress.     Breath sounds: Normal breath sounds. No stridor. No wheezing, rhonchi or rales.  Chest:     Chest wall: No tenderness.  Abdominal:     Palpations: Abdomen is soft.     Tenderness: There is no abdominal tenderness.  Musculoskeletal:        General: No swelling. Normal range of motion.     Cervical back: Normal range of motion and neck supple. No rigidity or tenderness.  Lymphadenopathy:  Cervical: No cervical adenopathy.  Skin:    General: Skin is warm and dry.     Capillary Refill: Capillary refill takes less than 2 seconds.      Coloration: Skin is not jaundiced.     Findings: No bruising, erythema or rash.  Neurological:     General: No focal deficit present.     Mental Status: She is alert and oriented to person, place, and time.     Cranial Nerves: No cranial nerve deficit.     Motor: No weakness.  Psychiatric:        Mood and Affect: Mood normal.     UC Treatments / Results  Labs (all labs ordered are listed, but only abnormal results are displayed) Labs Reviewed - No data to display  EKG   Radiology No results found.  Procedures Procedures (including critical care time)  Medications Ordered in UC Medications - No data to display  Initial Impression / Assessment and Plan / UC Course  I have reviewed the triage vital signs and the nursing notes.  Pertinent labs & imaging results that were available during my care of the patient were reviewed by me and considered in my medical decision making (see chart for details).     Nasal congestion with rhinorrhea - pt declined covid testing which I think is reasonable since today is day 5 of sx and thus no treatment would be offered even if positive. Pt has failed on claritin at home. Sx do sound consistent with seasonal allergies. Will stop OTC meds, and start xyzal, singulair and atrovent nasal spray. Additional supportive measures including saline rinses and humidification recommended. FU with PCP should sx persist or worsen.  Final Clinical Impressions(s) / UC Diagnoses   Final diagnoses:  Nasal congestion with rhinorrhea     Discharge Instructions      Your symptoms today are likely virally or allergically mediated. Purchase a warm mist vaporizer for use at nighttime, continue using steam from shower. Please start using the nasal spray prescribed today, 2-4 times daily as needed.  Stop use if nasal bleeding occurs Start taking the montelukast nightly. Start taking xyzal daily, switch to night time use of drowsiness occurs. Follow up with  your PCP if symptoms persist    ED Prescriptions     Medication Sig Dispense Auth. Provider   montelukast (SINGULAIR) 10 MG tablet  (Status: Discontinued) Take 1 tablet (10 mg total) by mouth at bedtime. 30 tablet Jamarkis Branam L, PA   levocetirizine (XYZAL) 5 MG tablet  (Status: Discontinued) Take 1 tablet (5 mg total) by mouth daily. 30 tablet Javonta Gronau L, PA   ipratropium (ATROVENT) 0.03 % nasal spray  (Status: Discontinued) Spray two sprays in each nostril up to four times daily as needed 30 mL Deanglo Hissong L, PA   ipratropium (ATROVENT) 0.03 % nasal spray Spray two sprays in each nostril up to four times daily as needed 30 mL Isabellarose Kope L, PA   levocetirizine (XYZAL) 5 MG tablet Take 1 tablet (5 mg total) by mouth daily. 30 tablet Winda Summerall L, PA   montelukast (SINGULAIR) 10 MG tablet Take 1 tablet (10 mg total) by mouth at bedtime. 30 tablet Khoen Genet L, [place]      PDMP not reviewed this encounter.   Maretta Bees, [place] 04/15/21 2108

## 2021-04-15 NOTE — Discharge Instructions (Signed)
Your symptoms today are likely virally or allergically mediated. ?Purchase a warm mist vaporizer for use at nighttime, continue using steam from shower. ?Please start using the nasal spray prescribed today, 2-4 times daily as needed.  Stop use if nasal bleeding occurs ?Start taking the montelukast nightly. ?Start taking xyzal daily, switch to night time use of drowsiness occurs. ?Follow up with your PCP if symptoms persist ?

## 2021-04-19 ENCOUNTER — Telehealth (INDEPENDENT_AMBULATORY_CARE_PROVIDER_SITE_OTHER): Payer: 59 | Admitting: Family

## 2021-04-19 ENCOUNTER — Encounter: Payer: Self-pay | Admitting: Family

## 2021-04-19 ENCOUNTER — Ambulatory Visit
Admission: RE | Admit: 2021-04-19 | Discharge: 2021-04-19 | Disposition: A | Discharge status: Home / Self Care | Payer: 59 | Source: Ambulatory Visit | Attending: Family | Admitting: Family

## 2021-04-19 ENCOUNTER — Other Ambulatory Visit: Payer: Self-pay

## 2021-04-19 DIAGNOSIS — M25561 Pain in right knee: Secondary | ICD-10-CM

## 2021-04-19 NOTE — Progress Notes (Signed)
? ?Established Patient Office Visit ? ?Subjective:  ?Patient ID: Carrie Ellis, female    DOB: October 17, 1965  Age: 56 y.o. MRN: [id number] ? ?CC:  ?Chief Complaint  ?Patient presents with  ? Injury  ?  Pt stated-- injury fell on the right knee--tender to touch, sore--3 days. Tried elevated and ice it help some.  ? ? ?HPI ?Carrie Ellis is here today with concerns.  ? ?Three days ago while working in her yard she was going to walk into her yard, and there was an incline she was walking down towards and her left leg fell into a hole in the ground. Her right leg fell landing on her right knee hitting the ground straight on. And since she has been experiencing a lot of pain. She didn't hear a snap, no blood. Tender and soreness on the left medial area radiating around the top of the knee cap. Making it difficult for her to bend down and use the rest room. It is hard to hold herself up when squatting or trying to seat, very weak. Has taken some tylenol for muscle and aches 500 mg x 2 with some mild relief.  ? ?Past Medical History:  ?Diagnosis Date  ? Diabetes mellitus type 2 in obese ([place]) 04/14/2015  ? Hypertension   ? Hypothyroid   ? Positive PPD, treated   ? 9 months of treatment  ? ? ?Past Surgical History:  ?Procedure Laterality Date  ? CHOLECYSTECTOMY  1999  ? TONSILLECTOMY  1995  ? ? ?Family History  ?Problem Relation Age of Onset  ? Stroke Other   ? Diabetes Other   ? Cancer Mother   ? Heart disease Mother   ? Alcohol abuse Neg Hx   ? Mental illness Neg Hx   ? ? ?Social History  ? ?Socioeconomic History  ? Marital status: Married  ?  Spouse name: Not on file  ? Number of children: Not on file  ? Years of education: Not on file  ? Highest education level: Not on file  ?Occupational History  ? Occupation: Careers information officer  ?  Employer: [employer]  ?  Comment: [employer]  ?Tobacco Use  ? Smoking status: Never  ? Smokeless tobacco: Never  ?Vaping Use  ? Vaping Use: Never  used  ?Substance and Sexual Activity  ? Alcohol use: No  ? Drug use: No  ? Sexual activity: Not Currently  ?  Partners: Male  ?  Birth control/protection: Other-see comments  ?  Comment: husband had vasectomy  ?Other Topics Concern  ? Not on file  ?Social History Narrative  ? Not on file  ? ?Social Determinants of Health  ? ?Financial Resource Strain: Not on file  ?Food Insecurity: Not on file  ?Transportation Needs: Not on file  ?Physical Activity: Not on file  ?Stress: Not on file  ?Social Connections: Not on file  ?Intimate Partner Violence: Not on file  ? ? ?Outpatient Medications Prior to Visit  ?Medication Sig Dispense Refill  ? amLODipine (NORVASC) 10 MG tablet TAKE 1 TABLET(10 MG) BY MOUTH DAILY 90 tablet 1  ? atorvastatin (LIPITOR) 20 MG tablet Take 1 tablet (20 mg total) by mouth daily. 90 tablet 3  ? glipiZIDE (GLUCOTROL XL) 10 MG 24 hr tablet TAKE 1 TABLET(10 MG) BY MOUTH DAILY WITH BREAKFAST 30 tablet 5  ? ipratropium (ATROVENT) 0.03 % nasal spray Spray two sprays in each nostril up to four times daily as needed 30 mL 12  ?  levocetirizine (XYZAL) 5 MG tablet Take 1 tablet (5 mg total) by mouth daily. 30 tablet 0  ? metFORMIN (GLUCOPHAGE-XR) 500 MG 24 hr tablet Take 4 tablets (2,000 mg total) by mouth daily with breakfast. 360 tablet 1  ? montelukast (SINGULAIR) 10 MG tablet Take 1 tablet (10 mg total) by mouth at bedtime. 30 tablet 0  ? Multiple Vitamin (MULTIVITAMIN) tablet Take 1 tablet by mouth daily.    ? ondansetron (ZOFRAN-ODT) 4 MG disintegrating tablet DISSOLVE 1 TABLET(4 MG) ON THE TONGUE EVERY 6 HOURS AS NEEDED FOR NAUSEA 30 tablet 1  ? ?No facility-administered medications prior to visit.  ? ? ?Allergies  ?Allergen Reactions  ? Penicillins   ?  REACTION: rash  ? Ace Inhibitors Cough  ? Clarithromycin Nausea Only  ?  REACTION: Extreme nausea  ? Diclofenac Nausea Only  ? ? ?ROS ?Review of Systems  ?Constitutional:  Negative for chills, fatigue and fever.  ?Musculoskeletal:  Positive for  arthralgias (right knee pain with injury). Negative for joint swelling.  ?Neurological:  Positive for weakness (right knee).  ? ?  ?Objective:  ?  ?Physical Exam ?Constitutional:   ?   Appearance: She is obese.  ?Pulmonary:  ?   Effort: Pulmonary effort is normal.  ?Musculoskeletal:  ?   Right knee: No swelling. Decreased range of motion (painful ROM all motions).  ?Neurological:  ?   Mental Status: She is alert.  ? ? ?There were no vitals taken for this visit. ?Wt Readings from Last 3 Encounters:  ?12/20/20 255 lb 8 oz (115.9 kg)  ?12/07/20 256 lb 3.2 oz (116.2 kg)  ?09/06/20 259 lb 8 oz (117.7 kg)  ? ? ? ?Health Maintenance Due  ?Topic Date Due  ? Zoster Vaccines- Shingrix (1 of 2) Never done  ? FOOT EXAM  04/29/2019  ? URINE MICROALBUMIN  11/04/2020  ? ? ?There are no preventive care reminders to display for this patient. ? ?Lab Results  ?Component Value Date  ? TSH 1.59 12/07/2020  ? ?Lab Results  ?Component Value Date  ? WBC 4.8 11/05/2019  ? HGB 13.2 11/05/2019  ? HCT 40.4 11/05/2019  ? MCV 80.4 11/05/2019  ? PLT 253.0 11/05/2019  ? ?Lab Results  ?Component Value Date  ? NA 138 11/05/2019  ? K 3.9 11/05/2019  ? CO2 26 11/05/2019  ? GLUCOSE 167 (H) 11/05/2019  ? BUN 9 11/05/2019  ? CREATININE 0.83 11/05/2019  ? BILITOT 0.5 11/05/2019  ? ALKPHOS 77 11/05/2019  ? AST 13 11/05/2019  ? ALT 12 11/05/2019  ? PROT 7.7 11/05/2019  ? ALBUMIN 4.3 11/05/2019  ? CALCIUM 9.1 11/05/2019  ? GFR 86.62 11/05/2019  ? ?Lab Results  ?Component Value Date  ? HGBA1C 9.4 (A) 12/20/2020  ? ? ?  ?Assessment & Plan:  ? ?Problem List Items Addressed This Visit   ? ?  ? Other  ? Acute pain of right knee - Primary  ?  Right knee xray  ?Naproxen/ibuprofen prn  ?Recommend knee sleeve for support ?F/u with Dr. Lorelei Pont if no improvement with RICE over the next one week. ?  ?  ? Relevant Orders  ? DG Knee Complete 4 Views Right  ? ? ?No orders of the defined types were placed in this encounter. ? ? ?Follow-up: No follow-ups on file.   ? ? ?Carrie Pancoast, FNP ?

## 2021-04-19 NOTE — Assessment & Plan Note (Signed)
Right knee xray  ?Naproxen/ibuprofen prn  ?Recommend knee sleeve for support ?F/u with Dr. Patsy Lager if no improvement with RICE over the next one week. ?

## 2021-04-20 ENCOUNTER — Ambulatory Visit (INDEPENDENT_AMBULATORY_CARE_PROVIDER_SITE_OTHER)
Admission: RE | Admit: 2021-04-20 | Discharge: 2021-04-20 | Disposition: A | Discharge status: Home / Self Care | Payer: 59 | Source: Ambulatory Visit | Attending: Family | Admitting: Family

## 2021-04-20 ENCOUNTER — Other Ambulatory Visit: Payer: Self-pay

## 2021-04-20 ENCOUNTER — Other Ambulatory Visit (INDEPENDENT_AMBULATORY_CARE_PROVIDER_SITE_OTHER): Payer: 59

## 2021-04-20 DIAGNOSIS — E039 Hypothyroidism, unspecified: Secondary | ICD-10-CM | POA: Diagnosis not present

## 2021-04-20 DIAGNOSIS — Z79899 Other long term (current) drug therapy: Secondary | ICD-10-CM | POA: Diagnosis not present

## 2021-04-20 DIAGNOSIS — M25561 Pain in right knee: Secondary | ICD-10-CM | POA: Diagnosis not present

## 2021-04-20 DIAGNOSIS — E1169 Type 2 diabetes mellitus with other specified complication: Secondary | ICD-10-CM | POA: Diagnosis not present

## 2021-04-20 DIAGNOSIS — E785 Hyperlipidemia, unspecified: Secondary | ICD-10-CM | POA: Diagnosis not present

## 2021-04-20 DIAGNOSIS — E669 Obesity, unspecified: Secondary | ICD-10-CM

## 2021-04-20 DIAGNOSIS — E559 Vitamin D deficiency, unspecified: Secondary | ICD-10-CM

## 2021-04-20 LAB — LIPID PANEL
Cholesterol: 171 mg/dL (ref 0–200)
HDL: 62.3 mg/dL (ref >39.00)
LDL Cholesterol: 92 mg/dL (ref 0–99)
NonHDL: 108.89
Total CHOL/HDL Ratio: 3
Triglycerides: 85 mg/dL (ref 0.0–149.0)
VLDL: 17 mg/dL (ref 0.0–40.0)

## 2021-04-20 LAB — MICROALBUMIN / CREATININE URINE RATIO
Creatinine,U: 165.3 mg/dL
Microalb Creat Ratio: 12.1 mg/g (ref 0.0–30.0)
Microalb, Ur: 20 mg/dL — ABNORMAL HIGH (ref 0.0–1.9)

## 2021-04-20 LAB — BASIC METABOLIC PANEL
BUN: 10 mg/dL (ref 6–23)
CO2: 28 mEq/L (ref 19–32)
Calcium: 9.4 mg/dL (ref 8.4–10.5)
Chloride: 102 mEq/L (ref 96–112)
Creatinine, Ser: 0.75 mg/dL (ref 0.40–1.20)
GFR: 89.49 mL/min (ref >60.00)
Glucose, Bld: 160 mg/dL — ABNORMAL HIGH (ref 70–99)
Potassium: 4.2 mEq/L (ref 3.5–5.1)
Sodium: 139 mEq/L (ref 135–145)

## 2021-04-20 LAB — CBC WITH DIFFERENTIAL/PLATELET
Basophils Absolute: 0.1 10*3/uL (ref 0.0–0.1)
Basophils Relative: 1.1 % (ref 0.0–3.0)
Eosinophils Absolute: 0.2 10*3/uL (ref 0.0–0.7)
Eosinophils Relative: 4.4 % (ref 0.0–5.0)
HCT: 42 % (ref 36.0–46.0)
Hemoglobin: 13.5 g/dL (ref 12.0–15.0)
Lymphocytes Relative: 45.1 % (ref 12.0–46.0)
Lymphs Abs: 2.2 10*3/uL (ref 0.7–4.0)
MCHC: 32.2 g/dL (ref 30.0–36.0)
MCV: 81.6 fl (ref 78.0–100.0)
Monocytes Absolute: 0.2 10*3/uL (ref 0.1–1.0)
Monocytes Relative: 4.9 % (ref 3.0–12.0)
Neutro Abs: 2.2 10*3/uL (ref 1.4–7.7)
Neutrophils Relative %: 44.5 % (ref 43.0–77.0)
Platelets: 279 10*3/uL (ref 150.0–400.0)
RBC: 5.15 Mil/uL — ABNORMAL HIGH (ref 3.87–5.11)
RDW: 14 % (ref 11.5–15.5)
WBC: 4.9 10*3/uL (ref 4.0–10.5)

## 2021-04-20 LAB — HEPATIC FUNCTION PANEL
ALT: 14 U/L (ref 0–35)
AST: 14 U/L (ref 0–37)
Albumin: 4.4 g/dL (ref 3.5–5.2)
Alkaline Phosphatase: 89 U/L (ref 39–117)
Bilirubin, Direct: 0.1 mg/dL (ref 0.0–0.3)
Total Bilirubin: 0.6 mg/dL (ref 0.2–1.2)
Total Protein: 7.3 g/dL (ref 6.0–8.3)

## 2021-04-20 LAB — T3, FREE: T3, Free: 3.6 pg/mL (ref 2.3–4.2)

## 2021-04-20 LAB — T4, FREE: Free T4: 1.04 ng/dL (ref 0.60–1.60)

## 2021-04-20 LAB — HEMOGLOBIN A1C: Hgb A1c MFr Bld: 7.5 % — ABNORMAL HIGH (ref 4.6–6.5)

## 2021-04-20 LAB — VITAMIN D 25 HYDROXY (VIT D DEFICIENCY, FRACTURES): VITD: 25.91 ng/mL — ABNORMAL LOW (ref 30.00–100.00)

## 2021-04-20 LAB — TSH: TSH: 1.92 u[IU]/mL (ref 0.35–5.50)

## 2021-04-21 NOTE — Progress Notes (Signed)
Hi Dr. Patsy Lager, I saw your patient virtually for her right knee pain, she fell straight on the patellar aspect and noted some tenderness afterward. She is wearing a knee sleeve and taking it easy. Not sure if you wanted her in office to address further.

## 2021-04-25 ENCOUNTER — Encounter: Payer: 59 | Admitting: Family Medicine

## 2021-04-25 LAB — HM DIABETES EYE EXAM

## 2021-04-27 ENCOUNTER — Other Ambulatory Visit: Payer: Self-pay

## 2021-04-27 ENCOUNTER — Ambulatory Visit (INDEPENDENT_AMBULATORY_CARE_PROVIDER_SITE_OTHER): Payer: 59 | Admitting: Family Medicine

## 2021-04-27 ENCOUNTER — Encounter: Payer: Self-pay | Admitting: Family Medicine

## 2021-04-27 VITALS — BP 144/90 | HR 110 | Temp 98.3°F | Ht 70.5 in | Wt 254.0 lb

## 2021-04-27 DIAGNOSIS — Z23 Encounter for immunization: Secondary | ICD-10-CM | POA: Diagnosis not present

## 2021-04-27 DIAGNOSIS — Z0000 Encounter for general adult medical examination without abnormal findings: Secondary | ICD-10-CM | POA: Diagnosis not present

## 2021-04-27 NOTE — Progress Notes (Signed)
Tallis Soledad T. Kalley Nicholl, MD, CAQ Sports Medicine [facility] at [facility] [address] [place], [address]  Phone: [phone]  FAX: [phone]  Carrie Ellis - 56 y.o. female  MRN [id number]  Date of Birth: 03-26-1965  Date: 04/27/2021  PCP: Hannah Beat, MD  Referral: Hannah Beat, MD  Chief Complaint  Patient presents with   Annual Exam    This visit occurred during the SARS-CoV-2 public health emergency.  Safety protocols were in place, including screening questions prior to the visit, additional usage of staff PPE, and extensive cleaning of exam room while observing appropriate contact time as indicated for disinfecting solutions.   Patient Care Team: Hannah Beat, MD as PCP - General Subjective:   Carrie Ellis is a 56 y.o. pleasant patient who presents with the following:  Health Maintenance Summary Reviewed and updated, unless pt declines services.  Tobacco History Reviewed. Non-smoker Alcohol: No concerns, no excessive use Exercise Habits: Some activity, rec at least 30 mins 5 times a week - has been going to the gym and more active.  STD concerns: none Drug Use: None Lumps or breast concerns: no  Knee is a little bit sore.   Shingrix Cologuard A1c?  Done -   Diabetes Mellitus: Tolerating Medications: yes - on Metformin XR 2,000 mg total and Glipizide XR 10 mg Compliance with diet: fair, Body mass index is 35.93 kg/m. Exercise: minimal / intermittent Avg blood sugars at home: not checking Foot problems: none Hypoglycemia: none No nausea, vomitting, blurred vision, polyuria.  Lab Results  Component Value Date   HGBA1C 7.5 (H) 04/20/2021   HGBA1C 9.4 (A) 12/20/2020   HGBA1C 7.1 (A) 05/17/2020   Lab Results  Component Value Date   MICROALBUR 20.0 (H) 04/20/2021   LDLCALC 92 04/20/2021   CREATININE 0.75 04/20/2021    Wt Readings from Last 3 Encounters:  04/27/21 254 lb (115.2 kg)  12/20/20  255 lb 8 oz (115.9 kg)  12/07/20 256 lb 3.2 oz (116.2 kg)    HTN: Tolerating all medications without side effects - on Norvasc 10 mg H/o ACE cough Stable and at goal No CP, no sob. No HA.  BP Readings from Last 3 Encounters:  04/27/21 (!) 144/90  04/15/21 138/85  12/20/20 114/76    Basic Metabolic Panel:    Component Value Date/Time   NA 139 04/20/2021 0806   K 4.2 04/20/2021 0806   CL 102 04/20/2021 0806   CO2 28 04/20/2021 0806   BUN 10 04/20/2021 0806   CREATININE 0.75 04/20/2021 0806   CREATININE 0.79 09/18/2016 1117   GLUCOSE 160 (H) 04/20/2021 0806   CALCIUM 9.4 04/20/2021 0806     No substance.  Rare drink of alcohol  Lipids: Doing well, stable. Tolerating meds fine with no SE. Panel reviewed with patient.  Lipids: Lab Results  Component Value Date   CHOL 171 04/20/2021   Lab Results  Component Value Date   HDL 62.30 04/20/2021   Lab Results  Component Value Date   LDLCALC 92 04/20/2021   Lab Results  Component Value Date   TRIG 85.0 04/20/2021   Lab Results  Component Value Date   CHOLHDL 3 04/20/2021    Lab Results  Component Value Date   ALT 14 04/20/2021   AST 14 04/20/2021   ALKPHOS 89 04/20/2021   BILITOT 0.6 04/20/2021     Health Maintenance  Topic Date Due   FOOT EXAM  04/29/2019   Zoster Vaccines- Shingrix (  2 of 2) 06/22/2021   OPHTHALMOLOGY EXAM  07/06/2021   HEMOGLOBIN A1C  10/21/2021   Fecal DNA (Cologuard)  11/10/2021   URINE MICROALBUMIN  04/21/2022   MAMMOGRAM  11/18/2022   PAP SMEAR-Modifier  04/22/2024   TETANUS/TDAP  03/22/2026   INFLUENZA VACCINE  Completed   COVID-19 Vaccine  Completed   Hepatitis C Screening  Completed   HIV Screening  Completed   HPV VACCINES  Aged Out    Immunization History  Administered Date(s) Administered   Influenza Inj Mdck Quad Pf 11/23/2020   Influenza Split 11/07/2011   Influenza,inj,Quad PF,6+ Mos 11/10/2013, 11/20/2014, 10/06/2015, 10/29/2017, 11/19/2018, 11/12/2019    PFIZER(Purple Top)SARS-COV-2 Vaccination 04/25/2019, 05/19/2019, 02/01/2020   Pfizer Covid-19 Vaccine Bivalent Booster 38yrs & up 11/23/2020   Pneumococcal Polysaccharide-23 10/06/2015   Td 02/14/2004   Tdap 03/22/2016   Zoster Recombinat (Shingrix) 04/27/2021   Patient Active Problem List   Diagnosis Date Noted   Diabetes mellitus type 2 in obese (HCC) 04/14/2015    Priority: High   Essential hypertension 12/31/2008    Priority: Medium    Multinodular goiter 11/09/2016    Priority: Low   Thyrotoxicosis without thyroid storm 11/09/2016   ALLERGIC RHINITIS 12/31/2008    Past Medical History:  Diagnosis Date   Diabetes mellitus type 2 in obese (HCC) 04/14/2015   Hypertension    Hypothyroid    Positive PPD, treated    9 months of treatment    Past Surgical History:  Procedure Laterality Date   CHOLECYSTECTOMY  1999   TONSILLECTOMY  1995    Family History  Problem Relation Age of Onset   Stroke Other    Diabetes Other    Cancer Mother    Heart disease Mother    Alcohol abuse Neg Hx    Mental illness Neg Hx     Past Medical History, Surgical History, Social History, Family History, Problem List, Medications, and Allergies have been reviewed and updated if relevant.  Review of Systems: Pertinent positives are listed above.  Otherwise, a full 14 point review of systems has been done in full and it is negative except where it is noted positive.  Objective:   BP (!) 144/90 (BP Location: Left Arm, Patient Position: Sitting)   Pulse (!) 110   Temp 98.3 F (36.8 C) (Oral)   Ht 5' 10.5" (1.791 m)   Wt 254 lb (115.2 kg)   SpO2 98%   BMI 35.93 kg/m  Ideal Body Weight: Weight in (lb) to have BMI = 25: 176.4 No results found. Depression screen [facility] 2/9 04/27/2021 11/12/2019 11/04/2018 10/29/2017 09/20/2016  Decreased Interest 0 0 0 0 0  Down, Depressed, Hopeless 0 0 0 0 0  PHQ - 2 Score 0 0 0 0 0     GEN: well developed, well nourished, no acute distress Eyes: conjunctiva  and lids normal, PERRLA, EOMI ENT: TM clear, nares clear, oral exam WNL Neck: supple, no lymphadenopathy, no thyromegaly, no JVD Pulm: clear to auscultation and percussion, respiratory effort normal CV: regular rate and rhythm, S1-S2, no murmur, rub or gallop, no bruits Chest: no scars, masses, no lumps BREAST: breast exam declined GI: soft, non-tender; no hepatosplenomegaly, masses; active bowel sounds all quadrants GU: GU exam declined Lymph: no cervical, axillary or inguinal adenopathy MSK: gait normal, muscle tone and strength WNL, no joint swelling, effusions, discoloration, crepitus  SKIN: clear, good turgor, color WNL, no rashes, lesions, or ulcerations Neuro: normal mental status, normal strength, sensation, and motion Psych: alert; oriented  to person, place and time, normally interactive and not anxious or depressed in appearance.   All labs reviewed with patient. Results for orders placed or performed in visit on 04/20/21  VITAMIN D 25 Hydroxy (Vit-D Deficiency, Fractures)  Result Value Ref Range   VITD 25.91 (L) 30.00 - 100.00 ng/mL  Lipid panel  Result Value Ref Range   Cholesterol 171 0 - 200 mg/dL   Triglycerides 40.9 0.0 - 149.0 mg/dL   HDL 81.19 >14.78 mg/dL   VLDL 29.5 0.0 - 62.1 mg/dL   LDL Cholesterol 92 0 - 99 mg/dL   Total CHOL/HDL Ratio 3    NonHDL 108.89   Hepatic function panel  Result Value Ref Range   Total Bilirubin 0.6 0.2 - 1.2 mg/dL   Bilirubin, Direct 0.1 0.0 - 0.3 mg/dL   Alkaline Phosphatase 89 39 - 117 U/L   AST 14 0 - 37 U/L   ALT 14 0 - 35 U/L   Total Protein 7.3 6.0 - 8.3 g/dL   Albumin 4.4 3.5 - 5.2 g/dL  Basic metabolic panel  Result Value Ref Range   Sodium 139 135 - 145 mEq/L   Potassium 4.2 3.5 - 5.1 mEq/L   Chloride 102 96 - 112 mEq/L   CO2 28 19 - 32 mEq/L   Glucose, Bld 160 (H) 70 - 99 mg/dL   BUN 10 6 - 23 mg/dL   Creatinine, Ser 3.08 0.40 - 1.20 mg/dL   GFR 65.78 >46.96 mL/min   Calcium 9.4 8.4 - 10.5 mg/dL  CBC with  Differential/Platelet  Result Value Ref Range   WBC 4.9 4.0 - 10.5 K/uL   RBC 5.15 (H) 3.87 - 5.11 Mil/uL   Hemoglobin 13.5 12.0 - 15.0 g/dL   HCT 29.5 28.4 - 13.2 %   MCV 81.6 78.0 - 100.0 fl   MCHC 32.2 30.0 - 36.0 g/dL   RDW 44.0 10.2 - 72.5 %   Platelets 279.0 150.0 - 400.0 K/uL   Neutrophils Relative % 44.5 43.0 - 77.0 %   Lymphocytes Relative 45.1 12.0 - 46.0 %   Monocytes Relative 4.9 3.0 - 12.0 %   Eosinophils Relative 4.4 0.0 - 5.0 %   Basophils Relative 1.1 0.0 - 3.0 %   Neutro Abs 2.2 1.4 - 7.7 K/uL   Lymphs Abs 2.2 0.7 - 4.0 K/uL   Monocytes Absolute 0.2 0.1 - 1.0 K/uL   Eosinophils Absolute 0.2 0.0 - 0.7 K/uL   Basophils Absolute 0.1 0.0 - 0.1 K/uL  Hemoglobin A1c  Result Value Ref Range   Hgb A1c MFr Bld 7.5 (H) 4.6 - 6.5 %  Microalbumin / creatinine urine ratio  Result Value Ref Range   Microalb, Ur 20.0 (H) 0.0 - 1.9 mg/dL   Creatinine,U 366.4 mg/dL   Microalb Creat Ratio 12.1 0.0 - 30.0 mg/g  TSH  Result Value Ref Range   TSH 1.92 0.35 - 5.50 uIU/mL  T3, free  Result Value Ref Range   T3, Free 3.6 2.3 - 4.2 pg/mL  T4, free  Result Value Ref Range   Free T4 1.04 0.60 - 1.60 ng/dL   DG Knee Complete 4 Views Right  Result Date: 04/20/2021 CLINICAL DATA:  Right knee pain. Acute pain of right knee after fall 3 days ago. EXAM: RIGHT KNEE - COMPLETE 4+ VIEW COMPARISON:  None. FINDINGS: No acute fracture or dislocation. Normal joint spaces and alignment. There is mild tricompartmental peripheral spurring. Mild subchondral cystic change in the patellofemoral compartment. Chronic spurring about  the fibular head/neck. No erosion or focal bone lesion. There may be a minimal joint effusion. IMPRESSION: 1. No acute fracture or dislocation. 2. Mild tricompartmental osteoarthritis. Possible small joint effusion. Electronically Signed   By: Narda Rutherford M.D.   On: 04/20/2021 13:42    Assessment and Plan:     ICD-10-CM   1. Healthcare maintenance  Z00.00     2. Need  for shingles vaccine  Z23 Varicella-zoster vaccine IM (Shingrix)     Basically, she is doing well.  She continues to work on [employer]. We are going to try to do a trial of weight loss.  Goal is 20 pounds.  Blood pressure is slightly up today, so we will recheck this in 6 months.  No change in diabetic medications at this point either.  Health Maintenance Exam: The patient's preventative maintenance and recommended screening tests for an annual wellness exam were reviewed in full today. Brought up to date unless services declined.  Counselled on the importance of diet, exercise, and its role in overall health and mortality. The patient's FH and SH was reviewed, including their home life, tobacco status, and drug and alcohol status.  Follow-up in 1 year for physical exam or additional follow-up below.  Follow-up: Return in about 6 months (around 10/28/2021) for diabetes and high blood pressure, Dr. Patsy Lager. Or follow-up in 1 year if not noted.  Future Appointments  Date Time Provider Department Center  10/31/2021  9:00 AM Hannah Beat, MD [facility]  12/07/2021  8:00 AM Carlus Pavlov, MD [facility] None    No orders of the defined types were placed in this encounter.  There are no discontinued medications. Orders Placed This Encounter  Procedures   Varicella-zoster vaccine IM (Shingrix)    Signed,  Dahl Higinbotham T. Derya Dettmann, MD   Allergies as of 04/27/2021       Reactions   Penicillins    REACTION: rash   Ace Inhibitors Cough   Clarithromycin Nausea Only   REACTION: Extreme nausea   Diclofenac Nausea Only        Medication List        Accurate as of April 27, 2021 11:59 PM. If you have any questions, ask your nurse or doctor.          amLODipine 10 MG tablet Commonly known as: NORVASC TAKE 1 TABLET(10 MG) BY MOUTH DAILY   atorvastatin 20 MG tablet Commonly known as: LIPITOR Take 1 tablet (20 mg total) by mouth daily.   glipiZIDE 10 MG 24 hr  tablet Commonly known as: GLUCOTROL XL TAKE 1 TABLET(10 MG) BY MOUTH DAILY WITH BREAKFAST   ipratropium 0.03 % nasal spray Commonly known as: ATROVENT Spray two sprays in each nostril up to four times daily as needed   levocetirizine 5 MG tablet Commonly known as: XYZAL Take 1 tablet (5 mg total) by mouth daily.   metFORMIN 500 MG 24 hr tablet Commonly known as: GLUCOPHAGE-XR Take 4 tablets (2,000 mg total) by mouth daily with breakfast.   montelukast 10 MG tablet Commonly known as: Singulair Take 1 tablet (10 mg total) by mouth at bedtime.   multivitamin tablet Take 1 tablet by mouth daily.   ondansetron 4 MG disintegrating tablet Commonly known as: ZOFRAN-ODT DISSOLVE 1 TABLET(4 MG) ON THE TONGUE EVERY 6 HOURS AS NEEDED FOR NAUSEA

## 2021-04-30 ENCOUNTER — Encounter: Payer: Self-pay | Admitting: Radiology

## 2021-05-07 IMAGING — MG DIGITAL SCREENING BILATERAL MAMMOGRAM WITH CAD
7 series · 7 of 7 positions shown · non-contrast
Comparison: Previous exam(s).

CLINICAL DATA: Screening.

EXAM:
DIGITAL SCREENING BILATERAL MAMMOGRAM WITH CAD

[L CC (1 of 2)]
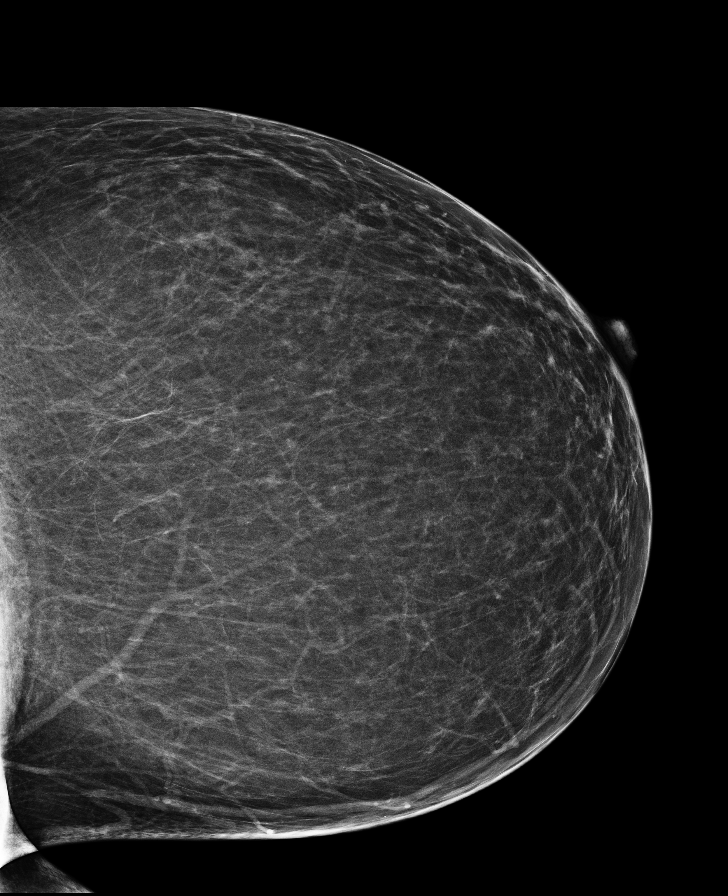

[L CC (2 of 2)]
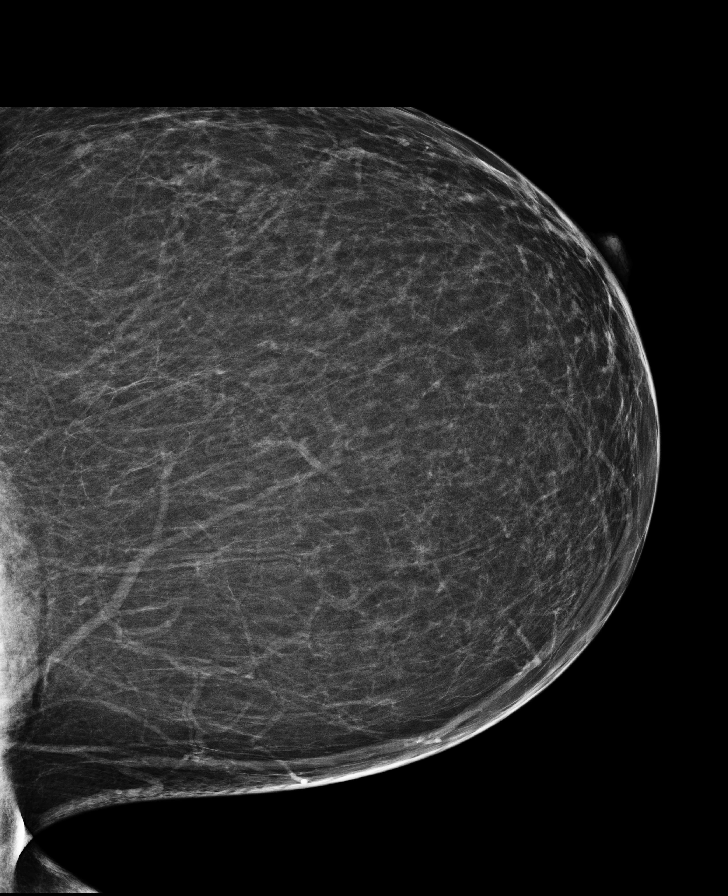

[R MLO (1 of 2)]
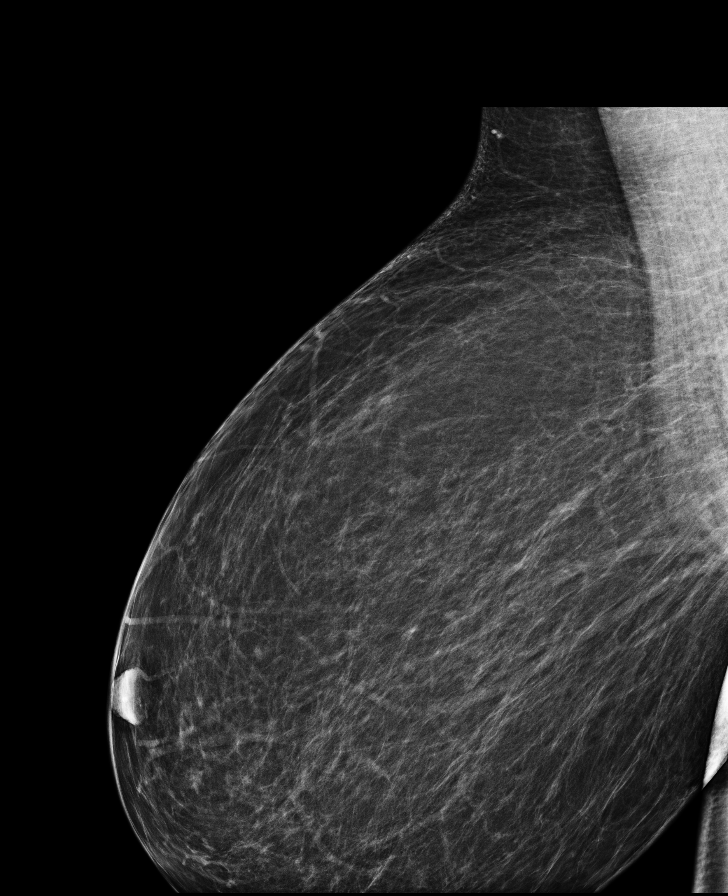

[R MLO (2 of 2)]
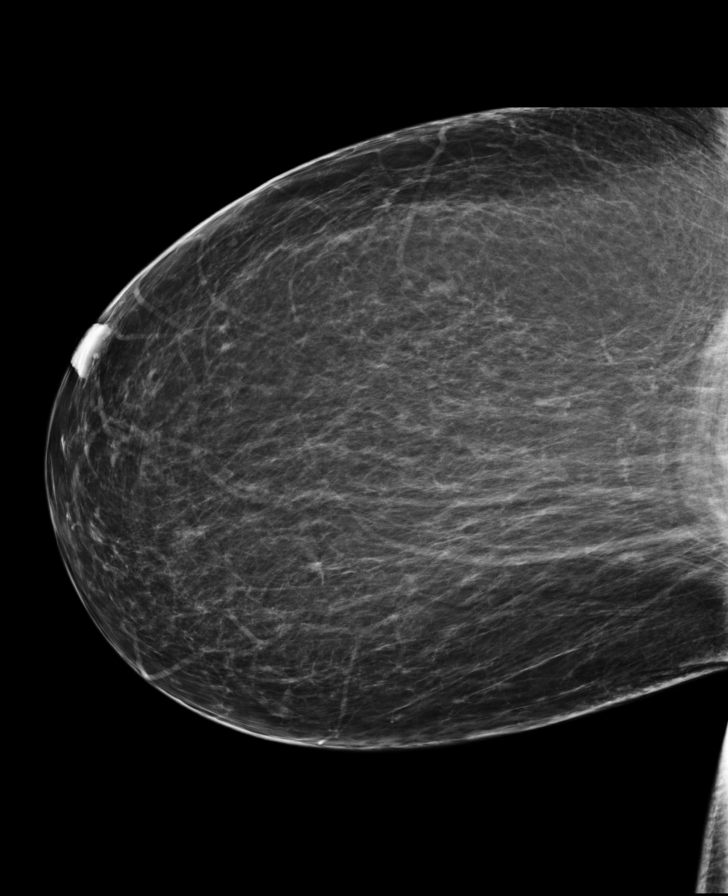

[L MLO (1 of 2)]
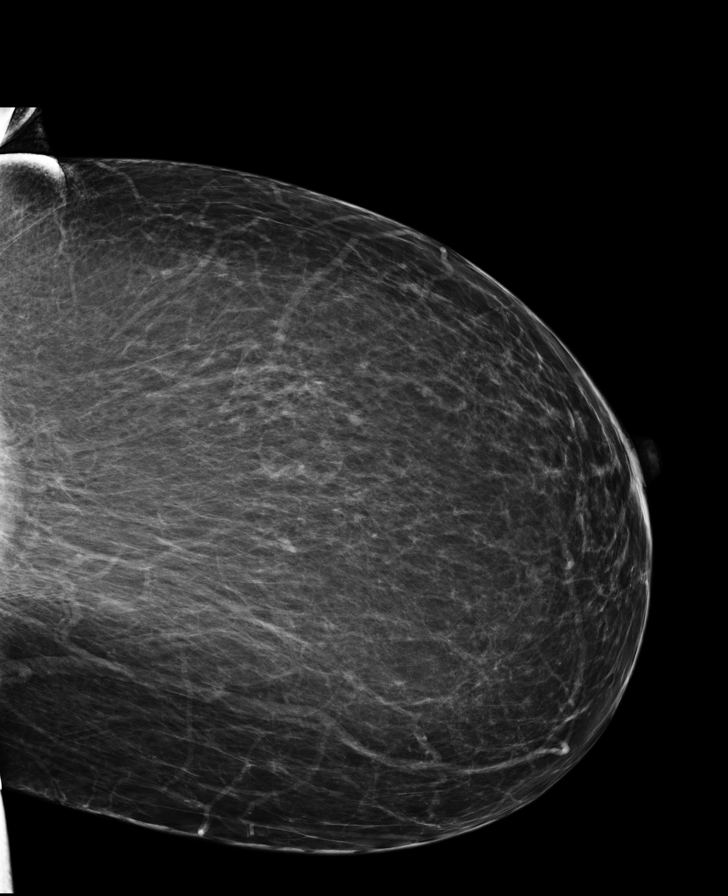

[L MLO (2 of 2)]
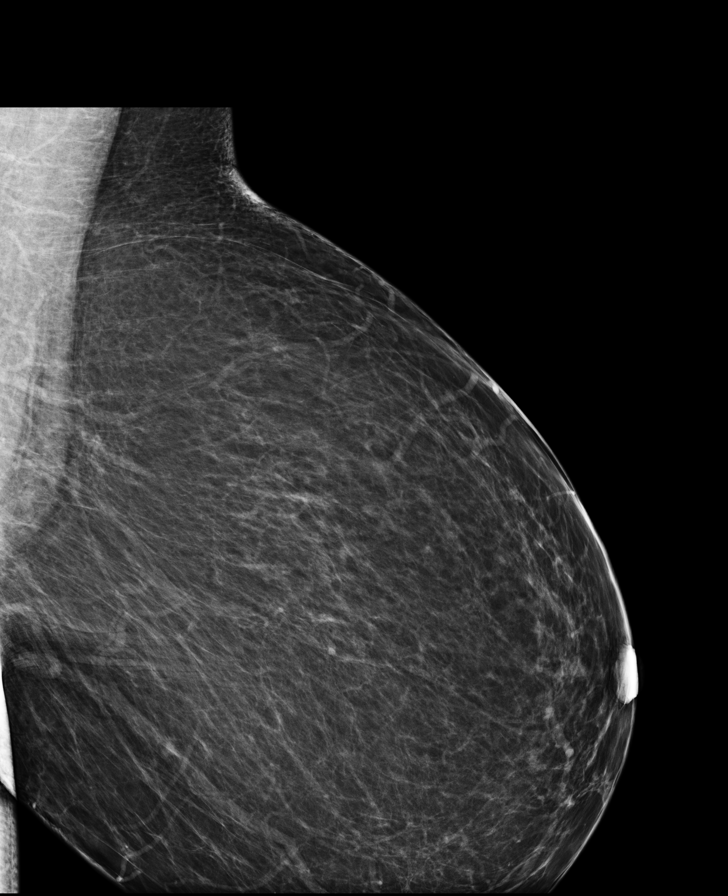

[R CC]
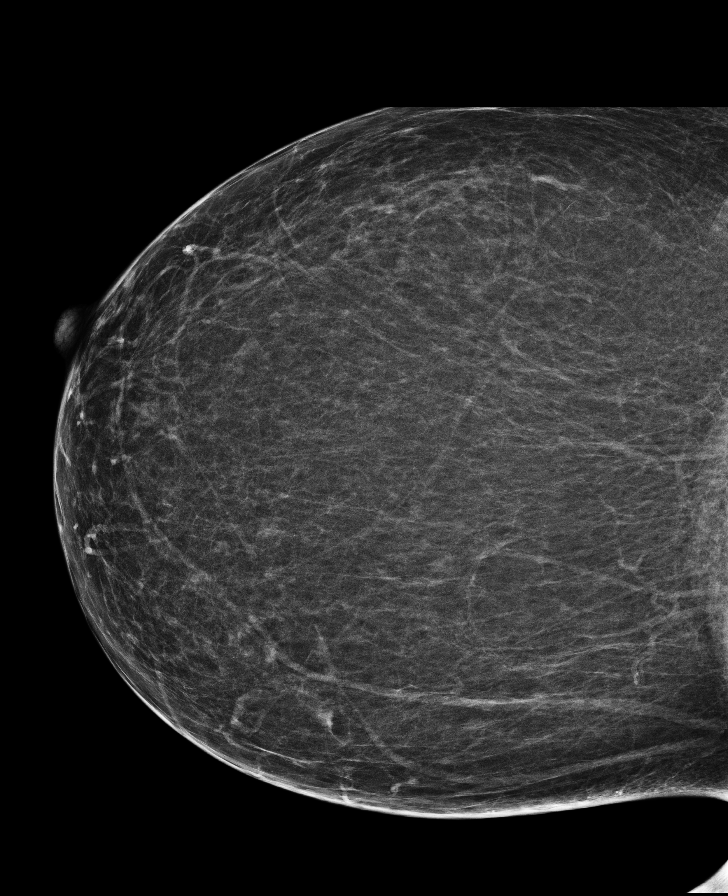

[7 of 7 positions shown; findings below may reference images not displayed]

ACR Breast Density Category b: There are scattered areas of
fibroglandular density.
FINDINGS: There are no findings suspicious for malignancy. Images were
processed with CAD.
IMPRESSION: No mammographic evidence of malignancy. A result letter of this
screening mammogram will be mailed directly to the patient.

RECOMMENDATION:
Screening mammogram in one year. (Code:AS-G-LCT)

BI-RADS CATEGORY  1: Negative.

## 2021-05-16 ENCOUNTER — Encounter: Payer: Self-pay | Admitting: Family Medicine

## 2021-05-19 NOTE — Telephone Encounter (Signed)
Order for BP monitor faxed to [facility] at [phone]. ?

## 2021-07-02 ENCOUNTER — Other Ambulatory Visit: Payer: Self-pay | Admitting: Family Medicine

## 2021-07-19 ENCOUNTER — Other Ambulatory Visit: Payer: Self-pay | Admitting: Family Medicine

## 2021-08-15 ENCOUNTER — Encounter: Payer: Self-pay | Admitting: Primary Care

## 2021-08-15 ENCOUNTER — Other Ambulatory Visit: Payer: Self-pay | Admitting: Primary Care

## 2021-08-15 ENCOUNTER — Ambulatory Visit (INDEPENDENT_AMBULATORY_CARE_PROVIDER_SITE_OTHER): Payer: 59 | Admitting: Primary Care

## 2021-08-15 VITALS — BP 134/84 | HR 88 | Temp 97.4°F | Ht 70.5 in | Wt 254.0 lb

## 2021-08-15 DIAGNOSIS — R051 Acute cough: Secondary | ICD-10-CM | POA: Diagnosis not present

## 2021-08-15 MED ORDER — FLUTICASONE PROPIONATE 50 MCG/ACT NA SUSP: 1.0000 | Freq: Two times a day (BID) | NASAL | 0 refills | Status: DC

## 2021-08-15 MED ORDER — DOXYCYCLINE HYCLATE 100 MG PO TABS: 100.0000 mg | ORAL_TABLET | Freq: Two times a day (BID) | ORAL | 0 refills | Status: DC

## 2021-08-15 MED ORDER — BENZONATATE 200 MG PO CAPS: 200.0000 mg | ORAL_CAPSULE | Freq: Three times a day (TID) | ORAL | 0 refills | Status: DC | PRN

## 2021-08-15 NOTE — Patient Instructions (Addendum)
Start Doxycycline antibiotic for the infection. Take 1 tablet by mouth twice daily for 7 days.  Nasal Congestion/Ear Pressure/Sinus Pressure: Try using Flonase (fluticasone) nasal spray. Instill 1 spray in each nostril twice daily.   You may take Benzonatate capsules for cough. Take 1 capsule by mouth three times daily as needed for cough.  It was a pleasure meeting you!

## 2021-08-15 NOTE — Progress Notes (Signed)
Subjective:    Patient ID: DAVISHA LINTHICUM, female    DOB: 1965/06/30, 56 y.o.   MRN: [id number]  URI  Associated symptoms include congestion and coughing. Pertinent negatives include no chest pain or sore throat.    Parthenia Tellefsen Salazar is a very pleasant 56 y.o. female patient of Dr. Lurlean Nanny with a history of hypertension, allergic rhinitis, type 2 diabetes, thyrotoxicosis who presents today to discuss URI symptoms.  Symptom onset about 8 days ago with symptoms of scratchy throat. She then developed head congestion and nasal congestion, cough which is now productive (yellow), ear pressure, rhinorrhea, sneezing, fatigue.   She's been drinking hot liquids, she used Atrovent nasal spray for a few days, Xyzal 5 mg, Singulair 10 mg   She denies fevers, shortness of breath. Overall she's feeling about the same. Her husband was sick with the same symptoms about 2 weeks ago, treated with antibiotics and improved.    Review of Systems  Constitutional:  Positive for chills and fatigue. Negative for fever.  HENT:  Positive for congestion and postnasal drip. Negative for sore throat.   Respiratory:  Positive for cough. Negative for shortness of breath.   Cardiovascular:  Negative for chest pain.         Past Medical History:  Diagnosis Date   Diabetes mellitus type 2 in obese (HCC) 04/14/2015   Hypertension    Hypothyroid    Positive PPD, treated    9 months of treatment    Social History   Socioeconomic History   Marital status: Married    Spouse name: Not on file   Number of children: Not on file   Years of education: Not on file   Highest education level: Not on file  Occupational History   Occupation: Geophysical data processor    Employer: [employer]    Comment: [employer]  Tobacco Use   Smoking status: Never    Passive exposure: Past (as a child)   Smokeless tobacco: Never  Vaping Use   Vaping Use: Never used  Substance and Sexual  Activity   Alcohol use: No   Drug use: No   Sexual activity: Not Currently    Partners: Male    Birth control/protection: Other-see comments    Comment: husband had vasectomy  Other Topics Concern   Not on file  Social History Narrative   Not on file   Social Determinants of Health   Financial Resource Strain: Not on file  Food Insecurity: Not on file  Transportation Needs: Not on file  Physical Activity: Not on file  Stress: Not on file  Social Connections: Not on file  Intimate Partner Violence: Not on file    Past Surgical History:  Procedure Laterality Date   CHOLECYSTECTOMY  1999   TONSILLECTOMY  1995    Family History  Problem Relation Age of Onset   Stroke Other    Diabetes Other    Cancer Mother    Heart disease Mother    Alcohol abuse Neg Hx    Mental illness Neg Hx     Allergies  Allergen Reactions   Penicillins     REACTION: rash   Ace Inhibitors Cough   Clarithromycin Nausea Only    REACTION: Extreme nausea   Diclofenac Nausea Only    Current Outpatient Medications on File Prior to Visit  Medication Sig Dispense Refill   amLODipine (NORVASC) 10 MG tablet TAKE 1 TABLET(10 MG) BY MOUTH DAILY 90 tablet 1  atorvastatin (LIPITOR) 20 MG tablet Take 1 tablet (20 mg total) by mouth daily. 90 tablet 3   GLIPIZIDE XL 10 MG 24 hr tablet TAKE 1 TABLET(10 MG) BY MOUTH DAILY WITH BREAKFAST 30 tablet 5   ipratropium (ATROVENT) 0.03 % nasal spray Spray two sprays in each nostril up to four times daily as needed 30 mL 12   levocetirizine (XYZAL) 5 MG tablet Take 1 tablet (5 mg total) by mouth daily. 30 tablet 0   metFORMIN (GLUCOPHAGE-XR) 500 MG 24 hr tablet TAKE 4 TABLETS(2000 MG) BY MOUTH DAILY WITH BREAKFAST 360 tablet 1   montelukast (SINGULAIR) 10 MG tablet Take 1 tablet (10 mg total) by mouth at bedtime. 30 tablet 0   Multiple Vitamin (MULTIVITAMIN) tablet Take 1 tablet by mouth daily.     ondansetron (ZOFRAN-ODT) 4 MG disintegrating tablet DISSOLVE 1  TABLET(4 MG) ON THE TONGUE EVERY 6 HOURS AS NEEDED FOR NAUSEA 30 tablet 1   No current facility-administered medications on file prior to visit.    BP 134/84 (BP Location: Left Arm, Patient Position: Sitting, Cuff Size: Large)   Pulse 88   Temp (!) 97.4 F (36.3 C)   Ht 5' 10.5" (1.791 m)   Wt 254 lb (115.2 kg)   SpO2 98%   BMI 35.93 kg/m  Objective:   Physical Exam Constitutional:      General: She is not in acute distress.    Appearance: She is ill-appearing.  HENT:     Right Ear: Tympanic membrane and ear canal normal.     Left Ear: Tympanic membrane and ear canal normal.     Nose:     Right Sinus: No maxillary sinus tenderness or frontal sinus tenderness.     Left Sinus: No maxillary sinus tenderness or frontal sinus tenderness.     Mouth/Throat:     Pharynx: No posterior oropharyngeal erythema.  Eyes:     Conjunctiva/sclera: Conjunctivae normal.  Cardiovascular:     Rate and Rhythm: Normal rate and regular rhythm.  Pulmonary:     Effort: Pulmonary effort is normal.     Breath sounds: Examination of the right-upper field reveals rhonchi. Examination of the left-upper field reveals rhonchi. Examination of the left-lower field reveals rhonchi. Rhonchi present. No decreased breath sounds or wheezing.  Musculoskeletal:     Cervical back: Neck supple.  Lymphadenopathy:     Cervical: No cervical adenopathy.  Skin:    General: Skin is warm and dry.           Assessment & Plan:   Problem List Items Addressed This Visit       Other   Acute cough - Primary    Appears acutely ill today, stable for outpatient treatment.  Given duration of treatment, coupled with presentation, will treat for presumed bacterial involvement.  Allergy to PCN and clarithromycin.  Start Doxycycline 100 mg BID x 7 days. Start Flonase nasal spray. Rx for [employer] provided to use TID PRN.  Return precautions provided.       Relevant Medications   doxycycline (VIBRA-TABS) 100  MG tablet   fluticasone (FLONASE) 50 MCG/ACT nasal spray   benzonatate (TESSALON) 200 MG capsule       Doreene Nest, NP

## 2021-08-15 NOTE — Assessment & Plan Note (Signed)
Appears acutely ill today, stable for outpatient treatment.  Given duration of treatment, coupled with presentation, will treat for presumed bacterial involvement.  Allergy to PCN and clarithromycin.  Start Doxycycline 100 mg BID x 7 days. Start Flonase nasal spray. Rx for [employer] provided to use TID PRN.  Return precautions provided.

## 2021-09-11 DIAGNOSIS — M25511 Pain in right shoulder: Secondary | ICD-10-CM | POA: Diagnosis not present

## 2021-09-19 ENCOUNTER — Ambulatory Visit (INDEPENDENT_AMBULATORY_CARE_PROVIDER_SITE_OTHER): Payer: 59 | Admitting: Family Medicine

## 2021-09-19 ENCOUNTER — Encounter: Payer: Self-pay | Admitting: Family Medicine

## 2021-09-19 VITALS — BP 147/95 | HR 90 | Ht 74.0 in | Wt 252.0 lb

## 2021-09-19 DIAGNOSIS — Z01419 Encounter for gynecological examination (general) (routine) without abnormal findings: Secondary | ICD-10-CM | POA: Diagnosis not present

## 2021-09-19 NOTE — Progress Notes (Signed)
Patient presents for Annual Exam   Last pap:04/23/19 WNL per result notes next pap in 5 yrs. Mammogram:11/2020 WNL  STD Screening: Declined   CC: None

## 2021-09-19 NOTE — Progress Notes (Signed)
   GYNECOLOGY ANNUAL PREVENTATIVE CARE ENCOUNTER NOTE  Subjective:   Carrie Ellis is a 56 y.o. G14P2002 female here for a routine annual gynecologic exam.    Current complaints:  Chief Complaint  Patient presents with   Gynecologic Exam     Denies abnormal vaginal bleeding, discharge, pelvic pain, problems with intercourse or other gynecologic concerns.    Gynecologic History No LMP recorded. (Menstrual status: Perimenopausal). Contraception: post menopausal status Last Pap: 2021. Results were: normal Last mammogram: 2022. Results were: normal  Health Maintenance Due  Topic Date Due   FOOT EXAM  04/29/2019   Zoster Vaccines- Shingrix (2 of 2) 06/22/2021   OPHTHALMOLOGY EXAM  07/06/2021   INFLUENZA VACCINE  09/13/2021    The following portions of the patient's history were reviewed and updated as appropriate: allergies, current medications, past family history, past medical history, past social history, past surgical history and problem list.  Review of Systems Pertinent items are noted in HPI.   Objective:  BP (!) 147/95   Pulse 90   Ht 6\' 2"  (1.88 m)   Wt 252 lb (114.3 kg)   BMI 32.35 kg/m  CONSTITUTIONAL: Well-developed, well-nourished female in no acute distress.  HENT:  Normocephalic, atraumatic, External right and left ear normal. Oropharynx is clear and moist EYES:  No scleral icterus.  NECK: Normal range of motion, supple, no masses.  Normal thyroid.  SKIN: Skin is warm and dry. No rash noted. Not diaphoretic. No erythema. No pallor. NEUROLOGIC: Alert and oriented to person, place, and time. Normal reflexes, muscle tone coordination. No cranial nerve deficit noted. PSYCHIATRIC: Normal mood and affect. Normal behavior. Normal judgment and thought content. CARDIOVASCULAR: Normal heart rate noted, regular rhythm. 2+ distal pulses. RESPIRATORY: Effort and breath sounds normal, no problems with respiration noted. BREASTS: Symmetric in size. No masses, skin  changes, nipple drainage, or lymphadenopathy. ABDOMEN: Soft,  no distention noted.  No tenderness, rebound or guarding.  PELVIC: Normal appearing external genitalia; Mildly atrophic vaginal mucosa and cervix. No abnormal discharge noted.  Normal uterine size, no other palpable masses, no uterine or adnexal tenderness. MUSCULOSKELETAL: Normal range of motion.     Assessment and Plan:  1) Annual gynecologic examination - pap not indicated:  Will follow up results of pap smear and manage accordingly. STI screening desired No.  Routine preventative health maintenance measures emphasized. Reviewed perimenopausal symptoms and management.   1. Well woman exam with routine gynecological exam Pap up to date, next in 2026 CBE WNL Get CRC screening through PCP. Last cologuard was negative.  Planning on weight loss this next year and desires to be a better version of herself. Recommended patient read 2027 [employer]. - MM 3D SCREEN BREAST BILATERAL; Future   Please refer to After Visit Summary for other counseling recommendations.   No follow-ups on file.  Dewain Penning, MD, MPH, ABFM Attending Physician Center for [facility]

## 2021-10-03 ENCOUNTER — Other Ambulatory Visit: Payer: Self-pay | Admitting: Family Medicine

## 2021-10-30 NOTE — Progress Notes (Unsigned)
    Carrie Stratton T. Melaysia Streed, MD, [place] at [facility] [place], [address]  Phone: [phone]  FAX: [phone]  Carrie Ellis - 56 y.o. female  MRN [id number]  Date of Birth: July 18, 1965  Date: 10/31/2021  PCP: Owens Loffler, MD  Referral: Owens Loffler, MD  No chief complaint on file.  Subjective:   Carrie Ellis is a 56 y.o. very pleasant female patient with There is no height or weight on file to calculate BMI. who presents with the following:  She is here to follow-up about her diabetes and high blood pressure.  Diabetes Mellitus: Tolerating Medications: yes Compliance with diet: fair, There is no height or weight on file to calculate BMI. Exercise: minimal / intermittent Avg blood sugars at home: not checking Foot problems: none Hypoglycemia: none No nausea, vomitting, blurred vision, polyuria.  Lab Results  Component Value Date   HGBA1C 7.5 (H) 04/20/2021   HGBA1C 9.4 (A) 12/20/2020   HGBA1C 7.1 (A) 05/17/2020   Lab Results  Component Value Date   MICROALBUR 20.0 (H) 04/20/2021   LDLCALC 92 04/20/2021   CREATININE 0.75 04/20/2021    Wt Readings from Last 3 Encounters:  09/19/21 252 lb (114.3 kg)  08/15/21 254 lb (115.2 kg)  04/27/21 254 lb (115.2 kg)    HTN: Tolerating all medications without side effects Stable and at goal No CP, no sob. No HA.  BP Readings from Last 3 Encounters:  09/19/21 (!) 147/95  08/15/21 134/84  04/27/21 (!) 320/23    Basic Metabolic Panel:    Component Value Date/Time   NA 139 04/20/2021 0806   K 4.2 04/20/2021 0806   CL 102 04/20/2021 0806   CO2 28 04/20/2021 0806   BUN 10 04/20/2021 0806   CREATININE 0.75 04/20/2021 0806   CREATININE 0.79 09/18/2016 1117   GLUCOSE 160 (H) 04/20/2021 0806   CALCIUM 9.4 04/20/2021 0806     Review of Systems is noted in the HPI, as appropriate  Objective:   There were no vitals taken for this  visit.  GEN: No acute distress; alert,appropriate. PULM: Breathing comfortably in no respiratory distress PSYCH: Normally interactive.   Laboratory and Imaging Data:  Assessment and Plan:   ***

## 2021-10-31 ENCOUNTER — Encounter: Payer: Self-pay | Admitting: Family Medicine

## 2021-10-31 ENCOUNTER — Ambulatory Visit (INDEPENDENT_AMBULATORY_CARE_PROVIDER_SITE_OTHER): Payer: 59 | Admitting: Family Medicine

## 2021-10-31 VITALS — BP 120/70 | HR 107 | Temp 98.2°F | Ht 70.5 in | Wt 252.1 lb

## 2021-10-31 DIAGNOSIS — Z23 Encounter for immunization: Secondary | ICD-10-CM

## 2021-10-31 DIAGNOSIS — E669 Obesity, unspecified: Secondary | ICD-10-CM | POA: Diagnosis not present

## 2021-10-31 DIAGNOSIS — I10 Essential (primary) hypertension: Secondary | ICD-10-CM | POA: Diagnosis not present

## 2021-10-31 DIAGNOSIS — E785 Hyperlipidemia, unspecified: Secondary | ICD-10-CM

## 2021-10-31 DIAGNOSIS — R110 Nausea: Secondary | ICD-10-CM

## 2021-10-31 DIAGNOSIS — E1169 Type 2 diabetes mellitus with other specified complication: Secondary | ICD-10-CM | POA: Insufficient documentation

## 2021-10-31 HISTORY — DX: Type 2 diabetes mellitus with other specified complication: E11.69

## 2021-10-31 LAB — POCT GLYCOSYLATED HEMOGLOBIN (HGB A1C): Hemoglobin A1C: 7.9 % — AB (ref 4.0–5.6)

## 2021-10-31 MED ORDER — ONDANSETRON 4 MG PO TBDP: ORAL_TABLET | ORAL | 1 refills | Status: DC

## 2021-10-31 MED ORDER — GLIPIZIDE ER 10 MG PO TB24
20.0000 mg | ORAL_TABLET | Freq: Every day | ORAL | 3 refills | Status: DC
Start: 2021-10-31 — End: 2022-11-01

## 2021-10-31 NOTE — Addendum Note (Signed)
Addended by: Carter Kitten on: 10/31/2021 09:30 AM   Modules accepted: Orders

## 2021-11-18 ENCOUNTER — Ambulatory Visit
Admission: RE | Admit: 2021-11-18 | Discharge: 2021-11-18 | Disposition: A | Discharge status: Home / Self Care | Payer: 59 | Source: Ambulatory Visit | Attending: Family Medicine | Admitting: Family Medicine

## 2021-11-18 DIAGNOSIS — Z01419 Encounter for gynecological examination (general) (routine) without abnormal findings: Secondary | ICD-10-CM

## 2021-12-07 ENCOUNTER — Encounter: Payer: Self-pay | Admitting: Internal Medicine

## 2021-12-07 ENCOUNTER — Ambulatory Visit (INDEPENDENT_AMBULATORY_CARE_PROVIDER_SITE_OTHER): Payer: 59 | Admitting: Internal Medicine

## 2021-12-07 VITALS — BP 136/82 | HR 108 | Ht 70.5 in | Wt 254.2 lb

## 2021-12-07 DIAGNOSIS — E0590 Thyrotoxicosis, unspecified without thyrotoxic crisis or storm: Secondary | ICD-10-CM

## 2021-12-07 DIAGNOSIS — E042 Nontoxic multinodular goiter: Secondary | ICD-10-CM

## 2021-12-07 LAB — T3, FREE: T3, Free: 3.4 pg/mL (ref 2.3–4.2)

## 2021-12-07 LAB — TSH: TSH: 1.28 u[IU]/mL (ref 0.35–5.50)

## 2021-12-07 LAB — T4, FREE: Free T4: 1.14 ng/dL (ref 0.60–1.60)

## 2021-12-07 NOTE — Progress Notes (Signed)
Patient ID: Carrie Ellis, female   DOB: 04-Nov-1965, 56 y.o.   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  Last visit 1 year ago.  Interim hx: She is feeling well at this visit, without complaints.   No tremors, palpitations, heat intolerance, unintentional weight loss.  Reviewed history: Pt has had a low TSH since 1990s >> saw Dr. Jeanann Ellis >> was on medications >> then TFTs normalized >> stopped the medication (cannot remember name).   After this, she started to see Dr. Edilia Ellis and her TFTs started to become abnormal again in 2017.  Reviewed her TFTs: Lab Results  Component Value Date   TSH 1.92 04/20/2021   TSH 1.59 12/07/2020   TSH 1.20 12/04/2019   TSH 1.14 06/04/2019   TSH 1.44 03/31/2019   TSH 0.81 01/31/2019   TSH <0.01 (L) 12/12/2018   TSH <0.01 (L) 08/08/2018   TSH <0.01 (L) 01/31/2018   TSH 0.08 (L) 07/10/2017   FREET4 1.04 04/20/2021   FREET4 1.15 12/07/2020   FREET4 1.09 12/04/2019   FREET4 0.99 06/04/2019   FREET4 0.95 03/31/2019   FREET4 0.91 01/31/2019   FREET4 1.25 12/12/2018   FREET4 1.02 08/08/2018   FREET4 1.17 01/31/2018   FREET4 0.87 07/10/2017   Lab Results  Component Value Date   T3FREE 3.6 04/20/2021   T3FREE 3.0 12/07/2020   T3FREE 3.4 12/04/2019   T3FREE 3.3 06/04/2019   T3FREE 3.1 03/31/2019   T3FREE 3.0 01/31/2019   T3FREE 3.3 12/12/2018   T3FREE 3.8 08/08/2018   T3FREE 4.3 (H) 01/31/2018   T3FREE 3.3 07/10/2017   2011: TSH 0.29, normal free hormones  Her TSI antibodies were not elevated: Lab Results  Component Value Date   TSI <89 11/09/2016   Reviewed the rest of the investigation and management: Thyroid ultrasound (04/20/2006 compared with 05/13/2004) showed multinodular goiter with slight interval increase in size of the right inferior thyroid nodule and left inferior thyroid  nodule, but otherwise stable size of the nodules, with slightly enlarged thyroid.  Thyroid uptake and scan (11/28/2016): She had toxic bilateral thyroid adenomas. Hyper functional BILATERAL thyroid nodules with suppression of uptake in remaining thyroid tissue compatible with hyper functional thyroid adenomas. 24 hour radio iodine uptake of 26%.  She had a hard time deciding whether to have the RAI treatment or not and she finally decided for this in 2020:  Thyroid uptake (09/24/2018): elevated: 21% (5 to 15%), 37.2% at 24 hours (10 to 30%).  RAI treatment (11/04/2018): 31.2 mCi  Thyroid ultrasound (12/17/2019): Parenchymal Echotexture: Moderately heterogenous Isthmus: 0.9 cm Right lobe: 5.6 cm x 2.0 cm x 2.1 cm Left lobe: 6.1 cm x 1.4 cm x 2.2 cm __________________________________________________________________   Nodule # 1: Location: Right; Inferior Maximum size: 3.0 cm; Other 2 dimensions: 1.8 cm x 1.5 cm. This nodule has decreased in size from the prior of 3.9 cm in 2008  Composition: mixed cystic and solid (1) Echogenicity: hypoechoic (2) ACR TI-RADS recommendations: Nodule meets criteria for biopsy  ________________________________________________________   Nodule # 2: Location: Isthmus; mid Maximum size: 1.1 cm; Other 2 dimensions: 0.9 cm x 0.8 cm Composition: solid/almost completely solid (2) Echogenicity: hyperechoic (1) Shape: taller-than-wide (3) ACR TI-RADS recommendations: Nodule meets criteria for surveillance _________________________________________________________   Nodule # 3:  Location: Isthmus; mid Maximum size: 1.4 cm; Other 2 dimensions: 1.2 cm x 0.8 cm Composition:  cannot determine (2)  Echogenicity: hypoechoic (2) ACR TI-RADS recommendations: Nodule meets criteria for surveillance _________________________________________________________   Nodule # 4:  Location: Left; Superior  Maximum size: 0.8 cm; Other 2 dimensions: 0.7 cm x 0.4  cm Composition: cannot determine (2) Echogenicity: isoechoic (1) ACR TI-RADS recommendations: Nodule does not meet criteria for surveillance or biopsy  ________________________________________________________   Nodule # 5: Location: Left; Mid Maximum size: 0.6 cm; Other 2 dimensions: 0.6 cm x 0.6 cm Composition: cannot determine (2) Echogenicity: hypoechoic (2) ACR TI-RADS recommendations:  Nodule does not meet criteria for surveillance or biopsy _________________________________________________________   No adenopathy   IMPRESSION: Multinodular thyroid.   Right inferior nodule, (labeled 1, 3.0 cm, TR 3) if a thyroid nodule and not parathyroid, does meet criteria for biopsy. However, the nodule has decreased to 3.0 cm from a prior 3.9 cm, suggesting benign natural history.  If there is motivation for biopsy, would also suggest consideration first of nuclear medicine sestamibi scan, as this could represent a parathyroid gland/adenoma given the location and appearance, versus biopsy.   Isthmic thyroid nodule (labeled 2, 1.1 cm, TR 4) and the isthmic nodule (labeled 3, 1.4 cm, TR 4) both meet criteria for surveillance, as designated by the newly established ACR TI-RADS criteria. Surveillance ultrasound study recommended to be performed annually up to 5 years.  Thyroid nodule FNA (12/25/2019):  A. THYROID, RLP, FINE NEEDLE ASPIRATION:   FINAL MICROSCOPIC DIAGNOSIS:  - Consistent with benign follicular nodule (Bethesda category II)   SPECIMEN ADEQUACY:  Satisfactory for evaluation    Thyroid U/S (12/24/2020): Parenchymal Echotexture: Mildly heterogeneous  Isthmus: 0.6 cm  Right lobe: 5.0 x 1.6 x 2.0 cm  Left lobe: 6.1 x 1.6 x 1.9 cm _______________________________________________________________________   Nodule 1: 0.9 x 0.8 x 0.6 cm solid isoechoic nodule within the isthmus is slightly smaller since prior examination. It is not meet criteria for FNA or imaging  surveillance.   _________________________________________________________   Nodule 2: 0.9 x 0.9 x 0.7 cm solid hypoechoic nodule in the inferior left thyroid lobe has decreased in size since the prior examination and does not meet criteria for FNA or imaging surveillance.   _________________________________________________________   Nodule 3: 2.7 x 1.6 x 1.0 cm right inferior thyroid nodule is not significantly changed in size since prior examination. Prior FNA was performed on 12/25/2019. Please correlate with FNA results.   _________________________________________________________   Nodule 4: 0.9 x 0.6 x 0.5 cm isoechoic solid nodule in the superior left thyroid lobe is not significantly changed in size since prior examination. It does not meet criteria for imaging surveillance or FNA.   _________________________________________________________   Nodule 5: 0.7 x 0.7 x 0.6 cm solid hypoechoic nodule in the mid left thyroid lobe does not demonstrate threshold growth since 04/20/2006 where it measured 1.1 x 1.0 x 0.6 cm. This is consistent with a benign etiology.   IMPRESSION: 1. Previously biopsied right inferior thyroid nodule is not significantly changed in size. Please correlate with prior FNA results. 2. Remaining thyroid nodules do not meet criteria for FNA or imaging surveillance.  Pt denies: - feeling nodules in neck - hoarseness - dysphagia - choking  Pt does have a FH of thyroid ds - mother (hypothyroidism). No FH of thyroid cancer. No h/o radiation tx to head or neck except for RAI treatment 11/04/2018. No herbal supplements. No Biotin use. No recent steroids use.   Pt. also has a history of HL, HTN, GERD, DM2.  She takes a multivitamin with Biotin  150 mcg daily.  ROS: + see HPI  I reviewed pt's medications, allergies, PMH, social hx, family hx, and changes were documented in the history of present illness. Otherwise, unchanged from my initial visit  note.  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 90 tablet 3   fluticasone (FLONASE) 50 MCG/ACT nasal spray SHAKE LIQUID AND USE 1 SPRAY IN EACH NOSTRIL TWICE DAILY 48 g 0   glipiZIDE (GLIPIZIDE XL) 10 MG 24 hr tablet Take 2 tablets (20 mg total) by mouth daily with breakfast. 180 tablet 3   ipratropium (ATROVENT) 0.03 % nasal spray Spray two sprays in each nostril up to four times daily as needed 30 mL 12   levocetirizine (XYZAL) 5 MG tablet Take 1 tablet (5 mg total) by mouth daily. 30 tablet 0   metFORMIN (GLUCOPHAGE-XR) 500 MG 24 hr tablet TAKE 4 TABLETS(2000 MG) BY MOUTH DAILY WITH BREAKFAST 360 tablet 1   montelukast (SINGULAIR) 10 MG tablet Take 1 tablet (10 mg total) by mouth at bedtime. 30 tablet 0   Multiple Vitamin (MULTIVITAMIN) tablet Take 1 tablet by mouth daily.     ondansetron (ZOFRAN-ODT) 4 MG disintegrating tablet DISSOLVE 1 TABLET(4 MG) ON THE TONGUE  EVERY 6 HOURS AS NEEDED FOR NAUSEA 30 tablet 1   No current facility-administered medications on file prior to visit.   Allergies  Allergen Reactions   Penicillins     REACTION: rash   Ace Inhibitors Cough   Clarithromycin Nausea Only    REACTION: Extreme nausea   Diclofenac Nausea Only   Family History  Problem Relation Age of Onset   Cancer Mother    Heart disease Mother    Stroke Other    Diabetes Other    Alcohol abuse Neg Hx    Mental illness Neg Hx    Breast cancer Neg Hx    PE: BP 136/82 (BP Location: Right Arm, Patient Position: Sitting, Cuff Size: Normal)   Pulse (!) 108   Ht 5' 10.5" (1.791 m)   Wt 254 lb 3.2 oz (115.3 kg)   LMP 09/24/2019 (Exact Date)   SpO2 98%   BMI 35.96 kg/m   Wt Readings from Last 3 Encounters:  12/07/21 254 lb 3.2 oz (115.3 kg)  10/31/21 252 lb 2 oz (114.4 kg)  09/19/21  252 lb (114.3 kg)   Constitutional: overweight, in NAD Eyes:  EOMI, no exophthalmos ENT: no neck masses, no cervical lymphadenopathy Cardiovascular: Tachycardia, RR, No MRG Respiratory: CTA B Musculoskeletal: no deformities Skin:no rashes Neurological: no tremor with outstretched hands  ASSESSMENT: 1. Subclinical Thyrotoxicosis  2. MNG  3. DM2 -per PCP  PLAN:  1. Patient with a long history of subclinical thyrotoxicosis without thyrotoxic symptoms: No weight loss, heat intolerance, palpitations, anxiety.  She has whitecoat tachycardia.  Her heart rate checked at home with her Fitbit was usually not elevated. -Her thyroid ultrasound showed a multinodular goiter and a thyroid uptake and scan showed that 2 of the thyroid nodules are hyperfunctioning.  She had RAI treatment on 11/04/2018.  She felt better after the treatment and her TFTs normalized. -At last visit, we checked her TFTs and they were normal and she had another TSH normal in 04/2021 -We discussed about how to take levothyroxine correctly in case we need to start.  She is taking multivitamins, and will  need to separate these by at least 4 hours from levothyroxine. -We will repeat her TFTs today -At today's visit she is tachycardic, but she is nervous as she has a presentation for work this morning. -I will see her back in a year  2. MNG -The nodules appears to be hyperfunctioning on the thyroid uptake and scan, but she had RAI treatment for these -Thyroid ultrasound from 12/17/2019 showed a right inferior nodule for which FNA was recommended (FNA from 12/25/2019 was benign).  Follow-up was recommended for the isthmic nodule. -After last visit, we checked another thyroid ultrasound and this showed stable nodules -no neck compression symptoms -We will continue to follow her expectantly for now -plan to repeat the ultrasound in 1 to 2 years.  3. DM2 -Managed by PCP -At last visit, we checked her HbA1c by mistake.  This was 9.3%, much higher -At that time, she was not checking blood sugars at home and I advised her to restart.  I gave her blood sugar logs.  She has an appointment coming up with PCP. -Her HbA1c improved since then: Lab Results  Component Value Date   HGBA1C 7.9 (A) 10/31/2021  -Continue to follow-up with PCP for this problem  Component     Latest Ref Rng 12/07/2021  TSH     0.35 - 5.50 uIU/mL 1.28   T4,Free(Direct)     0.60 - 1.60 ng/dL 1.14   Triiodothyronine,Free,Serum     2.3 - 4.2 pg/mL 3.4    Philemon Kingdom, MD PhD [facility] End[facility]

## 2021-12-07 NOTE — Patient Instructions (Addendum)
Please stop at the lab.  Please come back for a follow-up appointment in 1 year.  

## 2021-12-26 ENCOUNTER — Other Ambulatory Visit: Payer: Self-pay | Admitting: Family Medicine

## 2022-01-17 NOTE — Progress Notes (Unsigned)
    Carrie Chretien T. Topaz Raglin, MD, CAQ Sports Medicine [facility] [address] [place], [address]  Phone: [phone]  FAX: [phone]  AVEREY Carrie Ellis - 56 y.o. female  MRN [id number]  Date of Birth: [birth year]  Date: 01/18/2022  PCP: Hannah Beat, MD  Referral: Hannah Beat, MD  No chief complaint on file.  Subjective:   Carrie Ellis is a 56 y.o. very pleasant female patient with There is no height or weight on file to calculate BMI. who presents with the following:  She presents with acute illness including nasal congestion and cough.    Review of Systems is noted in the HPI, as appropriate  Objective:   LMP 09/24/2019 (Exact Date)   GEN: No acute distress; alert,appropriate. PULM: Breathing comfortably in no respiratory distress PSYCH: Normally interactive.   Laboratory and Imaging Data:  Assessment and Plan:   ***

## 2022-01-18 ENCOUNTER — Ambulatory Visit (INDEPENDENT_AMBULATORY_CARE_PROVIDER_SITE_OTHER): Payer: 59 | Admitting: Family Medicine

## 2022-01-18 ENCOUNTER — Encounter: Payer: Self-pay | Admitting: Family Medicine

## 2022-01-18 VITALS — BP 128/80 | HR 106 | Temp 98.3°F | Ht 70.5 in | Wt 253.4 lb

## 2022-01-18 DIAGNOSIS — J208 Acute bronchitis due to other specified organisms: Secondary | ICD-10-CM

## 2022-01-18 MED ORDER — GUAIFENESIN-CODEINE 100-10 MG/5ML PO SYRP: 5.0000 mL | ORAL_SOLUTION | Freq: Three times a day (TID) | ORAL | 0 refills | Status: DC | PRN

## 2022-01-18 MED ORDER — BENZONATATE 200 MG PO CAPS: 200.0000 mg | ORAL_CAPSULE | Freq: Three times a day (TID) | ORAL | 1 refills | Status: DC | PRN

## 2022-01-18 MED ORDER — DOXYCYCLINE HYCLATE 100 MG PO TABS: 100.0000 mg | ORAL_TABLET | Freq: Two times a day (BID) | ORAL | 0 refills | Status: DC

## 2022-01-24 ENCOUNTER — Telehealth: Payer: 59 | Admitting: Physician Assistant

## 2022-01-24 DIAGNOSIS — U071 COVID-19: Secondary | ICD-10-CM

## 2022-01-24 MED ORDER — MOLNUPIRAVIR EUA 200MG CAPSULE: 4.0000 | ORAL_CAPSULE | Freq: Two times a day (BID) | ORAL | 0 refills | Status: AC

## 2022-01-24 NOTE — Patient Instructions (Addendum)
Merlene Laughter Fennelly, thank you for joining Piedad Climes, PA-C for today's virtual visit.  While this provider is not your primary care provider (PCP), if your PCP is located in our provider database this encounter information will be shared with them immediately following your visit.   A [facility] MyChart account gives you access to today's visit and all your visits, tests, and labs performed at [facility] " click here if you don't have a [facility] MyChart account or go to [email or web address]  Consent: (Patient) Analeya Luallen Teagle provided verbal consent for this virtual visit at the beginning of the encounter.  Current Medications:  Current Outpatient Medications:    amLODipine (NORVASC) 10 MG tablet, TAKE 1 TABLET(10 MG) BY MOUTH DAILY, Disp: 90 tablet, Rfl: 1   atorvastatin (LIPITOR) 20 MG tablet, Take 1 tablet (20 mg total) by mouth daily., Disp: 90 tablet, Rfl: 3   benzonatate (TESSALON) 200 MG capsule, Take 1 capsule (200 mg total) by mouth 3 (three) times daily as needed for cough., Disp: 40 capsule, Rfl: 1   doxycycline (VIBRA-TABS) 100 MG tablet, Take 1 tablet (100 mg total) by mouth 2 (two) times daily., Disp: 20 tablet, Rfl: 0   fluticasone (FLONASE) 50 MCG/ACT nasal spray, SHAKE LIQUID AND USE 1 SPRAY IN EACH NOSTRIL TWICE DAILY, Disp: 48 g, Rfl: 0   glipiZIDE (GLIPIZIDE XL) 10 MG 24 hr tablet, Take 2 tablets (20 mg total) by mouth daily with breakfast., Disp: 180 tablet, Rfl: 3   guaiFENesin-codeine (ROBITUSSIN AC) 100-10 MG/5ML syrup, Take 5 mLs by mouth 3 (three) times daily as needed for cough., Disp: 120 mL, Rfl: 0   ipratropium (ATROVENT) 0.03 % nasal spray, Spray two sprays in each nostril up to four times daily as needed, Disp: 30 mL, Rfl: 12   levocetirizine (XYZAL) 5 MG tablet, Take 1 tablet (5 mg total) by mouth daily., Disp: 30 tablet, Rfl: 0   metFORMIN (GLUCOPHAGE-XR) 500 MG 24 hr tablet, TAKE 4 TABLETS(2000 MG) BY MOUTH DAILY WITH  BREAKFAST, Disp: 360 tablet, Rfl: 1   montelukast (SINGULAIR) 10 MG tablet, Take 1 tablet (10 mg total) by mouth at bedtime., Disp: 30 tablet, Rfl: 0   Multiple Vitamin (MULTIVITAMIN) tablet, Take 1 tablet by mouth daily., Disp: , Rfl:    ondansetron (ZOFRAN-ODT) 4 MG disintegrating tablet, DISSOLVE 1 TABLET(4 MG) ON THE TONGUE EVERY 6 HOURS AS NEEDED FOR NAUSEA, Disp: 30 tablet, Rfl: 1   Medications ordered in this encounter:  No orders of the defined types were placed in this encounter.    *If you need refills on other medications prior to your next appointment, please contact your pharmacy*  Follow-Up: Call back or seek an in-person evaluation if the symptoms worsen or if the condition fails to improve as anticipated.  [facility] [phone]  Other Instructions Please keep well-hydrated and get plenty of rest. Start a saline nasal rinse to flush out your nasal passages. You can use plain Mucinex to help thin congestion. If you have a humidifier, running in the bedroom at night. I want you to start OTC vitamin D3 1000 units daily, vitamin C 1000 mg daily, and a zinc supplement. Please take prescribed medications as directed.  You have been enrolled in a MyChart symptom monitoring program. Please answer these questions daily so we can keep track of how you are doing.  You were to quarantine for 5 days from onset of your symptoms.  After day 5, if you have had  no fever and you are feeling better, you can end quarantine but need to mask for an additional 5 days. After day 5 if you have a fever or are having significant symptoms, please quarantine for full 10 days.  If you note any worsening of symptoms, any significant shortness of breath or any chest pain, please seek ER evaluation ASAP.  Please do not delay care!  COVID-19: What to Do if You Are Sick If you test positive and are an older adult or someone who is at high risk of getting very sick from COVID-19,  treatment may be available. Contact a healthcare provider right away after a positive test to determine if you are eligible, even if your symptoms are mild right now. You can also visit a Test to Treat location and, if eligible, receive a prescription from a provider. Don't delay: Treatment must be started within the first few days to be effective. If you have a fever, cough, or other symptoms, you might have COVID-19. Most people have mild illness and are able to recover at home. If you are sick: Keep track of your symptoms. If you have an emergency warning sign (including trouble breathing), call 911. Steps to help prevent the spread of COVID-19 if you are sick If you are sick with COVID-19 or think you might have COVID-19, follow the steps below to care for yourself and to help protect other people in your home and community. Stay home except to get medical care Stay home. Most people with COVID-19 have mild illness and can recover at home without medical care. Do not leave your home, except to get medical care. Do not visit public areas and do not go to places where you are unable to wear a mask. Take care of yourself. Get rest and stay hydrated. Take over-the-counter medicines, such as acetaminophen, to help you feel better. Stay in touch with your doctor. Call before you get medical care. Be sure to get care if you have trouble breathing, or have any other emergency warning signs, or if you think it is an emergency. Avoid public transportation, ride-sharing, or taxis if possible. Get tested If you have symptoms of COVID-19, get tested. While waiting for test results, stay away from others, including staying apart from those living in your household. Get tested as soon as possible after your symptoms start. Treatments may be available for people with COVID-19 who are at risk for becoming very sick. Don't delay: Treatment must be started early to be effective--some treatments must begin within 5  days of your first symptoms. Contact your healthcare provider right away if your test result is positive to determine if you are eligible. Self-tests are one of several options for testing for the virus that causes COVID-19 and may be more convenient than laboratory-based tests and point-of-care tests. Ask your healthcare provider or your local health department if you need help interpreting your test results. You can visit your state, tribal, local, and territorial health department's website to look for the latest local information on testing sites. Separate yourself from other people As much as possible, stay in a specific room and away from other people and pets in your home. If possible, you should use a separate bathroom. If you need to be around other people or animals in or outside of the home, wear a well-fitting mask. Tell your close contacts that they may have been exposed to COVID-19. An infected person can spread COVID-19 starting 48 hours (or 2 days) before  the person has any symptoms or tests positive. By letting your close contacts know they may have been exposed to COVID-19, you are helping to protect everyone. See COVID-19 and Animals if you have questions about pets. If you are diagnosed with COVID-19, someone from the health department may call you. Answer the call to slow the spread. Monitor your symptoms Symptoms of COVID-19 include fever, cough, or other symptoms. Follow care instructions from your healthcare provider and local health department. Your local health authorities may give instructions on checking your symptoms and reporting information. When to seek emergency medical attention Look for emergency warning signs* for COVID-19. If someone is showing any of these signs, seek emergency medical care immediately: Trouble breathing Persistent pain or pressure in the chest New confusion Inability to wake or stay awake Pale, gray, or blue-colored skin, lips, or nail beds,  depending on skin tone *This list is not all possible symptoms. Please call your medical provider for any other symptoms that are severe or concerning to you. Call 911 or call ahead to your local emergency facility: Notify the operator that you are seeking care for someone who has or may have COVID-19. Call ahead before visiting your doctor Call ahead. Many medical visits for routine care are being postponed or done by phone or telemedicine. If you have a medical appointment that cannot be postponed, call your doctor's office, and tell them you have or may have COVID-19. This will help the office protect themselves and other patients. If you are sick, wear a well-fitting mask You should wear a mask if you must be around other people or animals, including pets (even at home). Wear a mask with the best fit, protection, and comfort for you. You don't need to wear the mask if you are alone. If you can't put on a mask (because of trouble breathing, for example), cover your coughs and sneezes in some other way. Try to stay at least 6 feet away from other people. This will help protect the people around you. Masks should not be placed on young children under age 67 years, anyone who has trouble breathing, or anyone who is not able to remove the mask without help. Cover your coughs and sneezes Cover your mouth and nose with a tissue when you cough or sneeze. Throw away used tissues in a lined trash can. Immediately wash your hands with soap and water for at least 20 seconds. If soap and water are not available, clean your hands with an alcohol-based hand sanitizer that contains at least 60% alcohol. Clean your hands often Wash your hands often with soap and water for at least 20 seconds. This is especially important after blowing your nose, coughing, or sneezing; going to the bathroom; and before eating or preparing food. Use hand sanitizer if soap and water are not available. Use an alcohol-based hand  sanitizer with at least 60% alcohol, covering all surfaces of your hands and rubbing them together until they feel dry. Soap and water are the best option, especially if hands are visibly dirty. Avoid touching your eyes, nose, and mouth with unwashed hands. Handwashing Tips Avoid sharing personal household items Do not share dishes, drinking glasses, cups, eating utensils, towels, or bedding with other people in your home. Wash these items thoroughly after using them with soap and water or put in the dishwasher. Clean surfaces in your home regularly Clean and disinfect high-touch surfaces (for example, doorknobs, tables, handles, light switches, and countertops) in your "sick room" and  bathroom. In shared spaces, you should clean and disinfect surfaces and items after each use by the person who is ill. If you are sick and cannot clean, a caregiver or other person should only clean and disinfect the area around you (such as your bedroom and bathroom) on an as needed basis. Your caregiver/other person should wait as long as possible (at least several hours) and wear a mask before entering, cleaning, and disinfecting shared spaces that you use. Clean and disinfect areas that may have blood, stool, or body fluids on them. Use household cleaners and disinfectants. Clean visible dirty surfaces with household cleaners containing soap or detergent. Then, use a household disinfectant. Use a product from [employer] List N: Disinfectants for Coronavirus (COVID-19). Be sure to follow the instructions on the label to ensure safe and effective use of the product. Many products recommend keeping the surface wet with a disinfectant for a certain period of time (look at "contact time" on the product label). You may also need to wear personal protective equipment, such as gloves, depending on the directions on the product label. Immediately after disinfecting, wash your hands with soap and water for 20 seconds. For  completed guidance on cleaning and disinfecting your home, visit Complete Disinfection Guidance. Take steps to improve ventilation at home Improve ventilation (air flow) at home to help prevent from spreading COVID-19 to other people in your household. Clear out COVID-19 virus particles in the air by opening windows, using air filters, and turning on fans in your home. Use this interactive tool to learn how to improve air flow in your home. When you can be around others after being sick with COVID-19 Deciding when you can be around others is different for different situations. Find out when you can safely end home isolation. For any additional questions about your care, contact your healthcare provider or state or local health department. 05/04/2020 Content source: [facility] (NCIRD), Division of Viral Diseases This information is not intended to replace advice given to you by your health care provider. Make sure you discuss any questions you have with your health care provider. Document Revised: 06/17/2020 Document Reviewed: 06/17/2020 Elsevier Patient Education  2022 [employer]   If you have been instructed to have an in-person evaluation today at a local Urgent Care facility, please use the link below. It will take you to a list of all of our available [facility], including address, phone number and hours of operation. Please do not delay care.  [facility]  If you or a family member do not have a primary care provider, use the link below to schedule a visit and establish care. When you choose a [facility] primary care physician or advanced practice provider, you gain a long-term partner in health. Find a Primary Care Provider  Learn more about [facility]'s in-office and virtual care options: [facility] - Get Care Now

## 2022-01-24 NOTE — Progress Notes (Signed)
Virtual Visit Consent   Carrie Ellis, you are scheduled for a virtual visit with a [facility] provider today. Just as with appointments in the office, your consent must be obtained to participate. Your consent will be active for this visit and any virtual visit you may have with one of our providers in the next 365 days. If you have a MyChart account, a copy of this consent can be sent to you electronically.  As this is a virtual visit, video technology does not allow for your provider to perform a traditional examination. This may limit your provider's ability to fully assess your condition. If your provider identifies any concerns that need to be evaluated in person or the need to arrange testing (such as labs, EKG, etc.), we will make arrangements to do so. Although advances in technology are sophisticated, we cannot ensure that it will always work on either your end or our end. If the connection with a video visit is poor, the visit may have to be switched to a telephone visit. With either a video or telephone visit, we are not always able to ensure that we have a secure connection.  By engaging in this virtual visit, you consent to the provision of healthcare and authorize for your insurance to be billed (if applicable) for the services provided during this visit. Depending on your insurance coverage, you may receive a charge related to this service.  I need to obtain your verbal consent now. Are you willing to proceed with your visit today? Carrie Ellis has provided verbal consent on 01/24/2022 for a virtual visit (video or telephone). Piedad Climes, [place]  Date: 01/24/2022 4:17 PM  Virtual Visit via Video Note   I, Piedad Climes, connected with  Carrie Ellis  ([id number], Sep 09, 1965) on 01/24/22 at  4:00 PM EST by a video-enabled telemedicine application and verified that I am speaking with the correct person using two identifiers.  Location: Patient:  Virtual Visit Location Patient: Home Provider: Virtual Visit Location Provider: Home Office   I discussed the limitations of evaluation and management by telemedicine and the availability of in person appointments. The patient expressed understanding and agreed to proceed.    History of Present Illness: Carrie Ellis is a 56 y.o. who identifies as a female who was assigned female at birth, and is being seen today for COVID-19. Notes recently seen and evaluated by her PCP on 12/6 for some ongoing URI symptoms. Was started on antibiotics for this. Notes during that time she tested for COVID x 3 and all negative. Over the weekend she started feeling worse again with increased nasal congestion, sore throat, fatigue, chills. Denies chest pain or SOB. Denies GI symptoms. Husband also with more severe symptoms starting and tested positive for COVID. She took two home tests today, one that was expired and inconclusive and a new Binax test that is positive (confirmed via visual inspection). Is unsure what next steps are.   Piedad Climes, PA-C     HPI: HPI  Problems:  Patient Active Problem List   Diagnosis Date Noted   Hyperlipidemia associated with type 2 diabetes mellitus (HCC) 10/31/2021   Thyrotoxicosis without thyroid storm 11/09/2016   Multinodular goiter 11/09/2016   Diabetes mellitus type 2 in obese (HCC) 04/14/2015   Essential hypertension 12/31/2008   ALLERGIC RHINITIS 12/31/2008    Allergies:  Allergies  Allergen Reactions   Penicillins     REACTION: rash   Ace Inhibitors Cough  Clarithromycin Nausea Only    REACTION: Extreme nausea   Diclofenac Nausea Only   Medications:  Current Outpatient Medications:    molnupiravir EUA (LAGEVRIO) 200 mg CAPS capsule, Take 4 capsules (800 mg total) by mouth 2 (two) times daily for 5 days., Disp: 40 capsule, Rfl: 0   amLODipine (NORVASC) 10 MG tablet, TAKE 1 TABLET(10 MG) BY MOUTH DAILY, Disp: 90 tablet, Rfl: 1    atorvastatin (LIPITOR) 20 MG tablet, Take 1 tablet (20 mg total) by mouth daily., Disp: 90 tablet, Rfl: 3   benzonatate (TESSALON) 200 MG capsule, Take 1 capsule (200 mg total) by mouth 3 (three) times daily as needed for cough., Disp: 40 capsule, Rfl: 1   doxycycline (VIBRA-TABS) 100 MG tablet, Take 1 tablet (100 mg total) by mouth 2 (two) times daily., Disp: 20 tablet, Rfl: 0   fluticasone (FLONASE) 50 MCG/ACT nasal spray, SHAKE LIQUID AND USE 1 SPRAY IN EACH NOSTRIL TWICE DAILY, Disp: 48 g, Rfl: 0   glipiZIDE (GLIPIZIDE XL) 10 MG 24 hr tablet, Take 2 tablets (20 mg total) by mouth daily with breakfast., Disp: 180 tablet, Rfl: 3   guaiFENesin-codeine (ROBITUSSIN AC) 100-10 MG/5ML syrup, Take 5 mLs by mouth 3 (three) times daily as needed for cough., Disp: 120 mL, Rfl: 0   ipratropium (ATROVENT) 0.03 % nasal spray, Spray two sprays in each nostril up to four times daily as needed, Disp: 30 mL, Rfl: 12   levocetirizine (XYZAL) 5 MG tablet, Take 1 tablet (5 mg total) by mouth daily., Disp: 30 tablet, Rfl: 0   metFORMIN (GLUCOPHAGE-XR) 500 MG 24 hr tablet, TAKE 4 TABLETS(2000 MG) BY MOUTH DAILY WITH BREAKFAST, Disp: 360 tablet, Rfl: 1   montelukast (SINGULAIR) 10 MG tablet, Take 1 tablet (10 mg total) by mouth at bedtime., Disp: 30 tablet, Rfl: 0   Multiple Vitamin (MULTIVITAMIN) tablet, Take 1 tablet by mouth daily., Disp: , Rfl:    ondansetron (ZOFRAN-ODT) 4 MG disintegrating tablet, DISSOLVE 1 TABLET(4 MG) ON THE TONGUE EVERY 6 HOURS AS NEEDED FOR NAUSEA, Disp: 30 tablet, Rfl: 1  Observations/Objective: Patient is well-developed, well-nourished in no acute distress.  Resting comfortably at home.  Head is normocephalic, atraumatic.  No labored breathing. Speech is clear and coherent with logical content.  Patient is alert and oriented at baseline.  Assessment and Plan: 1. COVID-19 - molnupiravir EUA (LAGEVRIO) 200 mg CAPS capsule; Take 4 capsules (800 mg total) by mouth 2 (two) times daily for  5 days.  Dispense: 40 capsule; Refill: 0 - MyChart COVID-19 home monitoring program; Future  Patient with multiple risk factors for complicated course of illness. Discussed risks/benefits of antiviral medications including most common potential ADRs. Patient voiced understanding and would like to proceed with antiviral medication. They are candidate for Molnupiravir. Rx sent to pharmacy. Supportive measures, OTC medications and vitamin regimen reviewed. Continue Doxycycline for recent sinusitis, given by PCP, along with cough syrup. Patient has been enrolled in a MyChart COVID symptom monitoring program. Anne Shutter reviewed in detail. Strict ER precautions discussed with patient.    Follow Up Instructions: I discussed the assessment and treatment plan with the patient. The patient was provided an opportunity to ask questions and all were answered. The patient agreed with the plan and demonstrated an understanding of the instructions.  A copy of instructions were sent to the patient via MyChart unless otherwise noted below.   The patient was advised to call back or seek an in-person evaluation if the symptoms worsen or if the condition  fails to improve as anticipated.  Time:  I spent 10 minutes with the patient via telehealth technology discussing the above problems/concerns.    Piedad Climes, PA-C

## 2022-04-29 NOTE — Progress Notes (Unsigned)
Hibba Schram T. Ellanor Feuerstein, MD, [place] at [facility] [place], [address]  Phone: [phone]  FAX: [phone]  Carrie Ellis - 57 y.o. female  MRN [id number]  Date of Birth: 08-02-1965  Date: 05/01/2022  PCP: Owens Loffler, MD  Referral: Owens Loffler, MD  No chief complaint on file.  Patient Care Team: Owens Loffler, MD as PCP - General Subjective:   Carrie Ellis is a 57 y.o. pleasant patient who presents with the following:  Health Maintenance Summary Reviewed and updated, unless pt declines services.  Tobacco History Reviewed. Non-smoker Alcohol: No concerns, no excessive use Exercise Habits: Some activity, rec at least 30 mins 5 times a week STD concerns: none Drug Use: None Lumps or breast concerns: no  Foot exam Cologuard Covid booster  Diabetes Mellitus: Tolerating Medications: yes Compliance with diet: fair, There is no height or weight on file to calculate BMI. Exercise: minimal / intermittent Avg blood sugars at home: not checking Foot problems: none Hypoglycemia: none No nausea, vomitting, blurred vision, polyuria.  Lab Results  Component Value Date   HGBA1C 7.9 (A) 10/31/2021   HGBA1C 7.5 (H) 04/20/2021   HGBA1C 9.4 (A) 12/20/2020   Lab Results  Component Value Date   MICROALBUR 20.0 (H) 04/20/2021   LDLCALC 92 04/20/2021   CREATININE 0.75 04/20/2021    Wt Readings from Last 3 Encounters:  01/18/22 253 lb 6 oz (114.9 kg)  12/07/21 254 lb 3.2 oz (115.3 kg)  10/31/21 252 lb 2 oz (114.4 kg)    HTN: Tolerating all medications without side effects Stable and at goal No CP, no sob. No HA.  BP Readings from Last 3 Encounters:  01/18/22 128/80  12/07/21 136/82  10/31/21 [id number]    Basic Metabolic Panel:    Component Value Date/Time   NA 139 04/20/2021 0806   K 4.2 04/20/2021 0806   CL 102 04/20/2021 0806   CO2 28 04/20/2021 0806   BUN 10 04/20/2021  0806   CREATININE 0.75 04/20/2021 0806   CREATININE 0.79 09/18/2016 1117   GLUCOSE 160 (H) 04/20/2021 0806   CALCIUM 9.4 04/20/2021 0806     Health Maintenance  Topic Date Due   FOOT EXAM  04/29/2019   COVID-19 Vaccine (5 - 2023-24 season) 10/14/2021   Fecal DNA (Cologuard)  11/10/2021   Diabetic kidney evaluation - eGFR measurement  04/21/2022   Diabetic kidney evaluation - Urine ACR  04/21/2022   OPHTHALMOLOGY EXAM  04/26/2022   HEMOGLOBIN A1C  05/01/2022   MAMMOGRAM  11/19/2023   PAP SMEAR-Modifier  04/22/2024   DTaP/Tdap/Td (3 - Td or Tdap) 03/22/2026   INFLUENZA VACCINE  Completed   Hepatitis C Screening  Completed   HIV Screening  Completed   Zoster Vaccines- Shingrix  Completed   HPV VACCINES  Aged Out    Immunization History  Administered Date(s) Administered   Influenza Inj Mdck Quad Pf 11/23/2020   Influenza Split 11/07/2011   Influenza,inj,Quad PF,6+ Mos 11/10/2013, 11/20/2014, 10/06/2015, 10/29/2017, 11/19/2018, 11/12/2019, 10/31/2021   PFIZER(Purple Top)SARS-COV-2 Vaccination 04/25/2019, 05/19/2019, 02/01/2020   Pfizer Covid-19 Vaccine Bivalent Booster 46yrs & up 11/23/2020   Pneumococcal Polysaccharide-23 10/06/2015   Td 02/14/2004   Tdap 03/22/2016   Zoster Recombinat (Shingrix) 04/27/2021, 10/31/2021   Patient Active Problem List   Diagnosis Date Noted   Diabetes mellitus type 2 in obese ([place]) 04/14/2015    Priority: High   Hyperlipidemia associated with type 2 diabetes mellitus ([place]) 10/31/2021  Priority: Medium    Essential hypertension 12/31/2008    Priority: Medium    Multinodular goiter 11/09/2016    Priority: Low   Thyrotoxicosis without thyroid storm 11/09/2016   ALLERGIC RHINITIS 12/31/2008    Past Medical History:  Diagnosis Date   Diabetes mellitus type 2 in obese ([place]) 04/14/2015   Hyperlipidemia associated with type 2 diabetes mellitus ([place]) 10/31/2021   Hypertension    Hypothyroid    Positive PPD, treated    9 months of treatment     Past Surgical History:  Procedure Laterality Date   CHOLECYSTECTOMY  1999   TONSILLECTOMY  1995    Family History  Problem Relation Age of Onset   Cancer Mother    Heart disease Mother    Stroke Other    Diabetes Other    Alcohol abuse Neg Hx    Mental illness Neg Hx    Breast cancer Neg Hx     Social History   Social History Narrative   Not on file    Past Medical History, Surgical History, Social History, Family History, Problem List, Medications, and Allergies have been reviewed and updated if relevant.  Review of Systems: Pertinent positives are listed above.  Otherwise, a full 14 point review of systems has been done in full and it is negative except where it is noted positive.  Objective:   LMP 09/24/2019 (Exact Date)  Ideal Body Weight:   No results found.    09/19/2021    1:26 PM 04/27/2021    9:29 AM 11/12/2019    2:53 PM 11/04/2018    8:24 AM 10/29/2017    9:14 AM  Depression screen PHQ 2/9  Decreased Interest 0 0 0 0 0  Down, Depressed, Hopeless 0 0 0 0 0  PHQ - 2 Score 0 0 0 0 0  Altered sleeping 0      Tired, decreased energy 0      Change in appetite 0      Feeling bad or failure about yourself  0      Trouble concentrating 0      Moving slowly or fidgety/restless 0      Suicidal thoughts 0      PHQ-9 Score 0      Difficult doing work/chores Not difficult at all         GEN: well developed, well nourished, no acute distress Eyes: conjunctiva and lids normal, PERRLA, EOMI ENT: TM clear, nares clear, oral exam WNL Neck: supple, no lymphadenopathy, no thyromegaly, no JVD Pulm: clear to auscultation and percussion, respiratory effort normal CV: regular rate and rhythm, S1-S2, no murmur, rub or gallop, no bruits Chest: no scars, masses, no lumps BREAST: breast exam declined GI: soft, non-tender; no hepatosplenomegaly, masses; active bowel sounds all quadrants GU: GU exam declined Lymph: no cervical, axillary or inguinal adenopathy MSK: gait  normal, muscle tone and strength WNL, no joint swelling, effusions, discoloration, crepitus  SKIN: clear, good turgor, color WNL, no rashes, lesions, or ulcerations Neuro: normal mental status, normal strength, sensation, and motion Psych: alert; oriented to person, place and time, normally interactive and not anxious or depressed in appearance.   All labs reviewed with patient. Results for orders placed or performed in visit on 12/07/21  TSH  Result Value Ref Range   TSH 1.28 0.35 - 5.50 uIU/mL  T3, free  Result Value Ref Range   T3, Free 3.4 2.3 - 4.2 pg/mL  T4, free  Result Value Ref Range  Free T4 1.14 0.60 - 1.60 ng/dL   No results found.  Assessment and Plan:     ICD-10-CM   1. Healthcare maintenance  Z00.00       Health Maintenance Exam: The patient's preventative maintenance and recommended screening tests for an annual wellness exam were reviewed in full today. Brought up to date unless services declined.  Counselled on the importance of diet, exercise, and its role in overall health and mortality. The patient's FH and SH was reviewed, including their home life, tobacco status, and drug and alcohol status.  Follow-up in 1 year for physical exam or additional follow-up below.  Disposition: No follow-ups on file.  Future Appointments  Date Time Provider [place]  05/01/2022  9:00 AM Owens Loffler, MD [facility]  12/08/2022  8:00 AM Philemon Kingdom, MD [facility] None    No orders of the defined types were placed in this encounter.  There are no discontinued medications. No orders of the defined types were placed in this encounter.   Signed,  Maud Deed. Annleigh Knueppel, MD   Allergies as of 05/01/2022       Reactions   Penicillins    REACTION: rash   Ace Inhibitors Cough   Clarithromycin Nausea Only   REACTION: Extreme nausea   Diclofenac Nausea Only        Medication List        Accurate as of April 29, 2022  9:34 AM. If you have  any questions, ask your nurse or doctor.          amLODipine 10 MG tablet Commonly known as: NORVASC TAKE 1 TABLET(10 MG) BY MOUTH DAILY   atorvastatin 20 MG tablet Commonly known as: LIPITOR Take 1 tablet (20 mg total) by mouth daily.   benzonatate 200 MG capsule Commonly known as: TESSALON Take 1 capsule (200 mg total) by mouth 3 (three) times daily as needed for cough.   doxycycline 100 MG tablet Commonly known as: VIBRA-TABS Take 1 tablet (100 mg total) by mouth 2 (two) times daily.   fluticasone 50 MCG/ACT nasal spray Commonly known as: FLONASE SHAKE LIQUID AND USE 1 SPRAY IN EACH NOSTRIL TWICE DAILY   glipiZIDE 10 MG 24 hr tablet Commonly known as: glipiZIDE XL Take 2 tablets (20 mg total) by mouth daily with breakfast.   guaiFENesin-codeine 100-10 MG/5ML syrup Commonly known as: ROBITUSSIN AC Take 5 mLs by mouth 3 (three) times daily as needed for cough.   ipratropium 0.03 % nasal spray Commonly known as: ATROVENT Spray two sprays in each nostril up to four times daily as needed   levocetirizine 5 MG tablet Commonly known as: XYZAL Take 1 tablet (5 mg total) by mouth daily.   metFORMIN 500 MG 24 hr tablet Commonly known as: GLUCOPHAGE-XR TAKE 4 TABLETS(2000 MG) BY MOUTH DAILY WITH BREAKFAST   montelukast 10 MG tablet Commonly known as: Singulair Take 1 tablet (10 mg total) by mouth at bedtime.   multivitamin tablet Take 1 tablet by mouth daily.   ondansetron 4 MG disintegrating tablet Commonly known as: ZOFRAN-ODT DISSOLVE 1 TABLET(4 MG) ON THE TONGUE EVERY 6 HOURS AS NEEDED FOR NAUSEA

## 2022-05-01 ENCOUNTER — Encounter: Payer: Self-pay | Admitting: Family Medicine

## 2022-05-01 ENCOUNTER — Ambulatory Visit (INDEPENDENT_AMBULATORY_CARE_PROVIDER_SITE_OTHER): Payer: 59 | Admitting: Family Medicine

## 2022-05-01 VITALS — BP 120/80 | HR 100 | Temp 97.8°F | Ht 70.75 in | Wt 244.5 lb

## 2022-05-01 DIAGNOSIS — Z79899 Other long term (current) drug therapy: Secondary | ICD-10-CM

## 2022-05-01 DIAGNOSIS — E0500 Thyrotoxicosis with diffuse goiter without thyrotoxic crisis or storm: Secondary | ICD-10-CM | POA: Diagnosis not present

## 2022-05-01 DIAGNOSIS — E1169 Type 2 diabetes mellitus with other specified complication: Secondary | ICD-10-CM | POA: Diagnosis not present

## 2022-05-01 DIAGNOSIS — E042 Nontoxic multinodular goiter: Secondary | ICD-10-CM

## 2022-05-01 DIAGNOSIS — Z0000 Encounter for general adult medical examination without abnormal findings: Secondary | ICD-10-CM | POA: Diagnosis not present

## 2022-05-01 DIAGNOSIS — E785 Hyperlipidemia, unspecified: Secondary | ICD-10-CM

## 2022-05-01 DIAGNOSIS — E669 Obesity, unspecified: Secondary | ICD-10-CM

## 2022-05-01 DIAGNOSIS — Z1211 Encounter for screening for malignant neoplasm of colon: Secondary | ICD-10-CM

## 2022-05-01 LAB — MICROALBUMIN / CREATININE URINE RATIO
Creatinine,U: 135.4 mg/dL
Microalb Creat Ratio: 4.7 mg/g (ref 0.0–30.0)
Microalb, Ur: 6.3 mg/dL — ABNORMAL HIGH (ref 0.0–1.9)

## 2022-05-01 LAB — CBC WITH DIFFERENTIAL/PLATELET
Basophils Absolute: 0 10*3/uL (ref 0.0–0.1)
Basophils Relative: 0.6 % (ref 0.0–3.0)
Eosinophils Absolute: 0.1 10*3/uL (ref 0.0–0.7)
Eosinophils Relative: 2.4 % (ref 0.0–5.0)
HCT: 43.7 % (ref 36.0–46.0)
Hemoglobin: 14.2 g/dL (ref 12.0–15.0)
Lymphocytes Relative: 43.3 % (ref 12.0–46.0)
Lymphs Abs: 2 10*3/uL (ref 0.7–4.0)
MCHC: 32.5 g/dL (ref 30.0–36.0)
MCV: 83.2 fl (ref 78.0–100.0)
Monocytes Absolute: 0.2 10*3/uL (ref 0.1–1.0)
Monocytes Relative: 5.2 % (ref 3.0–12.0)
Neutro Abs: 2.3 10*3/uL (ref 1.4–7.7)
Neutrophils Relative %: 48.5 % (ref 43.0–77.0)
Platelets: 272 10*3/uL (ref 150.0–400.0)
RBC: 5.26 Mil/uL — ABNORMAL HIGH (ref 3.87–5.11)
RDW: 13.7 % (ref 11.5–15.5)
WBC: 4.7 10*3/uL (ref 4.0–10.5)

## 2022-05-01 LAB — HEPATIC FUNCTION PANEL
ALT: 12 U/L (ref 0–35)
AST: 13 U/L (ref 0–37)
Albumin: 4.2 g/dL (ref 3.5–5.2)
Alkaline Phosphatase: 81 U/L (ref 39–117)
Bilirubin, Direct: 0.1 mg/dL (ref 0.0–0.3)
Total Bilirubin: 0.5 mg/dL (ref 0.2–1.2)
Total Protein: 7.7 g/dL (ref 6.0–8.3)

## 2022-05-01 LAB — BASIC METABOLIC PANEL
BUN: 9 mg/dL (ref 6–23)
CO2: 25 mEq/L (ref 19–32)
Calcium: 9.5 mg/dL (ref 8.4–10.5)
Chloride: 103 mEq/L (ref 96–112)
Creatinine, Ser: 0.81 mg/dL (ref 0.40–1.20)
GFR: 81.01 mL/min (ref >60.00)
Glucose, Bld: 197 mg/dL — ABNORMAL HIGH (ref 70–99)
Potassium: 3.8 mEq/L (ref 3.5–5.1)
Sodium: 138 mEq/L (ref 135–145)

## 2022-05-01 LAB — HEMOGLOBIN A1C: Hgb A1c MFr Bld: 9.4 % — ABNORMAL HIGH (ref 4.6–6.5)

## 2022-05-01 LAB — T3, FREE: T3, Free: 2.6 pg/mL (ref 2.3–4.2)

## 2022-05-01 LAB — T4, FREE: Free T4: 1.18 ng/dL (ref 0.60–1.60)

## 2022-05-01 LAB — TSH: TSH: 1.44 u[IU]/mL (ref 0.35–5.50)

## 2022-05-01 MED ORDER — SCOPOLAMINE 1 MG/3DAYS TD PT72: 1.0000 | MEDICATED_PATCH | TRANSDERMAL | 2 refills | Status: DC

## 2022-05-01 NOTE — Patient Instructions (Addendum)
For allergies:   Get Xyzal (generic) oral  Pataday once a day for eye allergies (eye drops)  You will get a Cologuard kit for colon cancer screening  Covid-19 booster reminder

## 2022-05-02 ENCOUNTER — Encounter: Payer: Self-pay | Admitting: Family Medicine

## 2022-05-03 MED ORDER — OZEMPIC (0.25 OR 0.5 MG/DOSE) 2 MG/3ML ~~LOC~~ SOPN: PEN_INJECTOR | SUBCUTANEOUS | 1 refills | Status: DC

## 2022-05-03 MED ORDER — SEMAGLUTIDE (1 MG/DOSE) 4 MG/3ML ~~LOC~~ SOPN: 1.0000 mg | PEN_INJECTOR | SUBCUTANEOUS | 5 refills | Status: DC

## 2022-05-13 LAB — COLOGUARD: COLOGUARD: NEGATIVE

## 2022-05-30 ENCOUNTER — Telehealth: Payer: Self-pay | Admitting: Family Medicine

## 2022-05-30 NOTE — Telephone Encounter (Signed)
Patient contacted the office regarding medication ozempic prescribed by her pcp. States she is struggling to use the medication, has some questions about how to give herself the injection. Suggested that pharmacist may be able to help regarding this, was wondering if you could reach out to her and possibly answer some questions she has? Please advise [phone].

## 2022-05-30 NOTE — Telephone Encounter (Signed)
Spoke with patient, she will come into office to meet with me Monday @ 1:00pm for Ozempic training.

## 2022-06-02 ENCOUNTER — Other Ambulatory Visit: Payer: Self-pay | Admitting: Family Medicine

## 2022-06-13 LAB — HM DIABETES EYE EXAM

## 2022-06-15 ENCOUNTER — Other Ambulatory Visit: Payer: Self-pay | Admitting: Family Medicine

## 2022-06-15 DIAGNOSIS — R110 Nausea: Secondary | ICD-10-CM

## 2022-07-06 ENCOUNTER — Other Ambulatory Visit: Payer: Self-pay | Admitting: Family Medicine

## 2022-07-25 ENCOUNTER — Encounter: Payer: Self-pay | Admitting: Family Medicine

## 2022-08-01 NOTE — Progress Notes (Unsigned)
    Olney Monier T. Kallyn Demarcus, MD, CAQ Sports Medicine [facility] at [facility] [address] [place], [address]  Phone: [phone]  FAX: [phone]  Carrie Ellis - 57 y.o. female  MRN [id number]  Date of Birth: [birth year]  Date: 08/02/2022  PCP: Hannah Beat, MD  Referral: Hannah Beat, MD  No chief complaint on file.  Subjective:   Carrie Ellis is a 57 y.o. very pleasant female patient with There is no height or weight on file to calculate BMI. who presents with the following:  She is here to follow-up on diabetes, hypertension, hyperlipidemia.  Last time I saw her her A1c was greater than 9.  She is currently on metformin 2000 mg a day.  We started her on Ozempic and titrated up the dose over the last few months, and she should now be on 1 mg.  Diabetes Mellitus: Tolerating Medications: yes Compliance with diet: fair, There is no height or weight on file to calculate BMI. Exercise: minimal / intermittent Avg blood sugars at home: not checking Foot problems: none Hypoglycemia: none No nausea, vomitting, blurred vision, polyuria.  Lab Results  Component Value Date   HGBA1C 9.4 (H) 05/01/2022   HGBA1C 7.9 (A) 10/31/2021   HGBA1C 7.5 (H) 04/20/2021   Lab Results  Component Value Date   MICROALBUR 6.3 (H) 05/01/2022   LDLCALC 92 04/20/2021   CREATININE 0.81 05/01/2022    Wt Readings from Last 3 Encounters:  05/01/22 244 lb 8 oz (110.9 kg)  01/18/22 253 lb 6 oz (114.9 kg)  12/07/21 254 lb 3.2 oz (115.3 kg)    HTN: Tolerating all medications without side effects Stable and at goal No CP, no sob. No HA.  BP Readings from Last 3 Encounters:  05/01/22 120/80  01/18/22 128/80  12/07/21 136/82    Basic Metabolic Panel:    Component Value Date/Time   NA 138 05/01/2022 0936   K 3.8 05/01/2022 0936   CL 103 05/01/2022 0936   CO2 25 05/01/2022 0936   BUN 9 05/01/2022 0936   CREATININE 0.81 05/01/2022 0936    CREATININE 0.79 09/18/2016 1117   GLUCOSE 197 (H) 05/01/2022 0936   CALCIUM 9.5 05/01/2022 0936    Lipids: Doing well, stable. Tolerating meds fine with no SE. Panel reviewed with patient.  Lipids: Lab Results  Component Value Date   CHOL 171 04/20/2021   Lab Results  Component Value Date   HDL 62.30 04/20/2021   Lab Results  Component Value Date   LDLCALC 92 04/20/2021   Lab Results  Component Value Date   TRIG 85.0 04/20/2021   Lab Results  Component Value Date   CHOLHDL 3 04/20/2021    Lab Results  Component Value Date   ALT 12 05/01/2022   AST 13 05/01/2022   ALKPHOS 81 05/01/2022   BILITOT 0.5 05/01/2022     Review of Systems is noted in the HPI, as appropriate  Objective:   LMP 09/24/2019 (Exact Date)   GEN: No acute distress; alert,appropriate. PULM: Breathing comfortably in no respiratory distress PSYCH: Normally interactive.   Laboratory and Imaging Data:  Assessment and Plan:   ***

## 2022-08-02 ENCOUNTER — Encounter: Payer: Self-pay | Admitting: Family Medicine

## 2022-08-02 ENCOUNTER — Ambulatory Visit (INDEPENDENT_AMBULATORY_CARE_PROVIDER_SITE_OTHER): Payer: 59 | Admitting: Family Medicine

## 2022-08-02 VITALS — BP 110/70 | HR 99 | Temp 97.3°F | Ht 70.75 in | Wt 240.1 lb

## 2022-08-02 DIAGNOSIS — Z7984 Long term (current) use of oral hypoglycemic drugs: Secondary | ICD-10-CM

## 2022-08-02 DIAGNOSIS — I10 Essential (primary) hypertension: Secondary | ICD-10-CM

## 2022-08-02 DIAGNOSIS — E119 Type 2 diabetes mellitus without complications: Secondary | ICD-10-CM

## 2022-08-02 DIAGNOSIS — E785 Hyperlipidemia, unspecified: Secondary | ICD-10-CM | POA: Diagnosis not present

## 2022-08-02 DIAGNOSIS — E1169 Type 2 diabetes mellitus with other specified complication: Secondary | ICD-10-CM

## 2022-08-02 LAB — LIPID PANEL
Cholesterol: 158 mg/dL (ref 0–200)
HDL: 51.7 mg/dL (ref >39.00)
LDL Cholesterol: 91 mg/dL (ref 0–99)
NonHDL: 105.92
Total CHOL/HDL Ratio: 3
Triglycerides: 73 mg/dL (ref 0.0–149.0)
VLDL: 14.6 mg/dL (ref 0.0–40.0)

## 2022-08-02 LAB — POCT GLYCOSYLATED HEMOGLOBIN (HGB A1C): Hemoglobin A1C: 7 % — AB (ref 4.0–5.6)

## 2022-08-02 MED ORDER — METFORMIN HCL ER 500 MG PO TB24: 1500.0000 mg | ORAL_TABLET | Freq: Every day | ORAL | 0 refills | Status: DC

## 2022-08-02 MED ORDER — OZEMPIC (0.25 OR 0.5 MG/DOSE) 2 MG/3ML ~~LOC~~ SOPN: 0.2500 mg | PEN_INJECTOR | SUBCUTANEOUS | 5 refills | Status: DC

## 2022-08-02 NOTE — Patient Instructions (Signed)
Metformin 500 mg - take 3 tablets each morning  Glipizide - 2 tablets each morning  Ozempic - Lower the dose to 0.25 mg each week

## 2022-08-10 ENCOUNTER — Encounter: Payer: Self-pay | Admitting: Family Medicine

## 2022-08-11 NOTE — Telephone Encounter (Signed)
Form dropped off and placed in [place] box in front. Thank you!

## 2022-08-11 NOTE — Telephone Encounter (Signed)
Wellness form completed and placed in Dr. Cyndie Chime office in box for signature.

## 2022-09-29 ENCOUNTER — Other Ambulatory Visit: Payer: Self-pay | Admitting: Family Medicine

## 2022-09-29 DIAGNOSIS — R110 Nausea: Secondary | ICD-10-CM

## 2022-09-29 NOTE — Telephone Encounter (Signed)
Last office visit 08/02/2022 for DM/HTN/CHOL.  Last refilled 06/15/22 for #30 with no refills.  Next appt: 01/31/23 for DM.

## 2022-10-13 ENCOUNTER — Telehealth: Payer: Self-pay | Admitting: Family Medicine

## 2022-10-13 ENCOUNTER — Encounter: Payer: Self-pay | Admitting: Family Medicine

## 2022-10-13 ENCOUNTER — Ambulatory Visit (INDEPENDENT_AMBULATORY_CARE_PROVIDER_SITE_OTHER): Payer: 59 | Admitting: Family Medicine

## 2022-10-13 VITALS — BP 126/84 | HR 74 | Temp 99.0°F | Ht 70.75 in | Wt 241.0 lb

## 2022-10-13 DIAGNOSIS — H6090 Unspecified otitis externa, unspecified ear: Secondary | ICD-10-CM | POA: Insufficient documentation

## 2022-10-13 DIAGNOSIS — H60392 Other infective otitis externa, left ear: Secondary | ICD-10-CM | POA: Diagnosis not present

## 2022-10-13 DIAGNOSIS — B002 Herpesviral gingivostomatitis and pharyngotonsillitis: Secondary | ICD-10-CM | POA: Insufficient documentation

## 2022-10-13 MED ORDER — VALACYCLOVIR HCL 1 G PO TABS
2000.0000 mg | ORAL_TABLET | Freq: Two times a day (BID) | ORAL | 1 refills | Status: DC
Start: 2022-10-13 — End: 2023-01-30

## 2022-10-13 MED ORDER — NEOMYCIN-POLYMYXIN-HC 3.5-10000-1 OT SOLN: 4.0000 [drp] | Freq: Four times a day (QID) | OTIC | 0 refills | Status: DC

## 2022-10-13 MED ORDER — CIPROFLOXACIN-HYDROCORTISONE 0.2-1 % OT SUSP: 3.0000 [drp] | Freq: Two times a day (BID) | OTIC | 0 refills | Status: DC

## 2022-10-13 NOTE — Telephone Encounter (Signed)
Patient contacted the office regarding medication ciprofloxacin-hydrocortisone (CIPRO HC OTIC) OTIC suspension, states her preferred pharmacy is out of this and she would like to know if there is an alternative that [facility] could send in for her?

## 2022-10-13 NOTE — Assessment & Plan Note (Signed)
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  Valacyclovir 2 g twice daily x 1 day.

## 2022-10-13 NOTE — Telephone Encounter (Signed)
Carrie Ellis notified by telephone that a replacement Rx has been sent to her pharmacy.

## 2022-10-13 NOTE — Telephone Encounter (Signed)
Replacement prescription sent.

## 2022-10-13 NOTE — Progress Notes (Signed)
Patient ID: Carrie Ellis, female    DOB: 16-May-1965, 57 y.o.   MRN: [id number]  This visit was conducted in person.  BP 126/84 (BP Location: Right Arm, Patient Position: Sitting, Cuff Size: Normal)   Pulse 74   Temp 99 F (37.2 C) (Oral)   Ht 5' 10.75" (1.797 m)   Wt 241 lb (109.3 kg)   LMP 09/24/2019 (Exact Date)   SpO2 99%   BMI 33.85 kg/m    CC:  Chief Complaint  Patient presents with   Ear pain    L ear, "low throbbing pain" for the last 2-3 days. Pain has steadily gotten worse.    Subjective:   HPI: Carrie Ellis is a 57 y.o. female presenting on 10/13/2022 for Ear pain (L ear, "low throbbing pain" for the last 2-3 days. Pain has steadily gotten worse.)   She has noted new onset pain in last week, left ear pain.Marland Kitchen throbbing gradually worsening.  Has noted decreased hearing in left ear.  No congestion, ST, no fever, no SOB, no wheeze.   Has treated with tylenol.   Has history of seasonal allergies... not currently taking any allergies.      Relevant past medical, surgical, family and social history reviewed and updated as indicated. Interim medical history since our last visit reviewed. Allergies and medications reviewed and updated. Outpatient Medications Prior to Visit  Medication Sig Dispense Refill   amLODipine (NORVASC) 10 MG tablet TAKE 1 TABLET(10 MG) BY MOUTH DAILY 90 tablet 1   atorvastatin (LIPITOR) 20 MG tablet Take 1 tablet (20 mg total) by mouth daily. 90 tablet 3   glipiZIDE (GLIPIZIDE XL) 10 MG 24 hr tablet Take 2 tablets (20 mg total) by mouth daily with breakfast. 180 tablet 3   metFORMIN (GLUCOPHAGE-XR) 500 MG 24 hr tablet Take 3 tablets (1,500 mg total) by mouth daily with breakfast. 1 tablet 0   Multiple Vitamin (MULTIVITAMIN) tablet Take 1 tablet by mouth daily.     ondansetron (ZOFRAN-ODT) 4 MG disintegrating tablet DISSOLVE 1 TABLET(4 MG) ON THE TONGUE EVERY 6 HOURS AS NEEDED FOR NAUSEA 30 tablet 1   Semaglutide,0.25 or  0.5MG /DOS, (OZEMPIC, 0.25 OR 0.5 MG/DOSE,) 2 MG/3ML SOPN Inject 0.25 mg into the skin once a week. Inject 0.25 mg once a week for 4 weeks, then inject 0.5 mg once a week for 4 weeks 3 mL 5   No facility-administered medications prior to visit.     Per HPI unless specifically indicated in ROS section below Review of Systems  Constitutional:  Negative for fatigue and fever.  HENT:  Positive for ear pain. Negative for congestion.   Eyes:  Negative for pain.  Respiratory:  Negative for cough and shortness of breath.   Cardiovascular:  Negative for chest pain, palpitations and leg swelling.  Gastrointestinal:  Negative for abdominal pain.  Genitourinary:  Negative for dysuria and vaginal bleeding.  Musculoskeletal:  Negative for back pain.  Neurological:  Negative for syncope, light-headedness and headaches.  Psychiatric/Behavioral:  Negative for dysphoric mood.    Objective:  BP 126/84 (BP Location: Right Arm, Patient Position: Sitting, Cuff Size: Normal)   Pulse 74   Temp 99 F (37.2 C) (Oral)   Ht 5' 10.75" (1.797 m)   Wt 241 lb (109.3 kg)   LMP 09/24/2019 (Exact Date)   SpO2 99%   BMI 33.85 kg/m   Wt Readings from Last 3 Encounters:  10/13/22 241 lb (109.3 kg)  08/02/22 240 lb 2 oz (108.9  kg)  05/01/22 244 lb 8 oz (110.9 kg)      Physical Exam Constitutional:      General: She is not in acute distress.    Appearance: Normal appearance. She is well-developed. She is not ill-appearing or toxic-appearing.  HENT:     Head: Normocephalic.     Right Ear: Hearing, tympanic membrane and ear canal normal. No swelling or tenderness. Tympanic membrane is not erythematous, retracted or bulging.     Left Ear: Hearing, tympanic membrane, ear canal and external ear normal. Swelling and tenderness present.  No middle ear effusion. There is no impacted cerumen. Tympanic membrane is not erythematous, retracted or bulging.     Nose: No mucosal edema or rhinorrhea.     Right Sinus: No  maxillary sinus tenderness or frontal sinus tenderness.     Left Sinus: No maxillary sinus tenderness or frontal sinus tenderness.     Mouth/Throat:     Mouth: Oropharynx is clear and moist and mucous membranes are normal.     Pharynx: Uvula midline.  Eyes:     General: Lids are normal. Lids are everted, no foreign bodies appreciated.     Extraocular Movements: EOM normal.     Conjunctiva/sclera: Conjunctivae normal.     Pupils: Pupils are equal, round, and reactive to light.  Neck:     Thyroid: No thyroid mass or thyromegaly.     Vascular: No carotid bruit.     Trachea: Trachea normal.  Cardiovascular:     Rate and Rhythm: Normal rate and regular rhythm.     Pulses: Normal pulses.     Heart sounds: Normal heart sounds, S1 normal and S2 normal. No murmur heard.    No friction rub. No gallop.  Pulmonary:     Effort: Pulmonary effort is normal. No tachypnea or respiratory distress.     Breath sounds: Normal breath sounds. No decreased breath sounds, wheezing, rhonchi or rales.  Abdominal:     General: Bowel sounds are normal.     Palpations: Abdomen is soft.     Tenderness: There is no abdominal tenderness.  Musculoskeletal:     Cervical back: Normal range of motion and neck supple.  Skin:    General: Skin is warm, dry and intact.     Findings: No rash.  Neurological:     Mental Status: She is alert.  Psychiatric:        Mood and Affect: Mood is not anxious or depressed.        Speech: Speech normal.        Behavior: Behavior normal. Behavior is cooperative.        Thought Content: Thought content normal.        Cognition and Memory: Cognition and memory normal.        Judgment: Judgment normal.       Results for orders placed or performed in visit on 08/02/22  Lipid panel  Result Value Ref Range   Cholesterol 158 0 - 200 mg/dL   Triglycerides 16.1 0.0 - 149.0 mg/dL   HDL 09.60 >45.40 mg/dL   VLDL 98.1 0.0 - 19.1 mg/dL   LDL Cholesterol 91 0 - 99 mg/dL   Total  CHOL/HDL Ratio 3    NonHDL 105.92   POCT glycosylated hemoglobin (Hb A1C)  Result Value Ref Range   Hemoglobin A1C 7.0 (A) 4.0 - 5.6 %   HbA1c POC (<> result, manual entry)     HbA1c, POC (prediabetic range)     HbA1c,  POC (controlled diabetic range)      Assessment and Plan  Other infective acute otitis externa of left ear Assessment & Plan: Acute, no evidence of middle ear infection but swelling, redness and pain in external ear canal.  Treat with topical antibiotic steroid drops.  Left ear.  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  Valacyclovir 2 g twice daily x 1 day.   Other orders -     valACYclovir HCl; Take 2 tablets (2,000 mg total) by mouth 2 (two) times daily. For 1 day at symptom onset  Dispense: 4 tablet; Refill: 1 -     Ciprofloxacin-Hydrocortisone; Place 3 drops into the left ear 2 (two) times daily for 5 days.  Dispense: 10 mL; Refill: 0    No follow-ups on file.   Kerby Nora, MD

## 2022-10-13 NOTE — Telephone Encounter (Addendum)
Rx resent to [facility] in [place]. Patient notified of this via telephone.

## 2022-10-13 NOTE — Assessment & Plan Note (Signed)
Acute, no evidence of middle ear infection but swelling, redness and pain in external ear canal.  Treat with topical antibiotic steroid drops.  Left ear.  Return and ER precautions provided

## 2022-10-13 NOTE — Telephone Encounter (Signed)
Patient called in stating that pharmacy doesn't have medication neomycin-polymyxin-hydrocortisone (CORTISPORIN) OTIC solution in stock and would like to know if it could be sent to  [id number] [place] - [address] Phone: [phone]  Fax: [phone]

## 2022-10-18 ENCOUNTER — Ambulatory Visit: Payer: 59 | Admitting: Family Medicine

## 2022-10-20 ENCOUNTER — Encounter: Payer: Self-pay | Admitting: Family Medicine

## 2022-10-20 ENCOUNTER — Ambulatory Visit (INDEPENDENT_AMBULATORY_CARE_PROVIDER_SITE_OTHER): Payer: 59 | Admitting: Family Medicine

## 2022-10-20 VITALS — BP 125/82 | HR 86 | Ht 72.0 in | Wt 240.0 lb

## 2022-10-20 DIAGNOSIS — Z01419 Encounter for gynecological examination (general) (routine) without abnormal findings: Secondary | ICD-10-CM | POA: Diagnosis not present

## 2022-10-20 DIAGNOSIS — Z23 Encounter for immunization: Secondary | ICD-10-CM | POA: Diagnosis not present

## 2022-10-20 NOTE — Progress Notes (Signed)
Patient presents for Annual.   LMP: Post Menopausal  Last pap:  04/23/2019 Mammogram:  11/18/2021 STD Screening: Declines Flu Vaccine :  pt wants to discuss has ear infection today.  CC: Annual/None  Fun Fact: Dr.Offices makes pt nervous.

## 2022-10-20 NOTE — Addendum Note (Signed)
Addended by: Scheryl Marten on: 10/20/2022 09:06 AM   Modules accepted: Orders

## 2022-10-20 NOTE — Progress Notes (Signed)
   GYNECOLOGY ANNUAL PREVENTATIVE CARE ENCOUNTER NOTE  Subjective:   Carrie Ellis is a 57 y.o. G28P2002 female here for a routine annual gynecologic exam.  Current complaints: None.     Patient is doing well. Sees her PCP regularly and has blood work in June. She reports no vaginal bleeding.  Has lost weight- 12 lbs since last year and decreased her HA1C!  Denies abnormal vaginal bleeding, discharge, pelvic pain, problems with intercourse or other gynecologic concerns.    Gynecologic History Patient's last menstrual period was 09/24/2019 (exact date). Contraception: post menopausal status Last Pap: NIL, HPV negative in 2021.  Last mammogram: 2023. Results were: normal  Health Maintenance Due  Topic Date Due   INFLUENZA VACCINE  09/14/2022   COVID-19 Vaccine (5 - 2023-24 season) 10/15/2022    The following portions of the patient's history were reviewed and updated as appropriate: allergies, current medications, past family history, past medical history, past social history, past surgical history and problem list.  Review of Systems Pertinent items are noted in HPI.   Objective:  BP (!) 143/89   Pulse 97   Ht 6' (1.829 m)   Wt 240 lb (108.9 kg)   LMP 09/24/2019 (Exact Date)   BMI 32.55 kg/m   CONSTITUTIONAL: Well-developed, well-nourished female in no acute distress.  HENT:  Normocephalic, atraumatic, External right and left ear normal. Oropharynx is clear and moist EYES:  No scleral icterus.  NECK: Normal range of motion, supple, no masses.  Normal thyroid.  SKIN: Skin is warm and dry. No rash noted. Not diaphoretic. No erythema. No pallor. NEUROLOGIC: Alert and oriented to person, place, and time. Normal reflexes, muscle tone coordination. No cranial nerve deficit noted. PSYCHIATRIC: Normal mood and affect. Normal behavior. Normal judgment and thought content. CARDIOVASCULAR: Normal heart rate noted, regular rhythm. 2+ distal pulses. RESPIRATORY: Effort and  breath sounds normal, no problems with respiration noted. BREASTS: Symmetric in size. No masses, skin changes, nipple drainage, or lymphadenopathy. ABDOMEN: Soft,  no distention noted.  No tenderness, rebound or guarding.  PELVIC: Normal appearing external genitalia; Mildly atrophic vaginal mucosa and cervix. No abnormal discharge noted.  Pap smear- not indicated.  Normal uterine size, no other palpable masses, no uterine or adnexal tenderness. Chaperone present for exam MUSCULOSKELETAL: Normal range of motion.   Assessment and Plan:  1) Annual gynecologic examination without pap smear:  Will follow up results of pap smear and manage accordingly. STI screening desired No.  Routine preventative health maintenance measures emphasized. Reviewed perimenopausal symptoms and management.   1. Well woman exam with routine gynecological exam CRC screening done by PCP Blood work performed by PCP Desires flu shot today- given Ordering Mammogram Pap is UTD - MM 3D SCREENING MAMMOGRAM BILATERAL BREAST; Future   Please refer to After Visit Summary for other counseling recommendations.   Return in about 1 year (around 10/20/2023) for Yearly wellness exam.  Federico Flake, MD, MPH, ABFM Attending Physician Center for [facility]

## 2022-11-01 ENCOUNTER — Other Ambulatory Visit: Payer: Self-pay | Admitting: Family Medicine

## 2022-12-06 ENCOUNTER — Telehealth: Payer: Self-pay

## 2022-12-06 ENCOUNTER — Other Ambulatory Visit (HOSPITAL_COMMUNITY): Payer: Self-pay

## 2022-12-06 NOTE — Telephone Encounter (Signed)
Pharmacy Patient Advocate Encounter   Received notification from Physician's Office that prior authorization for Ozempic is required/requested.   Insurance verification completed.   The patient is insured through [facility] .   Per test claim: PA required; PA submitted to [facility] via CoverMyMeds Key/confirmation #/EOC Key: [id number] Status is pending

## 2022-12-07 ENCOUNTER — Other Ambulatory Visit (HOSPITAL_COMMUNITY): Payer: Self-pay

## 2022-12-07 ENCOUNTER — Encounter: Payer: Self-pay | Admitting: *Deleted

## 2022-12-07 NOTE — Telephone Encounter (Signed)
Pharmacy Patient Advocate Encounter  Received notification from [facility] that Prior Authorization for Ozempic (0.25 or 0.5 MG/DOSE) 2MG /3ML pen-injectors has been APPROVED from 12/06/2022 to 12/06/2023

## 2022-12-08 ENCOUNTER — Ambulatory Visit (INDEPENDENT_AMBULATORY_CARE_PROVIDER_SITE_OTHER): Payer: 59 | Admitting: Internal Medicine

## 2022-12-08 ENCOUNTER — Encounter: Payer: Self-pay | Admitting: Internal Medicine

## 2022-12-08 VITALS — BP 130/80 | HR 101 | Ht 72.0 in | Wt 240.4 lb

## 2022-12-08 DIAGNOSIS — E042 Nontoxic multinodular goiter: Secondary | ICD-10-CM

## 2022-12-08 DIAGNOSIS — E0590 Thyrotoxicosis, unspecified without thyrotoxic crisis or storm: Secondary | ICD-10-CM

## 2022-12-08 LAB — T4, FREE: Free T4: 1.15 ng/dL (ref 0.60–1.60)

## 2022-12-08 LAB — T3, FREE: T3, Free: 3.4 pg/mL (ref 2.3–4.2)

## 2022-12-08 LAB — TSH: TSH: 1.57 u[IU]/mL (ref 0.35–5.50)

## 2022-12-08 NOTE — Patient Instructions (Signed)
Please stop at the lab.  Please come back for a follow-up appointment in 1 year.  

## 2022-12-08 NOTE — Progress Notes (Signed)
Patient ID: Carrie Ellis, female   DOB: [birth year], 57 y.o.   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  Last visit 1 year ago.  Interim hx: She is feeling well at this visit, without complaints except allergy sxs.: congestion, hoarseness. No cough.  No tremors, palpitations, heat intolerance, unintentional weight loss. She did lose 14 lbs since last visit - exercises at the gym, walking, reduced stress eating. Also, no cold intolerance, weight gain, constipation, hair loss.  Reviewed history: Pt has had a low TSH since 1990s >> saw Dr. Margaretmary Ellis >> was on medications >> then TFTs normalized >> stopped the medication (cannot remember name). After this, she started to see Dr. Dallas Ellis and her TFTs started to become abnormal again in 2017. She ended up having RAI tx in 2020 (see below).  Reviewed her TFTs: Lab Results  Component Value Date   TSH 1.44 05/01/2022   TSH 1.28 12/07/2021   TSH 1.92 04/20/2021   TSH 1.59 12/07/2020   TSH 1.20 12/04/2019   TSH 1.14 06/04/2019   TSH 1.44 03/31/2019   TSH 0.81 01/31/2019   TSH <0.01 (L) 12/12/2018   TSH <0.01 (L) 08/08/2018   FREET4 1.18 05/01/2022   FREET4 1.14 12/07/2021   FREET4 1.04 04/20/2021   FREET4 1.15 12/07/2020   FREET4 1.09 12/04/2019   FREET4 0.99 06/04/2019   FREET4 0.95 03/31/2019   FREET4 0.91 01/31/2019   FREET4 1.25 12/12/2018   FREET4 1.02 08/08/2018   Lab Results  Component Value Date   T3FREE 2.6 05/01/2022   T3FREE 3.4 12/07/2021   T3FREE 3.6 04/20/2021   T3FREE 3.0 12/07/2020   T3FREE 3.4 12/04/2019   T3FREE 3.3 06/04/2019   T3FREE 3.1 03/31/2019   T3FREE 3.0 01/31/2019   T3FREE 3.3 12/12/2018   T3FREE 3.8 08/08/2018   2011: TSH 0.29, normal free hormones  Her TSI antibodies were not elevated: Lab Results  Component Value Date   TSI <89 11/09/2016    Reviewed the rest of the investigation and management: Thyroid ultrasound (04/20/2006 compared with 05/13/2004) showed multinodular goiter with slight interval increase in size of the right inferior thyroid nodule and left inferior thyroid nodule, but otherwise stable size of the nodules, with slightly enlarged thyroid.  Thyroid uptake and scan (11/28/2016): She had toxic bilateral thyroid adenomas. Hyper functional BILATERAL thyroid nodules with suppression of uptake in remaining thyroid tissue compatible with hyper functional thyroid adenomas. 24 hour radio iodine uptake of 26%.  She had a hard time deciding whether to have the RAI treatment or not and she finally decided for this in 2020:  Thyroid uptake (09/24/2018): elevated: 21% (5 to 15%), 37.2% at 24 hours (10 to 30%).  RAI treatment (11/04/2018): 31.2 mCi  Thyroid ultrasound (12/17/2019): Parenchymal Echotexture: Moderately heterogenous Isthmus: 0.9 cm Right lobe: 5.6 cm x 2.0 cm x 2.1 cm Left lobe: 6.1 cm x 1.4 cm x 2.2 cm __________________________________________________________________   Nodule # 1: Location: Right; Inferior Maximum size: 3.0 cm; Other 2 dimensions: 1.8 cm x 1.5 cm. This nodule has decreased in size from the prior of 3.9 cm in 2008  Composition: mixed cystic and solid (1) Echogenicity: hypoechoic (2) ACR TI-RADS recommendations: Nodule meets criteria for biopsy  ________________________________________________________   Nodule # 2: Location: Isthmus; mid Maximum size: 1.1 cm; Other 2 dimensions: 0.9 cm x 0.8 cm Composition: solid/almost completely solid (2)  Echogenicity: hyperechoic (1) Shape: taller-than-wide (3) ACR TI-RADS recommendations: Nodule meets criteria for surveillance _________________________________________________________   Nodule # 3:  Location: Isthmus; mid Maximum size: 1.4 cm; Other 2 dimensions: 1.2 cm x 0.8 cm Composition: cannot determine (2)  Echogenicity: hypoechoic  (2) ACR TI-RADS recommendations: Nodule meets criteria for surveillance _________________________________________________________   Nodule # 4:  Location: Left; Superior  Maximum size: 0.8 cm; Other 2 dimensions: 0.7 cm x 0.4 cm Composition: cannot determine (2) Echogenicity: isoechoic (1) ACR TI-RADS recommendations: Nodule does not meet criteria for surveillance or biopsy  ________________________________________________________   Nodule # 5: Location: Left; Mid Maximum size: 0.6 cm; Other 2 dimensions: 0.6 cm x 0.6 cm Composition: cannot determine (2) Echogenicity: hypoechoic (2) ACR TI-RADS recommendations:  Nodule does not meet criteria for surveillance or biopsy _________________________________________________________   No adenopathy   IMPRESSION: Multinodular thyroid.   Right inferior nodule, (labeled 1, 3.0 cm, TR 3) if a thyroid nodule and not parathyroid, does meet criteria for biopsy. However, the nodule has decreased to 3.0 cm from a prior 3.9 cm, suggesting benign natural history.  If there is motivation for biopsy, would also suggest consideration first of nuclear medicine sestamibi scan, as this could represent a parathyroid gland/adenoma given the location and appearance, versus biopsy.   Isthmic thyroid nodule (labeled 2, 1.1 cm, TR 4) and the isthmic nodule (labeled 3, 1.4 cm, TR 4) both meet criteria for surveillance, as designated by the newly established ACR TI-RADS criteria. Surveillance ultrasound study recommended to be performed annually up to 5 years.  Thyroid nodule FNA (12/25/2019):  A. THYROID, RLP, FINE NEEDLE ASPIRATION:   FINAL MICROSCOPIC DIAGNOSIS:  - Consistent with benign follicular nodule (Bethesda category II)   SPECIMEN ADEQUACY:  Satisfactory for evaluation    Thyroid U/S (12/24/2020): Parenchymal Echotexture: Mildly heterogeneous  Isthmus: 0.6 cm  Right lobe: 5.0 x 1.6 x 2.0 cm  Left lobe: 6.1 x 1.6 x 1.9  cm _______________________________________________________________________   Nodule 1: 0.9 x 0.8 x 0.6 cm solid isoechoic nodule within the isthmus is slightly smaller since prior examination. It is not meet criteria for FNA or imaging surveillance.   _________________________________________________________   Nodule 2: 0.9 x 0.9 x 0.7 cm solid hypoechoic nodule in the inferior left thyroid lobe has decreased in size since the prior examination and does not meet criteria for FNA or imaging surveillance.   _________________________________________________________   Nodule 3: 2.7 x 1.6 x 1.0 cm right inferior thyroid nodule is not significantly changed in size since prior examination. Prior FNA was performed on 12/25/2019. Please correlate with FNA results.   _________________________________________________________   Nodule 4: 0.9 x 0.6 x 0.5 cm isoechoic solid nodule in the superior left thyroid lobe is not significantly changed in size since prior examination. It does not meet criteria for imaging surveillance or FNA.   _________________________________________________________   Nodule 5: 0.7 x 0.7 x 0.6 cm solid hypoechoic nodule in the mid left thyroid lobe does not demonstrate threshold growth since 04/20/2006 where it measured 1.1 x 1.0 x 0.6 cm. This is consistent with a benign etiology.   IMPRESSION: 1. Previously biopsied right inferior thyroid nodule is not significantly changed in size. Please correlate with prior FNA results. 2. Remaining thyroid nodules do not meet criteria for FNA or imaging surveillance.  Pt denies: - feeling nodules in neck - hoarseness - dysphagia - choking  Pt does have a FH of thyroid ds - mother (hypothyroidism). No FH of thyroid cancer. No h/o radiation tx to  head or neck except for RAI treatment 11/04/2018. No herbal supplements. No Biotin use. No recent steroids use.   Pt. also has a history of HL, HTN, GERD, DM2 - managed by  PCP: Lab Results  Component Value Date   HGBA1C 7.0 (A) 08/02/2022   HGBA1C 9.4 (H) 05/01/2022   HGBA1C 7.9 (A) 10/31/2021   HGBA1C 7.5 (H) 04/20/2021   HGBA1C 9.4 (A) 12/20/2020   HGBA1C 7.1 (A) 05/17/2020   HGBA1C 8.3 (H) 11/05/2019   HGBA1C 10.3 (A) 08/11/2019   HGBA1C 7.6 (A) 05/05/2019   HGBA1C 6.9 (A) 01/31/2019   She takes a multivitamin with Biotin 150 mcg daily.  ROS: + see HPI  I reviewed pt's medications, allergies, PMH, social hx, family hx, and changes were documented in the history of present illness. Otherwise, unchanged from my initial visit note.  Past Medical History:  Diagnosis Date   Diabetes mellitus type 2 in obese 04/14/2015   Hyperlipidemia associated with type 2 diabetes mellitus (HCC) 10/31/2021   Hypertension    Hypothyroid    Positive PPD, treated    9 months of treatment   Past Surgical History:  Procedure Laterality Date   CHOLECYSTECTOMY  1999   TONSILLECTOMY  1995   Social History   Social History   Marital status: Married    Spouse name: N/A   Number of children: 2   Occupational History   Geophysical data processor [employer]   Social History Main Topics   Smoking status: Never Smoker   Smokeless tobacco: Never Used   Alcohol use No   Drug use: No   Sexual activity: Not Currently    Partners: Male    Birth control/ protection: OCP, Other-see comments     Comment: husband had vasectomy   Current Outpatient Medications on File Prior to Visit  Medication Sig Dispense Refill   amLODipine (NORVASC) 10 MG tablet TAKE 1 TABLET(10 MG) BY MOUTH DAILY 90 tablet 1   atorvastatin (LIPITOR) 20 MG tablet Take 1 tablet (20 mg total) by mouth daily. 90 tablet 3   glipiZIDE (GLUCOTROL XL) 10 MG 24 hr tablet TAKE 2 TABLETS(20 MG) BY MOUTH DAILY WITH BREAKFAST 180 tablet 1   metFORMIN (GLUCOPHAGE-XR) 500 MG 24 hr tablet Take 3 tablets (1,500 mg total) by mouth daily with breakfast. 1 tablet 0   Multiple  Vitamin (MULTIVITAMIN) tablet Take 1 tablet by mouth daily.     neomycin-polymyxin-hydrocortisone (CORTISPORIN) OTIC solution Place 4 drops into the left ear 4 (four) times daily. 10 mL 0   ondansetron (ZOFRAN-ODT) 4 MG disintegrating tablet DISSOLVE 1 TABLET(4 MG) ON THE TONGUE EVERY 6 HOURS AS NEEDED FOR NAUSEA 30 tablet 1   Semaglutide,0.25 or 0.5MG /DOS, (OZEMPIC, 0.25 OR 0.5 MG/DOSE,) 2 MG/3ML SOPN Inject 0.25 mg into the skin once a week. Inject 0.25 mg once a week for 4 weeks, then inject 0.5 mg once a week for 4 weeks 3 mL 5   valACYclovir (VALTREX) 1000 MG tablet Take 2 tablets (2,000 mg total) by mouth 2 (two) times daily. For 1 day at symptom onset 4 tablet 1   No current facility-administered medications on file prior to visit.   Allergies  Allergen Reactions   Penicillins     REACTION: rash   Ace Inhibitors Cough   Clarithromycin Nausea Only    REACTION: Extreme nausea   Diclofenac Nausea Only   Family History  Problem Relation Age of Onset   Cancer Mother  Heart disease Mother    Stroke Other    Diabetes Other    Alcohol abuse Neg Hx    Mental illness Neg Hx    Breast cancer Neg Hx    PE: BP 130/80   Pulse (!) 101   Ht 6' (1.829 m)   Wt 240 lb 6.4 oz (109 kg)   LMP 09/24/2019 (Exact Date)   SpO2 99%   BMI 32.60 kg/m   Wt Readings from Last 10 Encounters:  12/08/22 240 lb 6.4 oz (109 kg)  10/20/22 240 lb (108.9 kg)  10/13/22 241 lb (109.3 kg)  08/02/22 240 lb 2 oz (108.9 kg)  05/01/22 244 lb 8 oz (110.9 kg)  01/18/22 253 lb 6 oz (114.9 kg)  12/07/21 254 lb 3.2 oz (115.3 kg)  10/31/21 252 lb 2 oz (114.4 kg)  09/19/21 252 lb (114.3 kg)  08/15/21 254 lb (115.2 kg)   Constitutional: overweight, in NAD Eyes:  EOMI, no exophthalmos ENT: no neck masses, no cervical lymphadenopathy Cardiovascular: Tachycardia, RR, No MRG Respiratory: CTA B Musculoskeletal: no deformities Skin:no rashes Neurological: no tremor with outstretched hands  ASSESSMENT: 1.  Subclinical Thyrotoxicosis  2. MNG  3. DM2 -per PCP  PLAN:  1. Patient with a long history of subclinical thyrotoxicosis without thyrotoxic symptoms: No weight loss, heat intolerance, palpitations, anxiety.  She does have whitecoat tachycardia.  Her heart rate checked at home with her Fitbit is usually not elevated. -She has a multinodular goiter and a thyroid uptake and scan showed that 2 of the thyroid nodules were hyperfunctioning.  She was treated with RAI in 10/2018.  She felt better after the treatment and her TFTs normalized. -We did not have to start levothyroxine but we did discuss about how to take this correctly in case we need to start.  She is taking multivitamins and we discussed that these are usually taken at least 4 hours after levothyroxine, in case we need to start -We will recheck her TFTs at today's visit -I will see her back in a year if these are normal  2. MNG -Patient has a history of multinodular goiter seen on ultrasound and 2 of the nodules appears to be hyperfunctioning but she had RAI treatment for these -Thyroid ultrasound from 12/2019 showed a right inferior nodule for which FNA was recommended.  The biopsy returned benign.  Only follow-up was recommended for the isthmic nodule -Latest ultrasound is from 2022 and this showed stable nodules, and the isthmic nodule did not require follow-up -She has no neck compression symptoms or masses felt on palpation of her neck today -Will repeat her thyroid ultrasound next year  Component     Latest Ref Rng 12/08/2022  TSH     0.35 - 5.50 uIU/mL 1.57   T4,Free(Direct)     0.60 - 1.60 ng/dL 1.61   Triiodothyronine,Free,Serum     2.3 - 4.2 pg/mL 3.4   TFTs are normal.  Carlus Pavlov, MD PhD [facility]

## 2022-12-28 ENCOUNTER — Ambulatory Visit: Payer: 59

## 2023-01-17 ENCOUNTER — Other Ambulatory Visit: Payer: Self-pay | Admitting: Family Medicine

## 2023-01-23 ENCOUNTER — Ambulatory Visit
Admission: RE | Admit: 2023-01-23 | Discharge: 2023-01-23 | Disposition: A | Discharge status: Home / Self Care | Payer: 59 | Source: Ambulatory Visit | Attending: Family Medicine

## 2023-01-23 DIAGNOSIS — Z01419 Encounter for gynecological examination (general) (routine) without abnormal findings: Secondary | ICD-10-CM

## 2023-01-30 NOTE — Progress Notes (Unsigned)
    Carrie Merriott T. Lillie Portner, MD, CAQ Sports Medicine [facility] at [facility] [address] [place], [address]  Phone: [phone]  FAX: [phone]  Carrie Ellis - 57 y.o. female  MRN [id number]  Date of Birth: [birth year]  Date: 01/31/2023  PCP: Hannah Beat, MD  Referral: Hannah Beat, MD  No chief complaint on file.  Subjective:   Carrie Ellis is a 57 y.o. very pleasant female patient with There is no height or weight on file to calculate BMI. who presents with the following:  She presents for long-term follow-up and management of diabetes, hypertension, hyperlipidemia.  Diabetes Mellitus: Tolerating Medications: yes Compliance with diet: fair, There is no height or weight on file to calculate BMI. Exercise: minimal / intermittent Avg blood sugars at home: not checking Foot problems: none Hypoglycemia: none No nausea, vomitting, blurred vision, polyuria.  Lab Results  Component Value Date   HGBA1C 7.0 (A) 08/02/2022   HGBA1C 9.4 (H) 05/01/2022   HGBA1C 7.9 (A) 10/31/2021   Lab Results  Component Value Date   MICROALBUR 6.3 (H) 05/01/2022   LDLCALC 91 08/02/2022   CREATININE 0.81 05/01/2022    Wt Readings from Last 3 Encounters:  12/08/22 240 lb 6.4 oz (109 kg)  10/20/22 240 lb (108.9 kg)  10/13/22 241 lb (109.3 kg)    HTN: Tolerating all medications without side effects Stable and at goal No CP, no sob. No HA.  BP Readings from Last 3 Encounters:  12/08/22 130/80  10/20/22 125/82  10/13/22 126/84    Basic Metabolic Panel:    Component Value Date/Time   NA 138 05/01/2022 0936   K 3.8 05/01/2022 0936   CL 103 05/01/2022 0936   CO2 25 05/01/2022 0936   BUN 9 05/01/2022 0936   CREATININE 0.81 05/01/2022 0936   CREATININE 0.79 09/18/2016 1117   GLUCOSE 197 (H) 05/01/2022 0936   CALCIUM 9.5 05/01/2022 0936    Lipids: Doing well, stable. Tolerating meds fine with no SE. Panel reviewed with  patient.  Lipids: Lab Results  Component Value Date   CHOL 158 08/02/2022   Lab Results  Component Value Date   HDL 51.70 08/02/2022   Lab Results  Component Value Date   LDLCALC 91 08/02/2022   Lab Results  Component Value Date   TRIG 73.0 08/02/2022   Lab Results  Component Value Date   CHOLHDL 3 08/02/2022    Lab Results  Component Value Date   ALT 12 05/01/2022   AST 13 05/01/2022   ALKPHOS 81 05/01/2022   BILITOT 0.5 05/01/2022     Review of Systems is noted in the HPI, as appropriate  Objective:   LMP 09/24/2019 (Exact Date)   GEN: No acute distress; alert,appropriate. PULM: Breathing comfortably in no respiratory distress PSYCH: Normally interactive.   Laboratory and Imaging Data:  Assessment and Plan:   ***

## 2023-01-31 ENCOUNTER — Ambulatory Visit: Payer: 59 | Admitting: Family Medicine

## 2023-01-31 ENCOUNTER — Encounter: Payer: Self-pay | Admitting: Family Medicine

## 2023-01-31 VITALS — BP 124/80 | HR 104 | Temp 97.5°F | Ht 70.75 in | Wt 239.2 lb

## 2023-01-31 DIAGNOSIS — H60392 Other infective otitis externa, left ear: Secondary | ICD-10-CM | POA: Diagnosis not present

## 2023-01-31 DIAGNOSIS — Z7984 Long term (current) use of oral hypoglycemic drugs: Secondary | ICD-10-CM

## 2023-01-31 DIAGNOSIS — E1169 Type 2 diabetes mellitus with other specified complication: Secondary | ICD-10-CM | POA: Diagnosis not present

## 2023-01-31 DIAGNOSIS — I10 Essential (primary) hypertension: Secondary | ICD-10-CM | POA: Diagnosis not present

## 2023-01-31 DIAGNOSIS — E119 Type 2 diabetes mellitus without complications: Secondary | ICD-10-CM

## 2023-01-31 DIAGNOSIS — E785 Hyperlipidemia, unspecified: Secondary | ICD-10-CM

## 2023-01-31 LAB — POCT GLYCOSYLATED HEMOGLOBIN (HGB A1C): Hemoglobin A1C: 6.4 % — AB (ref 4.0–5.6)

## 2023-01-31 MED ORDER — METFORMIN HCL ER 500 MG PO TB24: 2000.0000 mg | ORAL_TABLET | Freq: Every day | ORAL | 3 refills | Status: DC

## 2023-04-19 NOTE — Progress Notes (Deleted)
   GYNECOLOGY PROBLEM  VISIT ENCOUNTER NOTE  Subjective:   Carrie Ellis is a 58 y.o. G53P2002 female here for a problem GYN visit.  Current complaints: ***.   Denies abnormal vaginal bleeding, discharge, pelvic pain, problems with intercourse or other gynecologic concerns.    Gynecologic History Patient's last menstrual period was 09/24/2019 (exact date).  Contraception: {method:5051}  Health Maintenance Due  Topic Date Due   Pneumococcal Vaccine 25-37 Years old (2 of 2 - PCV) 10/05/2016   COVID-19 Vaccine (5 - 2024-25 season) 10/15/2022   Diabetic kidney evaluation - eGFR measurement  05/01/2023   Diabetic kidney evaluation - Urine ACR  05/01/2023    The following portions of the patient's history were reviewed and updated as appropriate: allergies, current medications, past family history, past medical history, past social history, past surgical history and problem list.  Review of Systems {ros; complete:30496}   Objective:  LMP 09/24/2019 (Exact Date)  Gen: well appearing, NAD HEENT: no scleral icterus CV: RR Lung: Normal WOB Ext: warm well perfused  PELVIC: Normal appearing external genitalia; normal appearing vaginal mucosa and cervix.  No abnormal discharge noted.  ***Pap smear obtained.  Normal uterine size, no other palpable masses, no uterine or adnexal tenderness.   Assessment and Plan:  1. Concern about appearance of breast (Primary) ***   Please refer to After Visit Summary for other counseling recommendations.   No follow-ups on file.  Federico Flake, MD, MPH, ABFM Attending Physician Faculty Practice- Center for [facility]

## 2023-04-20 ENCOUNTER — Ambulatory Visit: Payer: 59 | Admitting: Family Medicine

## 2023-04-20 DIAGNOSIS — R4689 Other symptoms and signs involving appearance and behavior: Secondary | ICD-10-CM

## 2023-04-30 ENCOUNTER — Other Ambulatory Visit: Payer: Self-pay | Admitting: Family Medicine

## 2023-06-11 ENCOUNTER — Ambulatory Visit (INDEPENDENT_AMBULATORY_CARE_PROVIDER_SITE_OTHER): Admitting: Internal Medicine

## 2023-06-11 ENCOUNTER — Other Ambulatory Visit: Payer: Self-pay | Admitting: Family Medicine

## 2023-06-11 ENCOUNTER — Encounter: Payer: Self-pay | Admitting: Internal Medicine

## 2023-06-11 VITALS — BP 132/84 | HR 97 | Temp 98.4°F | Ht 70.75 in | Wt 240.0 lb

## 2023-06-11 DIAGNOSIS — J069 Acute upper respiratory infection, unspecified: Secondary | ICD-10-CM | POA: Diagnosis not present

## 2023-06-11 DIAGNOSIS — R110 Nausea: Secondary | ICD-10-CM

## 2023-06-11 MED ORDER — BENZONATATE 200 MG PO CAPS: 200.0000 mg | ORAL_CAPSULE | Freq: Three times a day (TID) | ORAL | 0 refills | Status: DC | PRN

## 2023-06-11 NOTE — Progress Notes (Signed)
 Subjective:    Patient ID: Carrie Ellis, female    DOB: [birth year], 58 y.o.   MRN: [id number]  HPI Here due to respiratory symptoms  Does have some allergy symptoms Noted symptoms 3 days ago--runny eyes and sneezing Started zyrtec Some fatigue also Then woke 2 days ago--and woke "like in a tunnel"--hearing off. Not painful Coughing also---generally dry Now voice is off--and throat is uncomfortable Tough sleeping last night---cough when lying down No fever, chills or sweats Does have post nasal drip No headache now---used tylenol for headache 2 days ago  No meds for cough  Current Outpatient Medications on File Prior to Visit  Medication Sig Dispense Refill   amLODipine  (NORVASC ) 10 MG tablet TAKE 1 TABLET(10 MG) BY MOUTH DAILY 90 tablet 1   atorvastatin  (LIPITOR) 20 MG tablet Take 1 tablet (20 mg total) by mouth daily. 90 tablet 3   glipiZIDE  (GLUCOTROL  XL) 10 MG 24 hr tablet TAKE 2 TABLETS(20 MG) BY MOUTH DAILY WITH BREAKFAST 180 tablet 1   metFORMIN  (GLUCOPHAGE -XR) 500 MG 24 hr tablet Take 4 tablets (2,000 mg total) by mouth daily with breakfast. 360 tablet 3   Multiple Vitamin (MULTIVITAMIN) tablet Take 1 tablet by mouth daily.     ondansetron  (ZOFRAN -ODT) 4 MG disintegrating tablet DISSOLVE 1 TABLET(4 MG) ON THE TONGUE EVERY 6 HOURS AS NEEDED FOR NAUSEA 30 tablet 1   No current facility-administered medications on file prior to visit.    Allergies  Allergen Reactions   Penicillins     REACTION: rash   Ace Inhibitors Cough   Clarithromycin Nausea Only    REACTION: Extreme nausea   Diclofenac  Nausea Only    Past Medical History:  Diagnosis Date   Diabetes mellitus type 2 in obese 04/14/2015   Hyperlipidemia associated with type 2 diabetes mellitus (HCC) 10/31/2021   Hypertension    Hypothyroid    Positive PPD, treated    9 months of treatment    Past Surgical History:  Procedure Laterality Date   CHOLECYSTECTOMY  1999   TONSILLECTOMY  1995     Family History  Problem Relation Age of Onset   Cancer Mother    Heart disease Mother    Stroke Other    Diabetes Other    Alcohol abuse Neg Hx    Mental illness Neg Hx    Breast cancer Neg Hx     Social History   Socioeconomic History   Marital status: Married    Spouse name: Not on file   Number of children: Not on file   Years of education: Not on file   Highest education level: Bachelor's degree (e.g., BA, AB, BS)  Occupational History   Occupation: Chief Technology Officer: [employer]    Comment: [employer]  Tobacco Use   Smoking status: Never    Passive exposure: Past (as a child)   Smokeless tobacco: Never  Vaping Use   Vaping status: Never Used  Substance and Sexual Activity   Alcohol use: Not Currently   Drug use: No   Sexual activity: Not Currently    Partners: Male    Birth control/protection: Other-see comments    Comment: husband had vasectomy  Other Topics Concern   Not on file  Social History Narrative   Not on file   Social Drivers of Health   Financial Resource Strain: Medium Risk (08/01/2022)   Overall Financial Resource Strain (CARDIA)    Difficulty of Paying Living Expenses: Somewhat hard  Food Insecurity: Patient Declined (08/01/2022)   Hunger Vital Sign    Worried About Running Out of Food in the Last Year: Patient declined    Ran Out of Food in the Last Year: Patient declined  Transportation Needs: No Transportation Needs (08/01/2022)   PRAPARE - Administrator, Civil Service (Medical): No    Lack of Transportation (Non-Medical): No  Physical Activity: Insufficiently Active (08/01/2022)   Exercise Vital Sign    Days of Exercise per Week: 2 days    Minutes of Exercise per Session: 30 min  Stress: No Stress Concern Present (08/01/2022)   [employer] of Occupational Health - Occupational Stress Questionnaire    Feeling of Stress : Only a little  Social Connections: Unknown  (08/01/2022)   Social Connection and Isolation Panel [NHANES]    Frequency of Communication with Friends and Family: Twice a week    Frequency of Social Gatherings with Friends and Family: Patient declined    Attends Religious Services: More than 4 times per year    Active Member of [employer] or Organizations: Yes    Attends Engineer, structural: More than 4 times per year    Marital Status: Married  Catering manager Violence: Not on file   Review of Systems No loss of smell or taste No N/V Eating okay     Objective:   Physical Exam Constitutional:      Appearance: Normal appearance.  HENT:     Head:     Comments: No sinus tenderness    Right Ear: Tympanic membrane and ear canal normal.     Left Ear: Tympanic membrane and ear canal normal.     Mouth/Throat:     Pharynx: No oropharyngeal exudate or posterior oropharyngeal erythema.  Pulmonary:     Effort: Pulmonary effort is normal.     Breath sounds: Normal breath sounds. No wheezing.  Musculoskeletal:     Cervical back: Neck supple.  Lymphadenopathy:     Cervical: No cervical adenopathy.  Neurological:     Mental Status: She is alert.            Assessment & Plan:

## 2023-06-11 NOTE — Telephone Encounter (Signed)
 Last office visit 06/11/2023 with Dr. Letvak for Viral URI.  Last refilled 09/29/2022 for #30 with 1 refill.  Next Appt: CPE 08/01/2023

## 2023-06-11 NOTE — Assessment & Plan Note (Signed)
 Discussed analgesics OTC cough medications and will Rx benzonatate  If worsens towards the end of the week, would try empiric doxy 100  bid x 7 days

## 2023-06-14 ENCOUNTER — Encounter: Payer: Self-pay | Admitting: Internal Medicine

## 2023-06-15 MED ORDER — DOXYCYCLINE HYCLATE 100 MG PO TABS: 100.0000 mg | ORAL_TABLET | Freq: Two times a day (BID) | ORAL | 0 refills | Status: DC

## 2023-06-21 ENCOUNTER — Ambulatory Visit: Payer: Self-pay

## 2023-06-21 MED ORDER — LEVOFLOXACIN 500 MG PO TABS: 500.0000 mg | ORAL_TABLET | Freq: Every day | ORAL | 0 refills | Status: AC

## 2023-06-21 MED ORDER — LEVOFLOXACIN 500 MG PO TABS: 500.0000 mg | ORAL_TABLET | Freq: Every day | ORAL | 0 refills | Status: DC

## 2023-06-21 NOTE — Telephone Encounter (Signed)
 Carrie Ellis notified as instructed by telephone.  She requested Rx be sent to [facility] in [place].  Levaquin  Rx printed so I faxed it to [facility] at [phone].

## 2023-06-21 NOTE — Addendum Note (Signed)
 Addended by: Wyn Heater on: 06/21/2023 12:56 PM   Modules accepted: Orders

## 2023-06-21 NOTE — Telephone Encounter (Signed)
 She is currently being treated for pneumonia with doxycycline .  I am going to go ahead and change her antibiotics to levofloxacin .  This is really the strongest outpatient antibiotic that you could use for pneumonia.  If she is not improving by Monday, then follow-up with me in the office.

## 2023-06-21 NOTE — Telephone Encounter (Signed)
  Chief Complaint: new onset left lower rib cage pain  Symptoms: mild SOB  Frequency: < 24 hours ago  Pertinent Negatives: Patient denies fever, color to phlegm Disposition: [] ED /[] Urgent Care (no appt availability in office) / [] Appointment(In office/virtual)/ []  [facility]/ [] Home Care/ [] Refused Recommended Disposition /[] [facility]/ [x]  Follow-up with PCP Additional Notes: pt has 2 doses left of Doxycycline . With her new sx pt asking what needs to be done. SHe is concerned that it could be developing into pna. Please call pt Copied from CRM [id number]. Topic: Clinical - Red Word Triage >> Jun 21, 2023 12:11 PM Adonis Hoot wrote: Red Word that prompted transfer to Nurse Triage: pain on side,head congestion ,cough  Left side  Daily walk- SOB  Reason for Disposition . Cough  Answer Assessment - Initial Assessment Questions 1. ONSET: "When did the cough begin?"      2 weeks ago 2. SEVERITY: "How bad is the cough today?"      Coughing is worse with talking  3. SPUTUM: "Describe the color of your sputum" (none, dry cough; clear, white, yellow, green)     clear 4. HEMOPTYSIS: "Are you coughing up any blood?" If so ask: "How much?" (flecks, streaks, tablespoons, etc.)     no 5. DIFFICULTY BREATHING: "Are you having difficulty breathing?" If Yes, ask: "How bad is it?" (e.g., mild, moderate, severe)    - MILD: No SOB at rest, mild SOB with walking, speaks normally in sentences, can lie down, no retractions, pulse < 100.    - MODERATE: SOB at rest, SOB with minimal exertion and prefers to sit, cannot lie down flat, speaks in phrases, mild retractions, audible wheezing, pulse 100-120.    - SEVERE: Very SOB at rest, speaks in single words, struggling to breathe, sitting hunched forward, retractions, pulse > 120      mild 6. FEVER: "Do you have a fever?" If Yes, ask: "What is your temperature, how was it measured, and when did it start?"     no 10. OTHER SYMPTOMS: "Do you have  any other symptoms?" (e.g., runny nose, wheezing, chest pain)      Feels better overall - concerned with pain: Pain left lower side of rib cage that started less than 24 hours ago , SOB is worse when walking, last 24 hours maybe getting sx  Protocols used: Cough - Acute Productive-A-AH

## 2023-06-21 NOTE — Addendum Note (Signed)
 Addended by: Scherrie Curt on: 06/21/2023 12:41 PM   Modules accepted: Orders

## 2023-07-10 ENCOUNTER — Telehealth: Payer: Self-pay | Admitting: *Deleted

## 2023-07-10 DIAGNOSIS — Z79899 Other long term (current) drug therapy: Secondary | ICD-10-CM

## 2023-07-10 DIAGNOSIS — E1169 Type 2 diabetes mellitus with other specified complication: Secondary | ICD-10-CM

## 2023-07-10 NOTE — Telephone Encounter (Signed)
-----   Message from Gerry Krone sent at 07/10/2023  2:55 PM EDT ----- Regarding: Lab orders for Wed, 6.11.25 Patient is scheduled for CPX labs, please order future labs, Thanks , [name]

## 2023-07-20 ENCOUNTER — Other Ambulatory Visit: Payer: Self-pay | Admitting: Family Medicine

## 2023-07-23 ENCOUNTER — Encounter: Payer: Self-pay | Admitting: Family Medicine

## 2023-07-25 ENCOUNTER — Other Ambulatory Visit (INDEPENDENT_AMBULATORY_CARE_PROVIDER_SITE_OTHER): Payer: 59

## 2023-07-25 DIAGNOSIS — Z79899 Other long term (current) drug therapy: Secondary | ICD-10-CM

## 2023-07-25 DIAGNOSIS — E1169 Type 2 diabetes mellitus with other specified complication: Secondary | ICD-10-CM

## 2023-07-25 DIAGNOSIS — E669 Obesity, unspecified: Secondary | ICD-10-CM | POA: Diagnosis not present

## 2023-07-25 NOTE — Addendum Note (Signed)
 Addended by: Gerry Krone on: 07/25/2023 07:31 AM   Modules accepted: Orders

## 2023-07-26 LAB — CBC WITH DIFFERENTIAL/PLATELET
Absolute Lymphocytes: 2228 {cells}/uL (ref 850–3900)
Absolute Monocytes: 259 {cells}/uL (ref 200–950)
Basophils Absolute: 28 {cells}/uL (ref 0–200)
Basophils Relative: 0.6 %
Eosinophils Absolute: 169 {cells}/uL (ref 15–500)
Eosinophils Relative: 3.6 %
HCT: 42.7 % (ref 35.0–45.0)
Hemoglobin: 13.4 g/dL (ref 11.7–15.5)
MCH: 26.7 pg — ABNORMAL LOW (ref 27.0–33.0)
MCHC: 31.4 g/dL — ABNORMAL LOW (ref 32.0–36.0)
MCV: 85.2 fL (ref 80.0–100.0)
MPV: 11.2 fL (ref 7.5–12.5)
Monocytes Relative: 5.5 %
Neutro Abs: 2016 {cells}/uL (ref 1500–7800)
Neutrophils Relative %: 42.9 %
Platelets: 284 10*3/uL (ref 140–400)
RBC: 5.01 10*6/uL (ref 3.80–5.10)
RDW: 12.9 % (ref 11.0–15.0)
Total Lymphocyte: 47.4 %
WBC: 4.7 10*3/uL (ref 3.8–10.8)

## 2023-07-26 LAB — MICROALBUMIN / CREATININE URINE RATIO
Creatinine, Urine: 132 mg/dL (ref 20–275)
Microalb Creat Ratio: 19 mg/g{creat} (ref <30)
Microalb, Ur: 2.5 mg/dL

## 2023-07-26 LAB — BASIC METABOLIC PANEL WITH GFR
BUN: 11 mg/dL (ref 7–25)
CO2: 26 mmol/L (ref 20–32)
Calcium: 9.3 mg/dL (ref 8.6–10.4)
Chloride: 103 mmol/L (ref 98–110)
Creat: 0.82 mg/dL (ref 0.50–1.03)
Glucose, Bld: 176 mg/dL — ABNORMAL HIGH (ref 65–99)
Potassium: 4.4 mmol/L (ref 3.5–5.3)
Sodium: 140 mmol/L (ref 135–146)
eGFR: 83 mL/min/{1.73_m2} (ref >=60)

## 2023-07-26 LAB — LIPID PANEL
Cholesterol: 164 mg/dL (ref <200)
HDL: 63 mg/dL (ref >=50)
LDL Cholesterol (Calc): 85 mg/dL
Non-HDL Cholesterol (Calc): 101 mg/dL (ref <130)
Total CHOL/HDL Ratio: 2.6 (calc) (ref <5.0)
Triglycerides: 73 mg/dL (ref <150)

## 2023-07-26 LAB — HEPATIC FUNCTION PANEL
AG Ratio: 1.4 (calc) (ref 1.0–2.5)
ALT: 10 U/L (ref 6–29)
AST: 10 U/L (ref 10–35)
Albumin: 4.2 g/dL (ref 3.6–5.1)
Alkaline phosphatase (APISO): 67 U/L (ref 37–153)
Bilirubin, Direct: 0.1 mg/dL (ref 0.0–0.2)
Globulin: 2.9 g/dL (ref 1.9–3.7)
Indirect Bilirubin: 0.4 mg/dL (ref 0.2–1.2)
Total Bilirubin: 0.5 mg/dL (ref 0.2–1.2)
Total Protein: 7.1 g/dL (ref 6.1–8.1)

## 2023-07-26 LAB — HEMOGLOBIN A1C
Hgb A1c MFr Bld: 7.7 % — ABNORMAL HIGH (ref <5.7)
Mean Plasma Glucose: 174 mg/dL
eAG (mmol/L): 9.7 mmol/L

## 2023-08-01 ENCOUNTER — Encounter: Payer: 59 | Admitting: Family Medicine

## 2023-08-05 NOTE — Progress Notes (Unsigned)
 Carrie Ellis T. Amand Lemoine, MD, CAQ Sports Medicine [facility] [address] [place], [address]  Phone: [phone]  FAX: [phone]  [name] - 58 y.o. female  MRN [id number]  Date of Birth: October 17, 1965  Date: 08/08/2023  PCP: Watt Mirza, MD  Referral: Watt Mirza, MD  No chief complaint on file.  Patient Care Team: Watt Mirza, MD as PCP - General Subjective:   Carrie Ellis is a 58 y.o. pleasant patient who presents with the following:  Health Maintenance Summary Reviewed and updated, unless pt declines services.  Tobacco History Reviewed. Non-smoker Alcohol: No concerns, no excessive use Exercise Habits: Some activity, rec at least 30 mins 5 times a week STD concerns: none Drug Use: None Lumps or breast concerns: no  Prevnar 20 COVID booster Foot exam Eye exam A1c on labs  Diabetes Mellitus: Tolerating Medications: yes Compliance with diet: fair, There is no height or weight on file to calculate BMI. Exercise: minimal / intermittent Avg blood sugars at home: not checking Foot problems: none Hypoglycemia: none No nausea, vomitting, blurred vision, polyuria.  Lab Results  Component Value Date   HGBA1C 7.7 (H) 07/25/2023   HGBA1C 6.4 (A) 01/31/2023   HGBA1C 7.0 (A) 08/02/2022   Lab Results  Component Value Date   MICROALBUR 2.5 07/25/2023   LDLCALC 85 07/25/2023   CREATININE 0.82 07/25/2023    Wt Readings from Last 3 Encounters:  06/11/23 240 lb (108.9 kg)  01/31/23 239 lb 4 oz (108.5 kg)  12/08/22 240 lb 6.4 oz (109 kg)     Health Maintenance  Topic Date Due   Pneumococcal Vaccine 65-53 Years old (2 of 2 - PCV) 10/05/2016   COVID-19 Vaccine (5 - 2024-25 season) 10/15/2022   FOOT EXAM  05/01/2023   OPHTHALMOLOGY EXAM  06/13/2023   INFLUENZA VACCINE  09/14/2023   HEMOGLOBIN A1C  01/24/2024   Cervical Cancer Screening (HPV/Pap Cotest)  04/22/2024   Diabetic kidney  evaluation - eGFR measurement  07/24/2024   Diabetic kidney evaluation - Urine ACR  07/24/2024   MAMMOGRAM  01/22/2025   Fecal DNA (Cologuard)  05/07/2025   DTaP/Tdap/Td (3 - Td or Tdap) 03/22/2026   Hepatitis C Screening  Completed   HIV Screening  Completed   Zoster Vaccines- Shingrix   Completed   HPV VACCINES  Aged Out   Meningococcal B Vaccine  Aged Out    Immunization History  Administered Date(s) Administered   Influenza Inj Mdck Quad Pf 11/23/2020   Influenza Split 11/07/2011   Influenza, Seasonal, Injecte, Preservative Fre 10/20/2022   Influenza,inj,Quad PF,6+ Mos 11/10/2013, 11/20/2014, 10/06/2015, 10/29/2017, 11/19/2018, 11/12/2019, 10/31/2021   PFIZER(Purple Top)SARS-COV-2 Vaccination 04/25/2019, 05/19/2019, 02/01/2020   Pfizer Covid-19 Vaccine Bivalent Booster 72yrs & up 11/23/2020   Pneumococcal Polysaccharide-23 10/06/2015   Td 02/14/2004   Tdap 03/22/2016   Zoster Recombinant(Shingrix ) 04/27/2021, 10/31/2021   Patient Active Problem List   Diagnosis Date Noted   Diabetes mellitus treated with oral medication (HCC) 04/14/2015    Priority: High   Hyperlipidemia associated with type 2 diabetes mellitus (HCC) 10/31/2021    Priority: Medium    Essential hypertension 12/31/2008    Priority: Medium    Multinodular goiter 11/09/2016    Priority: Low   Viral upper respiratory infection 06/11/2023   Thyrotoxicosis without thyroid  storm 11/09/2016   Allergic rhinitis 12/31/2008    Past Medical History:  Diagnosis Date   Diabetes mellitus type 2 in obese 04/14/2015   Hyperlipidemia associated with  type 2 diabetes mellitus (HCC) 10/31/2021   Hypertension    Hypothyroid    Positive PPD, treated    9 months of treatment    Past Surgical History:  Procedure Laterality Date   CHOLECYSTECTOMY  1999   TONSILLECTOMY  1995    Family History  Problem Relation Age of Onset   Cancer Mother    Heart disease Mother    Stroke Other    Diabetes Other    Alcohol abuse Neg  Hx    Mental illness Neg Hx    Breast cancer Neg Hx     Social History   Social History Narrative   Not on file    Past Medical History, Surgical History, Social History, Family History, Problem List, Medications, and Allergies have been reviewed and updated if relevant.  Review of Systems: Pertinent positives are listed above.  Otherwise, a full 14 point review of systems has been done in full and it is negative except where it is noted positive.  Objective:   LMP 09/24/2019 (Exact Date)  Ideal Body Weight:   No results found.    06/11/2023   11:05 AM 05/01/2022    9:07 AM 09/19/2021    1:26 PM 04/27/2021    9:29 AM 11/12/2019    2:53 PM  Depression screen PHQ 2/9  Decreased Interest 0 0 0 0 0  Down, Depressed, Hopeless 0 0 0 0 0  PHQ - 2 Score 0 0 0 0 0  Altered sleeping   0    Tired, decreased energy   0    Change in appetite   0    Feeling bad or failure about yourself    0    Trouble concentrating   0    Moving slowly or fidgety/restless   0    Suicidal thoughts   0    PHQ-9 Score   0    Difficult doing work/chores   Not difficult at all       GEN: well developed, well nourished, no acute distress Eyes: conjunctiva and lids normal, PERRLA, EOMI ENT: TM clear, nares clear, oral exam WNL Neck: supple, no lymphadenopathy, no thyromegaly, no JVD Pulm: clear to auscultation and percussion, respiratory effort normal CV: regular rate and rhythm, S1-S2, no murmur, rub or gallop, no bruits Chest: no scars, masses, no lumps BREAST: breast exam declined GI: soft, non-tender; no hepatosplenomegaly, masses; active bowel sounds all quadrants GU: GU exam declined Lymph: no cervical, axillary or inguinal adenopathy MSK: gait normal, muscle tone and strength WNL, no joint swelling, effusions, discoloration, crepitus  SKIN: clear, good turgor, color WNL, no rashes, lesions, or ulcerations Neuro: normal mental status, normal strength, sensation, and motion Psych: alert; oriented  to person, place and time, normally interactive and not anxious or depressed in appearance.   All labs reviewed with patient. Results for orders placed or performed in visit on 07/25/23  Microalbumin / creatinine urine ratio   Collection Time: 07/25/23  7:31 AM  Result Value Ref Range   Creatinine, Urine 132 20 - 275 mg/dL   Microalb, Ur 2.5 mg/dL   Microalb Creat Ratio 19 <30 mg/g creat  Hemoglobin A1c   Collection Time: 07/25/23  7:31 AM  Result Value Ref Range   Hgb A1c MFr Bld 7.7 (H) <5.7 %   Mean Plasma Glucose 174 mg/dL   eAG (mmol/L) 9.7 mmol/L  Hepatic function panel   Collection Time: 07/25/23  7:31 AM  Result Value Ref Range   Total Protein  7.1 6.1 - 8.1 g/dL   Albumin 4.2 3.6 - 5.1 g/dL   Globulin 2.9 1.9 - 3.7 g/dL (calc)   AG Ratio 1.4 1.0 - 2.5 (calc)   Total Bilirubin 0.5 0.2 - 1.2 mg/dL   Bilirubin, Direct 0.1 0.0 - 0.2 mg/dL   Indirect Bilirubin 0.4 0.2 - 1.2 mg/dL (calc)   Alkaline phosphatase (APISO) 67 37 - 153 U/L   AST 10 10 - 35 U/L   ALT 10 6 - 29 U/L  CBC with Differential/Platelet   Collection Time: 07/25/23  7:31 AM  Result Value Ref Range   WBC 4.7 3.8 - 10.8 Thousand/uL   RBC 5.01 3.80 - 5.10 Million/uL   Hemoglobin 13.4 11.7 - 15.5 g/dL   HCT 57.2 64.9 - 54.9 %   MCV 85.2 80.0 - 100.0 fL   MCH 26.7 (L) 27.0 - 33.0 pg   MCHC 31.4 (L) 32.0 - 36.0 g/dL   RDW 87.0 88.9 - 84.9 %   Platelets 284 140 - 400 Thousand/uL   MPV 11.2 7.5 - 12.5 fL   Neutro Abs 2,016 1,500 - 7,800 cells/uL   Absolute Lymphocytes 2,228 850 - 3,900 cells/uL   Absolute Monocytes 259 200 - 950 cells/uL   Eosinophils Absolute 169 15 - 500 cells/uL   Basophils Absolute 28 0 - 200 cells/uL   Neutrophils Relative % 42.9 %   Total Lymphocyte 47.4 %   Monocytes Relative 5.5 %   Eosinophils Relative 3.6 %   Basophils Relative 0.6 %  Basic metabolic panel   Collection Time: 07/25/23  7:31 AM  Result Value Ref Range   Glucose, Bld 176 (H) 65 - 99 mg/dL   BUN 11 7 - 25  mg/dL   Creat 9.17 9.49 - 8.96 mg/dL   eGFR 83 > OR = 60 fO/fpw/8.26f7   BUN/Creatinine Ratio SEE NOTE: 6 - 22 (calc)   Sodium 140 135 - 146 mmol/L   Potassium 4.4 3.5 - 5.3 mmol/L   Chloride 103 98 - 110 mmol/L   CO2 26 20 - 32 mmol/L   Calcium  9.3 8.6 - 10.4 mg/dL  Lipid panel   Collection Time: 07/25/23  7:31 AM  Result Value Ref Range   Cholesterol 164 <200 mg/dL   HDL 63 > OR = 50 mg/dL   Triglycerides 73 <849 mg/dL   LDL Cholesterol (Calc) 85 mg/dL (calc)   Total CHOL/HDL Ratio 2.6 <5.0 (calc)   Non-HDL Cholesterol (Calc) 101 <130 mg/dL (calc)   No results found.  Assessment and Plan:     ICD-10-CM   1. Healthcare maintenance  Z00.00       Health Maintenance Exam: The patient's preventative maintenance and recommended screening tests for an annual wellness exam were reviewed in full today. Brought up to date unless services declined.  Counselled on the importance of diet, exercise, and its role in overall health and mortality. The patient's FH and SH was reviewed, including their home life, tobacco status, and drug and alcohol status.  Follow-up in 1 year for physical exam or additional follow-up below.  Disposition: No follow-ups on file.  Future Appointments  Date Time Provider Department Center  08/08/2023  9:40 AM Watt Mirza, MD [facility]  12/10/2023  9:00 AM Trixie File, MD [facility] None    No orders of the defined types were placed in this encounter.  There are no discontinued medications. No orders of the defined types were placed in this encounter.   Signed,  Mirza DASEN. Haward Pope, MD  Allergies as of 08/08/2023       Reactions   Penicillins    REACTION: rash   Ace Inhibitors Cough   Clarithromycin Nausea Only   REACTION: Extreme nausea   Diclofenac  Nausea Only        Medication List        Accurate as of August 05, 2023  1:57 PM. If you have any questions, ask your nurse or doctor.          amLODipine  10 MG  tablet Commonly known as: NORVASC  TAKE 1 TABLET(10 MG) BY MOUTH DAILY   atorvastatin  20 MG tablet Commonly known as: LIPITOR Take 1 tablet (20 mg total) by mouth daily.   benzonatate  200 MG capsule Commonly known as: TESSALON  Take 1 capsule (200 mg total) by mouth 3 (three) times daily as needed for cough.   doxycycline  100 MG tablet Commonly known as: VIBRA -TABS Take 1 tablet (100 mg total) by mouth 2 (two) times daily.   glipiZIDE  10 MG 24 hr tablet Commonly known as: GLUCOTROL  XL TAKE 2 TABLETS(20 MG) BY MOUTH DAILY WITH BREAKFAST   metFORMIN  500 MG 24 hr tablet Commonly known as: GLUCOPHAGE -XR Take 4 tablets (2,000 mg total) by mouth daily with breakfast.   multivitamin tablet Take 1 tablet by mouth daily.   ondansetron  4 MG disintegrating tablet Commonly known as: ZOFRAN -ODT DISSOLVE 1 TABLET(4 MG) ON THE TONGUE EVERY 6 HOURS AS NEEDED FOR NAUSEA

## 2023-08-08 ENCOUNTER — Encounter: Payer: Self-pay | Admitting: Family Medicine

## 2023-08-08 ENCOUNTER — Ambulatory Visit: Admitting: Family Medicine

## 2023-08-08 VITALS — BP 126/72 | HR 110 | Temp 97.3°F | Ht 70.5 in | Wt 239.1 lb

## 2023-08-08 DIAGNOSIS — Z23 Encounter for immunization: Secondary | ICD-10-CM | POA: Diagnosis not present

## 2023-08-08 DIAGNOSIS — Z0000 Encounter for general adult medical examination without abnormal findings: Secondary | ICD-10-CM | POA: Diagnosis not present

## 2023-08-08 MED ORDER — ATORVASTATIN CALCIUM 20 MG PO TABS: 20.0000 mg | ORAL_TABLET | Freq: Every day | ORAL | 3 refills | Status: AC

## 2023-08-09 LAB — OPHTHALMOLOGY REPORT-SCANNED

## 2023-10-23 ENCOUNTER — Encounter: Payer: Self-pay | Admitting: Family Medicine

## 2023-10-24 MED ORDER — OZEMPIC (0.25 OR 0.5 MG/DOSE) 2 MG/3ML ~~LOC~~ SOPN: 0.5000 mg | PEN_INJECTOR | SUBCUTANEOUS | 3 refills | Status: AC

## 2023-10-24 MED ORDER — OZEMPIC (0.25 OR 0.5 MG/DOSE) 2 MG/3ML ~~LOC~~ SOPN: 0.2500 mg | PEN_INJECTOR | SUBCUTANEOUS | 0 refills | Status: DC

## 2023-10-30 ENCOUNTER — Other Ambulatory Visit: Payer: Self-pay | Admitting: Family Medicine

## 2023-11-13 ENCOUNTER — Other Ambulatory Visit: Payer: Self-pay | Admitting: Family Medicine

## 2023-11-13 DIAGNOSIS — R110 Nausea: Secondary | ICD-10-CM

## 2023-11-13 NOTE — Telephone Encounter (Signed)
 Last office visit 08/08/23 for CPE.  Last refilled 06/11/2023 for #30 with 1 refill.  Next Appt: 02/11/24 for DM.

## 2023-12-07 ENCOUNTER — Other Ambulatory Visit (HOSPITAL_COMMUNITY): Payer: Self-pay

## 2023-12-10 ENCOUNTER — Encounter: Payer: Self-pay | Admitting: Internal Medicine

## 2023-12-10 ENCOUNTER — Other Ambulatory Visit (HOSPITAL_COMMUNITY): Payer: Self-pay

## 2023-12-10 ENCOUNTER — Ambulatory Visit: Payer: 59 | Admitting: Internal Medicine

## 2023-12-10 ENCOUNTER — Other Ambulatory Visit

## 2023-12-10 ENCOUNTER — Telehealth: Payer: Self-pay

## 2023-12-10 VITALS — BP 122/80 | HR 100 | Ht 70.5 in | Wt 243.0 lb

## 2023-12-10 DIAGNOSIS — E042 Nontoxic multinodular goiter: Secondary | ICD-10-CM

## 2023-12-10 DIAGNOSIS — E0590 Thyrotoxicosis, unspecified without thyrotoxic crisis or storm: Secondary | ICD-10-CM

## 2023-12-10 LAB — T4, FREE: Free T4: 1.7 ng/dL (ref 0.8–1.8)

## 2023-12-10 LAB — TSH: TSH: 1.27 m[IU]/L (ref 0.40–4.50)

## 2023-12-10 LAB — T3, FREE: T3, Free: 2.9 pg/mL (ref 2.3–4.2)

## 2023-12-10 NOTE — Progress Notes (Addendum)
 Patient ID: Carrie Ellis, female   DOB: 05/05/1965, 58 y.o.   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  Last visit 1 year ago.  Interim hx: She is feeling well at this visit, without complaints. No tremors, palpitations, heat intolerance, unintentional weight loss. She did lose 14 lbs before last visit - exercising at the gym, walking, reduced stress eating.  Gained 3 pounds since last visit.  Reviewed history: Pt has had a low TSH since 1990s >> saw Dr. Bonnie Gaskins >> was on medications >> then TFTs normalized >> stopped the medication (cannot remember name). After this, she started to see Dr. Ubaldo and her TFTs started to become abnormal again in 2017. She ended up having RAI tx in 2020 (see below).  Reviewed her TFTs: Lab Results  Component Value Date   TSH 1.57 12/08/2022   TSH 1.44 05/01/2022   TSH 1.28 12/07/2021   TSH 1.92 04/20/2021   TSH 1.59 12/07/2020   TSH 1.20 12/04/2019   TSH 1.14 06/04/2019   TSH 1.44 03/31/2019   TSH 0.81 01/31/2019   TSH <0.01 (L) 12/12/2018   FREET4 1.15 12/08/2022   FREET4 1.18 05/01/2022   FREET4 1.14 12/07/2021   FREET4 1.04 04/20/2021   FREET4 1.15 12/07/2020   FREET4 1.09 12/04/2019   FREET4 0.99 06/04/2019   FREET4 0.95 03/31/2019   FREET4 0.91 01/31/2019   FREET4 1.25 12/12/2018   Lab Results  Component Value Date   T3FREE 3.4 12/08/2022   T3FREE 2.6 05/01/2022   T3FREE 3.4 12/07/2021   T3FREE 3.6 04/20/2021   T3FREE 3.0 12/07/2020   T3FREE 3.4 12/04/2019   T3FREE 3.3 06/04/2019   T3FREE 3.1 03/31/2019   T3FREE 3.0 01/31/2019   T3FREE 3.3 12/12/2018  2011: TSH 0.29, normal free hormones  Her TSI antibodies were not elevated: Lab Results  Component Value Date   TSI <89 11/09/2016   Reviewed the rest of the investigation and management: Thyroid  ultrasound (04/20/2006  compared with 05/13/2004) showed multinodular goiter with slight interval increase in size of the right inferior thyroid  nodule and left inferior thyroid  nodule, but otherwise stable size of the nodules, with slightly enlarged thyroid .  Thyroid  uptake and scan (11/28/2016): She had toxic bilateral thyroid  adenomas. Hyper functional BILATERAL thyroid  nodules with suppression of uptake in remaining thyroid  tissue compatible with hyper functional thyroid  adenomas. 24 hour radio iodine uptake of 26%.  She had a hard time deciding whether to have the RAI treatment or not and she finally decided for this in 2020:  Thyroid  uptake (09/24/2018): elevated: 21% (5 to 15%), 37.2% at 24 hours (10 to 30%).  RAI treatment (11/04/2018): 31.2 mCi  Thyroid  ultrasound (12/17/2019): Parenchymal Echotexture: Moderately heterogenous Isthmus: 0.9 cm Right lobe: 5.6 cm x 2.0 cm x 2.1 cm Left lobe: 6.1 cm x 1.4 cm x 2.2 cm __________________________________________________________________   Nodule # 1: Location: Right; Inferior Maximum size: 3.0 cm; Other 2 dimensions: 1.8 cm x 1.5 cm. This nodule has decreased in size from the prior of 3.9 cm in 2008  Composition: mixed cystic and solid (1) Echogenicity: hypoechoic (2) ACR TI-RADS recommendations: Nodule meets criteria for biopsy  ________________________________________________________   Nodule # 2: Location: Isthmus; mid Maximum size: 1.1 cm; Other 2 dimensions: 0.9 cm x 0.8 cm Composition: solid/almost completely solid (2) Echogenicity: hyperechoic (1) Shape: taller-than-wide (3) ACR TI-RADS recommendations: Nodule meets criteria  for surveillance _________________________________________________________   Nodule # 3:  Location: Isthmus; mid Maximum size: 1.4 cm; Other 2 dimensions: 1.2 cm x 0.8 cm Composition: cannot determine (2)  Echogenicity: hypoechoic (2) ACR TI-RADS recommendations: Nodule meets criteria for  surveillance _________________________________________________________   Nodule # 4:  Location: Left; Superior  Maximum size: 0.8 cm; Other 2 dimensions: 0.7 cm x 0.4 cm Composition: cannot determine (2) Echogenicity: isoechoic (1) ACR TI-RADS recommendations: Nodule does not meet criteria for surveillance or biopsy  ________________________________________________________   Nodule # 5: Location: Left; Mid Maximum size: 0.6 cm; Other 2 dimensions: 0.6 cm x 0.6 cm Composition: cannot determine (2) Echogenicity: hypoechoic (2) ACR TI-RADS recommendations:  Nodule does not meet criteria for surveillance or biopsy _________________________________________________________   No adenopathy   IMPRESSION: Multinodular thyroid .   Right inferior nodule, (labeled 1, 3.0 cm, TR 3) if a thyroid  nodule and not parathyroid, does meet criteria for biopsy. However, the nodule has decreased to 3.0 cm from a prior 3.9 cm, suggesting benign natural history.  If there is motivation for biopsy, would also suggest consideration first of nuclear medicine sestamibi scan, as this could represent a parathyroid gland/adenoma given the location and appearance, versus biopsy.   Isthmic thyroid  nodule (labeled 2, 1.1 cm, TR 4) and the isthmic nodule (labeled 3, 1.4 cm, TR 4) both meet criteria for surveillance, as designated by the newly established ACR TI-RADS criteria. Surveillance ultrasound study recommended to be performed annually up to 5 years.  Thyroid  nodule FNA (12/25/2019):  A. THYROID , RLP, FINE NEEDLE ASPIRATION:   FINAL MICROSCOPIC DIAGNOSIS:  - Consistent with benign follicular nodule (Bethesda category II)   SPECIMEN ADEQUACY:  Satisfactory for evaluation    Thyroid  U/S (12/24/2020): Parenchymal Echotexture: Mildly heterogeneous  Isthmus: 0.6 cm  Right lobe: 5.0 x 1.6 x 2.0 cm  Left lobe: 6.1 x 1.6 x 1.9  cm _______________________________________________________________________   Nodule 1: 0.9 x 0.8 x 0.6 cm solid isoechoic nodule within the isthmus is slightly smaller since prior examination. It is not meet criteria for FNA or imaging surveillance.  _________________________________________________________   Nodule 2: 0.9 x 0.9 x 0.7 cm solid hypoechoic nodule in the inferior left thyroid  lobe has decreased in size since the prior examination and does not meet criteria for FNA or imaging surveillance.  _________________________________________________________   Nodule 3: 2.7 x 1.6 x 1.0 cm right inferior thyroid  nodule is not significantly changed in size since prior examination. Prior FNA was performed on 12/25/2019. Please correlate with FNA results.  _________________________________________________________   Nodule 4: 0.9 x 0.6 x 0.5 cm isoechoic solid nodule in the superior left thyroid  lobe is not significantly changed in size since prior examination. It does not meet criteria for imaging surveillance or FNA.  _________________________________________________________   Nodule 5: 0.7 x 0.7 x 0.6 cm solid hypoechoic nodule in the mid left thyroid  lobe does not demonstrate threshold growth since 04/20/2006 where it measured 1.1 x 1.0 x 0.6 cm. This is consistent with a benign etiology.   IMPRESSION: 1. Previously biopsied right inferior thyroid  nodule is not significantly changed in size. Please correlate with prior FNA results. 2. Remaining thyroid  nodules do not meet criteria for FNA or imaging surveillance.  Pt denies: - feeling nodules in neck - hoarseness - dysphagia - choking  Pt does have a FH of thyroid  ds - mother (hypothyroidism). No FH of thyroid  cancer. No h/o radiation tx to head or neck except for RAI treatment 11/04/2018. No herbal supplements. No recent steroids use. She  takes a multivitamin with Biotin 150 mcg daily.  Pt. also has a history of HL, HTN,  GERD, DM2 - managed by PCP: Lab Results  Component Value Date   HGBA1C 7.7 (H) 07/25/2023   HGBA1C 6.4 (A) 01/31/2023   HGBA1C 7.0 (A) 08/02/2022   HGBA1C 9.4 (H) 05/01/2022   HGBA1C 7.9 (A) 10/31/2021   HGBA1C 7.5 (H) 04/20/2021   HGBA1C 9.4 (A) 12/20/2020   HGBA1C 7.1 (A) 05/17/2020   HGBA1C 8.3 (H) 11/05/2019   HGBA1C 10.3 (A) 08/11/2019  She was recently advised about weight loss and lifestyle changes and she has an appointment with PCP in 2 months to readdress this.  ROS: + see HPI  I reviewed pt's medications, allergies, PMH, social hx, family hx, and changes were documented in the history of present illness. Otherwise, unchanged from my initial visit note.  Past Medical History:  Diagnosis Date   Diabetes mellitus type 2 in obese 04/14/2015   Hyperlipidemia associated with type 2 diabetes mellitus (HCC) 10/31/2021   Hypertension    Hypothyroid    Positive PPD, treated    9 months of treatment   Past Surgical History:  Procedure Laterality Date   CHOLECYSTECTOMY  1999   TONSILLECTOMY  1995   Social History   Social History   Marital status: Married    Spouse name: N/A   Number of children: 2   Occupational History   Geophysical Data [employer]   Social History Main Topics   Smoking status: Never Smoker   Smokeless tobacco: Never Used   Alcohol use No   Drug use: No   Sexual activity: Not Currently    Partners: Male    Birth control/ protection: OCP, Other-see comments     Comment: husband had vasectomy   Current Outpatient Medications on File Prior to Visit  Medication Sig Dispense Refill   amLODipine  (NORVASC ) 10 MG tablet TAKE 1 TABLET(10 MG) BY MOUTH DAILY 90 tablet 1   atorvastatin  (LIPITOR) 20 MG tablet Take 1 tablet (20 mg total) by mouth daily. 90 tablet 3   glipiZIDE  (GLUCOTROL  XL) 10 MG 24 hr tablet TAKE 2 TABLETS(20 MG) BY MOUTH DAILY WITH BREAKFAST 180 tablet 1   metFORMIN  (GLUCOPHAGE -XR) 500 MG 24  hr tablet Take 4 tablets (2,000 mg total) by mouth daily with breakfast. 360 tablet 3   Multiple Vitamin (MULTIVITAMIN) tablet Take 1 tablet by mouth daily.     ondansetron  (ZOFRAN -ODT) 4 MG disintegrating tablet DISSOLVE 1 TABLET(4 MG) ON THE TONGUE EVERY 6 HOURS AS NEEDED FOR NAUSEA 30 tablet 1   Semaglutide ,0.25 or 0.5MG /DOS, (OZEMPIC , 0.25 OR 0.5 MG/DOSE,) 2 MG/3ML SOPN Inject 0.25 mg into the skin once a week. 3 mL 0   Semaglutide ,0.25 or 0.5MG /DOS, (OZEMPIC , 0.25 OR 0.5 MG/DOSE,) 2 MG/3ML SOPN Inject 0.5 mg into the skin once a week. 3 mL 3   No current facility-administered medications on file prior to visit.   Allergies  Allergen Reactions   Penicillins     REACTION: rash   Ace Inhibitors Cough   Clarithromycin Nausea Only    REACTION: Extreme nausea   Diclofenac  Nausea Only   Family History  Problem Relation Age of Onset   Cancer Mother    Heart disease Mother    Stroke Other    Diabetes Other    Alcohol abuse Neg Hx    Mental illness Neg Hx    Breast cancer Neg Hx    PE:  BP 122/80   Pulse 100   Ht 5' 10.5 (1.791 m)   Wt 243 lb (110.2 kg)   LMP 09/24/2019 (Exact Date)   SpO2 98%   BMI 34.37 kg/m   Wt Readings from Last 10 Encounters:  12/10/23 243 lb (110.2 kg)  08/08/23 239 lb 2 oz (108.5 kg)  06/11/23 240 lb (108.9 kg)  01/31/23 239 lb 4 oz (108.5 kg)  12/08/22 240 lb 6.4 oz (109 kg)  10/20/22 240 lb (108.9 kg)  10/13/22 241 lb (109.3 kg)  08/02/22 240 lb 2 oz (108.9 kg)  05/01/22 244 lb 8 oz (110.9 kg)  01/18/22 253 lb 6 oz (114.9 kg)   Constitutional: overweight, in NAD Eyes:  EOMI, no exophthalmos ENT: no neck masses, no cervical lymphadenopathy Cardiovascular: Tachycardia, RR, No MRG Respiratory: CTA B Musculoskeletal: no deformities Skin:no rashes Neurological: no tremor with outstretched hands  ASSESSMENT: 1. Subclinical Thyrotoxicosis  2. MNG  3. DM2 -per PCP  PLAN:  1. Patient with history of subclinical thyrotoxicosis without  thyrotoxic symptoms: No weight loss, heat intolerance, palpitations, anxiety.  She does have whitecoat tachycardia.  Her heart rate checked at home with her Fitbit was not usually elevated. - She has a multinodular goiter and a thyroid  uptake and scan previously showed that 2 of the thyroid  nodules were hyperfunctioning.  She was treated with RAI in 10/2018.  She felt better after the treatment and her TFTs normalized. -We did not have to start levothyroxine but we did discuss about how to take this correctly in case we need to start. She is taking multivitamins and we discussed that these are usually taken at least 4 hours after levothyroxine, in case we need to start - We will recheck her TFTs at today's visit - I will see her back in a year if these are normal, but we discussed about possibly seeing PCP afterwards.  She will think about it.  2. MNG -Patient has a history of a multinodular goiter seen on ultrasound with 2 hyperfunctioning nodules for which she had RAI treatment.  We discussed that usually after RAI treatment, the whole thyroid  gland and also many of the nodules will decrease in size. -Her thyroid  ultrasound from 12/2019 showed a right inferior nodule, for which an FNA was performed and returned benign.  Only follow-up was recommended for the isthmic nodule.  In 2022, nodules appeared to be stable, while the isthmic nodule did not require imaging follow-up - No neck compression symptoms or masses felt on palpation of her neck today - We will repeat an ultrasound now and discontinue follow-up if the nodules are stable  Orders Placed This Encounter  Procedures   US  THYROID    TSH   T4, free   T3, free   Component     Latest Ref Rng 12/10/2023  Triiodothyronine,Free,Serum     2.3 - 4.2 pg/mL 2.9   T4,Free(Direct)     0.8 - 1.8 ng/dL 1.7   TSH     9.59 - 5.49 mIU/L 1.27   TFTs are all normal.  Thyroid  ultrasound (12/19/2023): Parenchymal Echotexture: Mildly heterogeneous   Isthmus: 0.9 cm ,previously 0.6 cm  Right lobe: 5.7 x 2.0 x 2.3 cm ,previously 5.0 x 1.6 x 2.0 cm  Left lobe: 5.5 x 1.4 x 2.2 cm ,previously 6.1 x 1.6 x 1.9 cm  ______________________________________________________   Estimated total number of nodules >/= 1 cm: 2 ______________________________________________________   Similar appearance of previously biopsied right posterior and inferior solid thyroid  nodule (  labeled 1, 2.3 cm, previously 2.8 cm).   Slight interval enlargement of previously visualized spongiform and benign appearing left superior thyroid  nodule (labeled 2, 1.3 cm, previously 0.9 cm).   Similar benign-appearing left inferior solid thyroid  nodule (labeled 3, 0.7 cm, previously 0.7 cm).   No cervical lymphadenopathy.   IMPRESSION: 1. Similar appearing multinodular thyroid . 2. Similar appearance of previously biopsied right inferior thyroid  nodule (labeled 1, 2.3 cm, previously 2.8 cm). Recommend correlation with prior biopsy results.  One of the thyroid  nodules is slightly larger.  Will continue to keep an eye on this.  Carrie Fendt, MD PhD [facility]

## 2023-12-10 NOTE — Telephone Encounter (Signed)
 Pharmacy Patient Advocate Encounter   Received notification from Onbase that prior authorization for Ozempic  2 is required/requested.   Insurance verification completed.   The patient is insured through [facility].   Per test claim: PA required; PA started via CoverMyMeds. KEY [id number] . Waiting for clinical questions to populate.

## 2023-12-10 NOTE — Patient Instructions (Addendum)
 Please stop at the lab.  Expect a call from [facility] to schedule the new thyroid  U/S.  Please come back for a follow-up appointment in 1 year.

## 2023-12-10 NOTE — Telephone Encounter (Signed)
 Pharmacy Patient Advocate Encounter  Received notification from [facility] that Prior Authorization for Ozempic  2 has been APPROVED from 12/10/23 to 12/09/24. Ran test claim, Copay is $137.55. This test claim was processed through [facility]- copay amounts may vary at other pharmacies due to pharmacy/plan contracts, or as the patient moves through the different stages of their insurance plan.   PA #/Case ID/Reference #: # [id number]

## 2023-12-11 ENCOUNTER — Ambulatory Visit: Payer: Self-pay | Admitting: Internal Medicine

## 2023-12-19 ENCOUNTER — Ambulatory Visit
Admission: RE | Admit: 2023-12-19 | Discharge: 2023-12-19 | Disposition: A | Discharge status: Home / Self Care | Source: Ambulatory Visit | Attending: Internal Medicine | Admitting: Internal Medicine

## 2023-12-19 DIAGNOSIS — E042 Nontoxic multinodular goiter: Secondary | ICD-10-CM

## 2024-01-01 ENCOUNTER — Other Ambulatory Visit: Payer: Self-pay | Admitting: Family Medicine

## 2024-01-01 DIAGNOSIS — Z1231 Encounter for screening mammogram for malignant neoplasm of breast: Secondary | ICD-10-CM

## 2024-01-09 ENCOUNTER — Ambulatory Visit
Admission: RE | Admit: 2024-01-09 | Discharge: 2024-01-09 | Disposition: A | Discharge status: Home / Self Care | Source: Ambulatory Visit | Attending: Internal Medicine | Admitting: Internal Medicine

## 2024-01-09 ENCOUNTER — Telehealth: Payer: Self-pay | Admitting: Family Medicine

## 2024-01-09 VITALS — BP 113/80 | HR 108 | Temp 98.2°F | Resp 17

## 2024-01-09 DIAGNOSIS — R42 Dizziness and giddiness: Secondary | ICD-10-CM | POA: Diagnosis not present

## 2024-01-09 MED ORDER — MECLIZINE HCL 25 MG PO TABS: 25.0000 mg | ORAL_TABLET | Freq: Three times a day (TID) | ORAL | 0 refills | Status: AC | PRN

## 2024-01-09 NOTE — Telephone Encounter (Signed)
 Copied from CRM [id number]. Topic: Clinical - Refused Triage >> Jan 09, 2024  9:25 AM Donna BRAVO wrote: Patient/caller voiced complaints of Communication  Patient  Symptoms:  -lightheaded and dizziness comes and goes  -first time Friday evening  -Tuesday afternoon happened again  -Glucose 138 yesterday    . Declined transfer to triage.

## 2024-01-09 NOTE — Discharge Instructions (Addendum)
 Intermittent dizziness: Dizziness is a difficult condition to diagnose a definitive cause especially in urgent care setting.  Today we have done blood work including complete blood count and complete metabolic panel.  This will check for any abnormalities such as anemia, elevated Luzmaria Devaux blood cells, kidney problems, liver problems or electrolyte issues.  This blood work will however take 24 to 48 hours to finalize.  If there is any abnormalities we will contact you.  We have also done orthostatic blood pressures today which showed only a very mild drop in blood pressure from lying to sitting but no change from sitting to standing.  It is unlikely that blood pressure is contributing to the symptoms.  Do not believe that blood sugar is the issue.  Symptoms could be secondary to vertigo or could be secondary to the changes and the weather although an intracranial source can not be ruled out.  For now we will recommend staying hydrated, can try over-the-counter Allegra or Claritin, avoid rapidly standing and we will wait for the results of the lab work.  If your symptoms worsen significantly then recommend going to the emergency room for further evaluation as more advanced imaging may be necessary at that time.  If your symptoms persist but do not worsen then you may want to follow-up with your primary care provider.  We can call in meclizine  which does help to treat vertigo for you to have as needed.

## 2024-01-09 NOTE — ED Provider Notes (Signed)
 [facility]    CSN: [id number] Arrival date & time: 01/09/24  1407      History   Chief Complaint Chief Complaint  Patient presents with   Dizziness    Entered by patient    HPI Carrie Ellis is a 58 y.o. female.   58 year old female presents urgent care with complaints of intermittent dizziness.  She reports on Friday while she was at work she had an episode of dizziness feeling like her head was tumbling.  She was sitting at her desk when this occurred.  She took one of her husbands meclizine  and her symptoms got better but then she noted dizziness again yesterday.  This resolved after taking a meclizine  as well.  She woke up this morning and when she first got out of bed she was dizzy but this dizziness only lasted for about a minute before it resolved.  She has never had symptoms like this in the past.  She has no history of vertigo.  She did check her blood sugar this morning and this afternoon.  This morning her blood sugar was 176 and then around 1245 it was 85.  She denies any fevers, chills, shortness of breath, cough, dysuria, abdominal pain, nasal congestion, loss of consciousness, visual changes.  She does feel like she has had some fullness in her ears for the last 1 to 2 weeks.   Dizziness Associated symptoms: no chest pain, no headaches, no palpitations, no shortness of breath and no vomiting     Past Medical History:  Diagnosis Date   Diabetes mellitus type 2 in obese 04/14/2015   Hyperlipidemia associated with type 2 diabetes mellitus (HCC) 10/31/2021   Hypertension    Hypothyroid    Positive PPD, treated    9 months of treatment    Patient Active Problem List   Diagnosis Date Noted   Viral upper respiratory infection 06/11/2023   Hyperlipidemia associated with type 2 diabetes mellitus (HCC) 10/31/2021   Thyrotoxicosis without thyroid  storm 11/09/2016   Multinodular goiter 11/09/2016   Diabetes mellitus treated with oral medication (HCC)  04/14/2015   Essential hypertension 12/31/2008   Allergic rhinitis 12/31/2008    Past Surgical History:  Procedure Laterality Date   CHOLECYSTECTOMY  1999   TONSILLECTOMY  1995    OB History     Gravida  2   Para  2   Term  2   Preterm      AB      Living  2      SAB      IAB      Ectopic      Multiple      Live Births  2            Home Medications    Prior to Admission medications   Medication Sig Start Date End Date Taking? Authorizing Provider  amLODipine  (NORVASC ) 10 MG tablet TAKE 1 TABLET(10 MG) BY MOUTH DAILY 07/20/23   Copland, Jacques, MD  atorvastatin  (LIPITOR) 20 MG tablet Take 1 tablet (20 mg total) by mouth daily. 08/08/23   Copland, Jacques, MD  glipiZIDE  (GLUCOTROL  XL) 10 MG 24 hr tablet TAKE 2 TABLETS(20 MG) BY MOUTH DAILY WITH BREAKFAST 10/30/23   Copland, Jacques, MD  metFORMIN  (GLUCOPHAGE -XR) 500 MG 24 hr tablet Take 4 tablets (2,000 mg total) by mouth daily with breakfast. 01/31/23   Copland, Jacques, MD  Multiple Vitamin (MULTIVITAMIN) tablet Take 1 tablet by mouth daily.    [provider]  ondansetron  (ZOFRAN -ODT) 4 MG disintegrating tablet DISSOLVE 1 TABLET(4 MG) ON THE TONGUE EVERY 6 HOURS AS NEEDED FOR NAUSEA 11/13/23   Copland, Jacques, MD  Semaglutide ,0.25 or 0.5MG /DOS, (OZEMPIC , 0.25 OR 0.5 MG/DOSE,) 2 MG/3ML SOPN Inject 0.25 mg into the skin once a week. 10/24/23   Copland, Jacques, MD  Semaglutide ,0.25 or 0.5MG /DOS, (OZEMPIC , 0.25 OR 0.5 MG/DOSE,) 2 MG/3ML SOPN Inject 0.5 mg into the skin once a week. 10/24/23   Copland, Jacques, MD    Family History Family History  Problem Relation Age of Onset   Cancer Mother    Heart disease Mother    Stroke Other    Diabetes Other    Alcohol abuse Neg Hx    Mental illness Neg Hx    Breast cancer Neg Hx     Social History Social History   Tobacco Use   Smoking status: Never    Passive exposure: Past (as a child)   Smokeless tobacco: Never  Vaping Use   Vaping status:  Never Used  Substance Use Topics   Alcohol use: Not Currently   Drug use: No     Allergies   Penicillins, Ace inhibitors, Clarithromycin, and Diclofenac    Review of Systems Review of Systems  Constitutional:  Negative for chills and fever.  HENT:  Negative for ear pain and sore throat.   Eyes:  Negative for pain and visual disturbance.  Respiratory:  Negative for cough and shortness of breath.   Cardiovascular:  Negative for chest pain and palpitations.  Gastrointestinal:  Negative for abdominal pain and vomiting.  Genitourinary:  Negative for dysuria and hematuria.  Musculoskeletal:  Negative for arthralgias and back pain.  Skin:  Negative for color change and rash.  Neurological:  Positive for dizziness and light-headedness. Negative for seizures, syncope, facial asymmetry and headaches.  All other systems reviewed and are negative.    Physical Exam Triage Vital Signs ED Triage Vitals  Encounter Vitals Group     BP 01/09/24 1414 (!) 145/86     Girls Systolic BP Percentile --      Girls Diastolic BP Percentile --      Boys Systolic BP Percentile --      Boys Diastolic BP Percentile --      Pulse Rate 01/09/24 1414 (!) 107     Resp 01/09/24 1414 17     Temp 01/09/24 1414 98.2 F (36.8 C)     Temp Source 01/09/24 1414 Oral     SpO2 01/09/24 1414 97 %     Weight --      Height --      Head Circumference --      Peak Flow --      Pain Score 01/09/24 1419 0     Pain Loc --      Pain Education --      Exclude from Growth Chart --    No data found.  Updated Vital Signs BP 113/80 (BP Location: Right Arm)   Pulse (!) 108   Temp 98.2 F (36.8 C) (Oral)   Resp 17   LMP 09/24/2019 (Exact Date)   SpO2 97%   Visual Acuity Right Eye Distance:   Left Eye Distance:   Bilateral Distance:    Right Eye Near:   Left Eye Near:    Bilateral Near:     Physical Exam Vitals and nursing note reviewed.  Constitutional:      General: She is not in acute distress.     Appearance: She is  well-developed.  HENT:     Head: Normocephalic and atraumatic.     Right Ear: Tympanic membrane normal. No middle ear effusion.     Left Ear: Tympanic membrane normal.  No middle ear effusion.     Nose: Nose normal.     Mouth/Throat:     Mouth: Mucous membranes are moist.  Eyes:     Extraocular Movements: Extraocular movements intact.     Conjunctiva/sclera: Conjunctivae normal.     Pupils: Pupils are equal, round, and reactive to light.  Cardiovascular:     Rate and Rhythm: Normal rate and regular rhythm.     Heart sounds: No murmur heard. Pulmonary:     Effort: Pulmonary effort is normal. No respiratory distress.     Breath sounds: Normal breath sounds.  Abdominal:     Palpations: Abdomen is soft.     Tenderness: There is no abdominal tenderness.  Musculoskeletal:        General: No swelling.     Cervical back: Neck supple.  Skin:    General: Skin is warm and dry.     Capillary Refill: Capillary refill takes less than 2 seconds.  Neurological:     General: No focal deficit present.     Mental Status: She is alert and oriented to person, place, and time.     Cranial Nerves: No cranial nerve deficit.     Sensory: Sensation is intact.     Motor: No weakness.     Coordination: Coordination is intact.     Gait: Gait normal.  Psychiatric:        Mood and Affect: Mood normal.      UC Treatments / Results  Labs (all labs ordered are listed, but only abnormal results are displayed) Labs Reviewed  COMPREHENSIVE METABOLIC PANEL WITH GFR  CBC    EKG   Radiology No results found.  Procedures Procedures (including critical care time)  Medications Ordered in UC Medications - No data to display  Initial Impression / Assessment and Plan / UC Course  I have reviewed the triage vital signs and the nursing notes.  Pertinent labs & imaging results that were available during my care of the patient were reviewed by me and considered in my medical decision  making (see chart for details).     Dizziness - Plan: Orthostatic vital signs, Comprehensive metabolic panel, CBC, Orthostatic vital signs, Comprehensive metabolic panel, CBC   Intermittent dizziness: Dizziness is a difficult condition to diagnose a definitive cause especially in urgent care setting.  Today we have done blood work including complete blood count and complete metabolic panel.  This will check for any abnormalities such as anemia, elevated Cabela Pacifico blood cells, kidney problems, liver problems or electrolyte issues.  This blood work will however take 24 to 48 hours to finalize.  If there is any abnormalities we will contact you.  We have also done orthostatic blood pressures today which showed only a very mild drop in blood pressure from lying to sitting but no change from sitting to standing.  It is unlikely that blood pressure is contributing to the symptoms.  Do not believe that blood sugar is the issue.  Symptoms could be secondary to vertigo or could be secondary to the changes and the weather although an intracranial source can not be ruled out.  For now we will recommend staying hydrated, can try over-the-counter Allegra or Claritin, avoid rapidly standing and we will wait for the results of the lab work.  If  your symptoms worsen significantly then recommend going to the emergency room for further evaluation as more advanced imaging may be necessary at that time.  If your symptoms persist but do not worsen then you may want to follow-up with your primary care provider.  We can call in meclizine  which does help to treat vertigo for you to have as needed.  Final Clinical Impressions(s) / UC Diagnoses   Final diagnoses:  Dizziness     Discharge Instructions      Intermittent dizziness: Dizziness is a difficult condition to diagnose a definitive cause especially in urgent care setting.  Today we have done blood work including complete blood count and complete metabolic panel.  This  will check for any abnormalities such as anemia, elevated Furman Trentman blood cells, kidney problems, liver problems or electrolyte issues.  This blood work will however take 24 to 48 hours to finalize.  If there is any abnormalities we will contact you.  We have also done orthostatic blood pressures today which showed only a very mild drop in blood pressure from lying to sitting but no change from sitting to standing.  It is unlikely that blood pressure is contributing to the symptoms.  Do not believe that blood sugar is the issue.  Symptoms could be secondary to vertigo or could be secondary to the changes and the weather although an intracranial source can not be ruled out.  For now we will recommend staying hydrated, can try over-the-counter Allegra or Claritin, avoid rapidly standing and we will wait for the results of the lab work.  If your symptoms worsen significantly then recommend going to the emergency room for further evaluation as more advanced imaging may be necessary at that time.  If your symptoms persist but do not worsen then you may want to follow-up with your primary care provider.  We can call in meclizine  which does help to treat vertigo for you to have as needed.     ED Prescriptions   None    PDMP not reviewed this encounter.   Teresa Almarie LABOR, [place] 01/09/24 1444

## 2024-01-09 NOTE — Telephone Encounter (Signed)
 I spoke with pt; first time pt was dizzy where room was spinning around was 01/04/24; after that episode of dizziness pt had lightheadedness on and  off. Pt has had 2 episodes of dizziness since last Fri. 01/08/24 BS was 138 and today one hr after eating 2 eggs, 2 slices of bread, 2 sausage links and 1/2 of avocado pt BS was 174. Pt does not have way to ck BP. Pt concerned since happened twice recently. No available appt at any [facility] office today. Pt scheduled appt at [facility] [place] 01/09/24 at 2:30 with UC & ED precautions and pt voiced understanding. Sending note to Dr Watt and [place].SABRA

## 2024-01-09 NOTE — ED Triage Notes (Signed)
 Pt states Friday while at work she experienced dizziness that felt like her head was tumbling.   She took one of her husbands Meclizine   on Friday which helped  States her blood sugar this morning at 9:20 was 176 At 1246 was 85  She has had bilateral ear fullness for 2-3 weeks.

## 2024-01-10 LAB — COMPREHENSIVE METABOLIC PANEL WITH GFR
ALT: 11 IU/L (ref 0–32)
AST: 15 IU/L (ref 0–40)
Albumin: 4.4 g/dL (ref 3.8–4.9)
Alkaline Phosphatase: 80 IU/L (ref 49–135)
BUN/Creatinine Ratio: 13 (ref 9–23)
BUN: 11 mg/dL (ref 6–24)
Bilirubin Total: 0.3 mg/dL (ref 0.0–1.2)
CO2: 20 mmol/L (ref 20–29)
Calcium: 9.7 mg/dL (ref 8.7–10.2)
Chloride: 103 mmol/L (ref 96–106)
Creatinine, Ser: 0.82 mg/dL (ref 0.57–1.00)
Globulin, Total: 2.9 g/dL (ref 1.5–4.5)
Glucose: 95 mg/dL (ref 70–99)
Potassium: 4 mmol/L (ref 3.5–5.2)
Sodium: 139 mmol/L (ref 134–144)
Total Protein: 7.3 g/dL (ref 6.0–8.5)
eGFR: 83 mL/min/1.73 (ref >59)

## 2024-01-10 LAB — CBC
Hematocrit: 44.2 % (ref 34.0–46.6)
Hemoglobin: 14.1 g/dL (ref 11.1–15.9)
MCH: 27.1 pg (ref 26.6–33.0)
MCHC: 31.9 g/dL (ref 31.5–35.7)
MCV: 85 fL (ref 79–97)
Platelets: 302 x10E3/uL (ref 150–450)
RBC: 5.2 x10E6/uL (ref 3.77–5.28)
RDW: 12.8 % (ref 11.7–15.4)
WBC: 6.3 x10E3/uL (ref 3.4–10.8)

## 2024-01-11 ENCOUNTER — Ambulatory Visit (HOSPITAL_COMMUNITY): Payer: Self-pay

## 2024-01-20 ENCOUNTER — Other Ambulatory Visit: Payer: Self-pay | Admitting: Family Medicine

## 2024-01-28 ENCOUNTER — Inpatient Hospital Stay: Admission: RE | Admit: 2024-01-28 | Discharge: 2024-01-28 | Attending: Family Medicine

## 2024-01-28 DIAGNOSIS — Z1231 Encounter for screening mammogram for malignant neoplasm of breast: Secondary | ICD-10-CM

## 2024-02-04 ENCOUNTER — Ambulatory Visit: Payer: Self-pay | Admitting: Family Medicine

## 2024-02-09 NOTE — Progress Notes (Unsigned)
 "    Dorlene Footman T. Michaeline Eckersley, MD, CAQ Sports Medicine [facility] [address] [place], [address]  Phone: [phone]  FAX: [phone]  NIMAH UPHOFF - 58 y.o. female  MRN [id number]  Date of Birth: [birth year]  Date: 02/11/2024  PCP: Watt Mirza, MD  Referral: Watt Mirza, MD  No chief complaint on file.  Subjective:   Carrie Ellis is a 58 y.o. very pleasant female patient with There is no height or weight on file to calculate BMI. who presents with the following:  Discussed the use of AI scribe software for clinical note transcription with the patient, who gave verbal consent to proceed. She is here for 30-month follow-up of diabetes, hypertension, hyperlipidemia.  Diabetes Mellitus: Tolerating Medications: yes Compliance with diet: fair, There is no height or weight on file to calculate BMI. Exercise: minimal / intermittent Avg blood sugars at home: not checking Foot problems: none Hypoglycemia: none No nausea, vomitting, blurred vision, polyuria.  Lab Results  Component Value Date   HGBA1C 7.7 (H) 07/25/2023   HGBA1C 6.4 (A) 01/31/2023   HGBA1C 7.0 (A) 08/02/2022   Lab Results  Component Value Date   MICROALBUR 2.5 07/25/2023   LDLCALC 85 07/25/2023   CREATININE 0.82 01/09/2024    Wt Readings from Last 3 Encounters:  12/10/23 243 lb (110.2 kg)  08/08/23 239 lb 2 oz (108.5 kg)  06/11/23 240 lb (108.9 kg)    Lipids: Doing well, stable. Tolerating meds fine with no SE. Panel reviewed with patient.  Lipids: Lab Results  Component Value Date   CHOL 164 07/25/2023   Lab Results  Component Value Date   HDL 63 07/25/2023   Lab Results  Component Value Date   LDLCALC 85 07/25/2023   Lab Results  Component Value Date   TRIG 73 07/25/2023   Lab Results  Component Value Date   CHOLHDL 2.6 07/25/2023    Lab Results  Component Value Date   ALT 11 01/09/2024   AST 15 01/09/2024   ALKPHOS  80 01/09/2024   BILITOT 0.3 01/09/2024    HTN: Tolerating all medications without side effects Stable and at goal No CP, no sob. No HA.  BP Readings from Last 3 Encounters:  01/09/24 113/80  12/10/23 122/80  08/08/23 126/72    Basic Metabolic Panel:    Component Value Date/Time   NA 139 01/09/2024 1446   K 4.0 01/09/2024 1446   CL 103 01/09/2024 1446   CO2 20 01/09/2024 1446   BUN 11 01/09/2024 1446   CREATININE 0.82 01/09/2024 1446   CREATININE 0.82 07/25/2023 0731   GLUCOSE 95 01/09/2024 1446   GLUCOSE 176 (H) 07/25/2023 0731   CALCIUM  9.7 01/09/2024 1446    History of Present Illness     Review of Systems is noted in the HPI, as appropriate  Objective:   LMP 09/24/2019   GEN: No acute distress; alert,appropriate. PULM: Breathing comfortably in no respiratory distress PSYCH: Normally interactive.   Laboratory and Imaging Data:  Assessment and Plan:   No diagnosis found. Assessment & Plan   Medication Management during today's office visit: No orders of the defined types were placed in this encounter.  There are no discontinued medications.  Orders placed today for conditions managed today: No orders of the defined types were placed in this encounter.   Disposition: No follow-ups on file.  [employer] One speech-to-text software was used for transcription in this dictation.  Possible transcriptional errors can  occur using Animal nutritionist.   Signed,  Jacques DASEN. Erastus Bartolomei, MD   Outpatient Encounter Medications as of 02/11/2024  Medication Sig   amLODipine  (NORVASC ) 10 MG tablet TAKE 1 TABLET(10 MG) BY MOUTH DAILY   atorvastatin  (LIPITOR) 20 MG tablet Take 1 tablet (20 mg total) by mouth daily.   glipiZIDE  (GLUCOTROL  XL) 10 MG 24 hr tablet TAKE 2 TABLETS(20 MG) BY MOUTH DAILY WITH BREAKFAST   meclizine  (ANTIVERT ) 25 MG tablet Take 1 tablet (25 mg total) by mouth 3 (three) times daily as needed for dizziness.   metFORMIN  (GLUCOPHAGE -XR) 500 MG  24 hr tablet Take 4 tablets (2,000 mg total) by mouth daily with breakfast.   Multiple Vitamin (MULTIVITAMIN) tablet Take 1 tablet by mouth daily.   ondansetron  (ZOFRAN -ODT) 4 MG disintegrating tablet DISSOLVE 1 TABLET(4 MG) ON THE TONGUE EVERY 6 HOURS AS NEEDED FOR NAUSEA   Semaglutide ,0.25 or 0.5MG /DOS, (OZEMPIC , 0.25 OR 0.5 MG/DOSE,) 2 MG/3ML SOPN Inject 0.25 mg into the skin once a week.   Semaglutide ,0.25 or 0.5MG /DOS, (OZEMPIC , 0.25 OR 0.5 MG/DOSE,) 2 MG/3ML SOPN Inject 0.5 mg into the skin once a week.   No facility-administered encounter medications on file as of 02/11/2024.   "

## 2024-02-11 ENCOUNTER — Encounter: Payer: Self-pay | Admitting: Family Medicine

## 2024-02-11 ENCOUNTER — Ambulatory Visit: Admitting: Family Medicine

## 2024-02-11 VITALS — BP 120/84 | HR 98 | Temp 97.8°F | Ht 70.5 in | Wt 245.0 lb

## 2024-02-11 DIAGNOSIS — E119 Type 2 diabetes mellitus without complications: Secondary | ICD-10-CM | POA: Diagnosis not present

## 2024-02-11 DIAGNOSIS — I10 Essential (primary) hypertension: Secondary | ICD-10-CM

## 2024-02-11 DIAGNOSIS — Z7984 Long term (current) use of oral hypoglycemic drugs: Secondary | ICD-10-CM | POA: Diagnosis not present

## 2024-02-11 DIAGNOSIS — E1169 Type 2 diabetes mellitus with other specified complication: Secondary | ICD-10-CM | POA: Diagnosis not present

## 2024-02-11 DIAGNOSIS — E785 Hyperlipidemia, unspecified: Secondary | ICD-10-CM | POA: Diagnosis not present

## 2024-02-11 LAB — POCT GLYCOSYLATED HEMOGLOBIN (HGB A1C): Hemoglobin A1C: 7.5 % — AB (ref 4.0–5.6)

## 2024-02-11 MED ORDER — TIRZEPATIDE 2.5 MG/0.5ML ~~LOC~~ SOAJ: 2.5000 mg | SUBCUTANEOUS | 0 refills | Status: AC

## 2024-02-11 MED ORDER — TIRZEPATIDE 5 MG/0.5ML ~~LOC~~ SOAJ: 5.0000 mg | SUBCUTANEOUS | 3 refills | Status: AC

## 2024-02-11 NOTE — Patient Instructions (Signed)
 We will try Mounjaro  If the cost is high for this medicine, please call your insurance and ask them what GLP-1 medication they would cover the best.

## 2024-02-12 ENCOUNTER — Other Ambulatory Visit (HOSPITAL_COMMUNITY): Payer: Self-pay

## 2024-02-12 ENCOUNTER — Telehealth: Payer: Self-pay

## 2024-02-12 NOTE — Telephone Encounter (Signed)
 Pharmacy Patient Advocate Encounter  Received notification from [facility] that Prior Authorization for  Mounjaro 2.5MG /0.5ML auto-injectors  has been APPROVED from 02/12/24 to 02/11/25. Ran test claim, Copay is $55.52. This test claim was processed through [facility]- copay amounts may vary at other pharmacies due to pharmacy/plan contracts, or as the patient moves through the different stages of their insurance plan.   PA #/Case ID/Reference #: [id number]

## 2024-02-12 NOTE — Telephone Encounter (Signed)
 Pharmacy Patient Advocate Encounter   Received notification from Onbase that prior authorization for Mounjaro 2.5MG /0.5ML auto-injectors  is required/requested.   Insurance verification completed.   The patient is insured through [facility].   Per test claim: PA required; PA submitted to above mentioned insurance via Latent Key/confirmation #/EOC [id number] Status is pending

## 2024-02-15 ENCOUNTER — Other Ambulatory Visit (HOSPITAL_COMMUNITY): Payer: Self-pay

## 2024-02-18 ENCOUNTER — Other Ambulatory Visit: Payer: Self-pay | Admitting: Family Medicine

## 2024-03-18 ENCOUNTER — Other Ambulatory Visit: Payer: Self-pay | Admitting: Family Medicine

## 2024-03-18 DIAGNOSIS — R110 Nausea: Secondary | ICD-10-CM

## 2024-05-12 ENCOUNTER — Ambulatory Visit: Admitting: Family Medicine

## 2024-12-09 ENCOUNTER — Ambulatory Visit: Admitting: Internal Medicine
